# Patient Record
Sex: Male | Born: 1935
Health system: Southern US, Community
[De-identification: ages and names within clinical notes are randomized; demographics above are authoritative.]

## PROBLEM LIST (undated history)

## (undated) DIAGNOSIS — M109 Gout, unspecified: Secondary | ICD-10-CM

## (undated) DIAGNOSIS — C4499 Other specified malignant neoplasm of skin, unspecified: Secondary | ICD-10-CM

## (undated) DIAGNOSIS — G8929 Other chronic pain: Secondary | ICD-10-CM

## (undated) DIAGNOSIS — Z950 Presence of cardiac pacemaker: Secondary | ICD-10-CM

## (undated) DIAGNOSIS — I4821 Permanent atrial fibrillation: Secondary | ICD-10-CM

## (undated) DIAGNOSIS — M549 Dorsalgia, unspecified: Secondary | ICD-10-CM

## (undated) DIAGNOSIS — E785 Hyperlipidemia, unspecified: Secondary | ICD-10-CM

## (undated) DIAGNOSIS — I10 Essential (primary) hypertension: Secondary | ICD-10-CM

## (undated) DIAGNOSIS — G4733 Obstructive sleep apnea (adult) (pediatric): Secondary | ICD-10-CM

## (undated) DIAGNOSIS — M1990 Unspecified osteoarthritis, unspecified site: Secondary | ICD-10-CM

## (undated) DIAGNOSIS — Z9989 Dependence on other enabling machines and devices: Secondary | ICD-10-CM

## (undated) DIAGNOSIS — I4891 Unspecified atrial fibrillation: Principal | ICD-10-CM

## (undated) HISTORY — DX: Unspecified atrial fibrillation: I48.91

## (undated) HISTORY — PX: CHOLECYSTECTOMY: SHX55

## (undated) HISTORY — DX: Obstructive sleep apnea (adult) (pediatric): G47.33

## (undated) HISTORY — DX: Presence of cardiac pacemaker: Z95.0

## (undated) HISTORY — DX: Essential (primary) hypertension: I10

## (undated) HISTORY — DX: Permanent atrial fibrillation: I48.21

## (undated) HISTORY — PX: COLONOSCOPY: SHX174

## (undated) HISTORY — DX: Dependence on other enabling machines and devices: Z99.89

## (undated) HISTORY — PX: TONSILLECTOMY: SUR1361

## (undated) HISTORY — DX: Hyperlipidemia, unspecified: E78.5

---

## 2006-12-22 HISTORY — PX: GALLBLADDER SURGERY: SHX652

## 2007-05-22 HISTORY — PX: NM MYOCAR PERF WALL MOTION: HXRAD629

## 2007-11-24 HISTORY — PX: US ECHOCARDIOGRAPHY: HXRAD669

## 2009-01-09 HISTORY — PX: PERMANENT PACEMAKER INSERTION: SHX6023

## 2009-10-28 ENCOUNTER — Encounter: Admission: RE | Admit: 2009-10-28 | Discharge: 2009-10-28 | Payer: Self-pay | Admitting: Orthopedic Surgery

## 2010-07-23 HISTORY — PX: OTHER SURGICAL HISTORY: SHX169

## 2010-08-13 ENCOUNTER — Encounter: Payer: Self-pay | Admitting: Orthopedic Surgery

## 2011-08-14 DIAGNOSIS — R791 Abnormal coagulation profile: Secondary | ICD-10-CM | POA: Diagnosis not present

## 2011-08-14 DIAGNOSIS — I4891 Unspecified atrial fibrillation: Secondary | ICD-10-CM | POA: Diagnosis not present

## 2011-08-14 DIAGNOSIS — Z7901 Long term (current) use of anticoagulants: Secondary | ICD-10-CM | POA: Diagnosis not present

## 2011-08-27 DIAGNOSIS — I4891 Unspecified atrial fibrillation: Secondary | ICD-10-CM | POA: Diagnosis not present

## 2011-08-27 DIAGNOSIS — R791 Abnormal coagulation profile: Secondary | ICD-10-CM | POA: Diagnosis not present

## 2011-08-27 DIAGNOSIS — Z7901 Long term (current) use of anticoagulants: Secondary | ICD-10-CM | POA: Diagnosis not present

## 2011-09-06 DIAGNOSIS — Z7901 Long term (current) use of anticoagulants: Secondary | ICD-10-CM | POA: Diagnosis not present

## 2011-09-06 DIAGNOSIS — R791 Abnormal coagulation profile: Secondary | ICD-10-CM | POA: Diagnosis not present

## 2011-09-06 DIAGNOSIS — I4891 Unspecified atrial fibrillation: Secondary | ICD-10-CM | POA: Diagnosis not present

## 2011-09-28 DIAGNOSIS — R791 Abnormal coagulation profile: Secondary | ICD-10-CM | POA: Diagnosis not present

## 2011-09-28 DIAGNOSIS — I4891 Unspecified atrial fibrillation: Secondary | ICD-10-CM | POA: Diagnosis not present

## 2011-09-28 DIAGNOSIS — Z7901 Long term (current) use of anticoagulants: Secondary | ICD-10-CM | POA: Diagnosis not present

## 2011-10-12 DIAGNOSIS — R791 Abnormal coagulation profile: Secondary | ICD-10-CM | POA: Diagnosis not present

## 2011-10-12 DIAGNOSIS — Z7901 Long term (current) use of anticoagulants: Secondary | ICD-10-CM | POA: Diagnosis not present

## 2011-10-12 DIAGNOSIS — I4891 Unspecified atrial fibrillation: Secondary | ICD-10-CM | POA: Diagnosis not present

## 2011-10-22 DIAGNOSIS — I4891 Unspecified atrial fibrillation: Secondary | ICD-10-CM | POA: Diagnosis not present

## 2011-10-22 DIAGNOSIS — Z7901 Long term (current) use of anticoagulants: Secondary | ICD-10-CM | POA: Diagnosis not present

## 2011-10-22 DIAGNOSIS — R791 Abnormal coagulation profile: Secondary | ICD-10-CM | POA: Diagnosis not present

## 2011-11-01 DIAGNOSIS — Z45018 Encounter for adjustment and management of other part of cardiac pacemaker: Secondary | ICD-10-CM | POA: Diagnosis not present

## 2011-11-01 DIAGNOSIS — I119 Hypertensive heart disease without heart failure: Secondary | ICD-10-CM | POA: Diagnosis not present

## 2011-11-01 DIAGNOSIS — Z7901 Long term (current) use of anticoagulants: Secondary | ICD-10-CM | POA: Diagnosis not present

## 2011-11-01 DIAGNOSIS — I4891 Unspecified atrial fibrillation: Secondary | ICD-10-CM | POA: Diagnosis not present

## 2011-11-02 DIAGNOSIS — I119 Hypertensive heart disease without heart failure: Secondary | ICD-10-CM | POA: Diagnosis not present

## 2011-11-02 DIAGNOSIS — I4891 Unspecified atrial fibrillation: Secondary | ICD-10-CM | POA: Diagnosis not present

## 2011-11-02 DIAGNOSIS — E782 Mixed hyperlipidemia: Secondary | ICD-10-CM | POA: Diagnosis not present

## 2011-11-02 DIAGNOSIS — Z7901 Long term (current) use of anticoagulants: Secondary | ICD-10-CM | POA: Diagnosis not present

## 2011-11-08 DIAGNOSIS — L820 Inflamed seborrheic keratosis: Secondary | ICD-10-CM | POA: Diagnosis not present

## 2011-11-08 DIAGNOSIS — Z85828 Personal history of other malignant neoplasm of skin: Secondary | ICD-10-CM | POA: Diagnosis not present

## 2011-11-20 DIAGNOSIS — Z7901 Long term (current) use of anticoagulants: Secondary | ICD-10-CM | POA: Diagnosis not present

## 2011-11-20 DIAGNOSIS — I4891 Unspecified atrial fibrillation: Secondary | ICD-10-CM | POA: Diagnosis not present

## 2011-12-05 DIAGNOSIS — I4891 Unspecified atrial fibrillation: Secondary | ICD-10-CM | POA: Diagnosis not present

## 2011-12-05 DIAGNOSIS — Z7901 Long term (current) use of anticoagulants: Secondary | ICD-10-CM | POA: Diagnosis not present

## 2011-12-20 DIAGNOSIS — Z7901 Long term (current) use of anticoagulants: Secondary | ICD-10-CM | POA: Diagnosis not present

## 2011-12-20 DIAGNOSIS — I4891 Unspecified atrial fibrillation: Secondary | ICD-10-CM | POA: Diagnosis not present

## 2012-01-01 DIAGNOSIS — I4891 Unspecified atrial fibrillation: Secondary | ICD-10-CM | POA: Diagnosis not present

## 2012-01-01 DIAGNOSIS — Z7901 Long term (current) use of anticoagulants: Secondary | ICD-10-CM | POA: Diagnosis not present

## 2012-01-29 DIAGNOSIS — I4891 Unspecified atrial fibrillation: Secondary | ICD-10-CM | POA: Diagnosis not present

## 2012-01-29 DIAGNOSIS — Z7901 Long term (current) use of anticoagulants: Secondary | ICD-10-CM | POA: Diagnosis not present

## 2012-02-13 DIAGNOSIS — Z7901 Long term (current) use of anticoagulants: Secondary | ICD-10-CM | POA: Diagnosis not present

## 2012-02-13 DIAGNOSIS — I4891 Unspecified atrial fibrillation: Secondary | ICD-10-CM | POA: Diagnosis not present

## 2012-02-27 DIAGNOSIS — I4891 Unspecified atrial fibrillation: Secondary | ICD-10-CM | POA: Diagnosis not present

## 2012-02-27 DIAGNOSIS — Z7901 Long term (current) use of anticoagulants: Secondary | ICD-10-CM | POA: Diagnosis not present

## 2012-03-19 DIAGNOSIS — Z7901 Long term (current) use of anticoagulants: Secondary | ICD-10-CM | POA: Diagnosis not present

## 2012-03-19 DIAGNOSIS — I4891 Unspecified atrial fibrillation: Secondary | ICD-10-CM | POA: Diagnosis not present

## 2012-04-21 DIAGNOSIS — Z7901 Long term (current) use of anticoagulants: Secondary | ICD-10-CM | POA: Diagnosis not present

## 2012-04-21 DIAGNOSIS — I4891 Unspecified atrial fibrillation: Secondary | ICD-10-CM | POA: Diagnosis not present

## 2012-04-28 DIAGNOSIS — I83219 Varicose veins of right lower extremity with both ulcer of unspecified site and inflammation: Secondary | ICD-10-CM | POA: Diagnosis not present

## 2012-04-28 DIAGNOSIS — L97929 Non-pressure chronic ulcer of unspecified part of left lower leg with unspecified severity: Secondary | ICD-10-CM | POA: Diagnosis not present

## 2012-04-30 DIAGNOSIS — M5412 Radiculopathy, cervical region: Secondary | ICD-10-CM | POA: Diagnosis not present

## 2012-04-30 DIAGNOSIS — M4802 Spinal stenosis, cervical region: Secondary | ICD-10-CM | POA: Diagnosis not present

## 2012-04-30 DIAGNOSIS — M47812 Spondylosis without myelopathy or radiculopathy, cervical region: Secondary | ICD-10-CM | POA: Diagnosis not present

## 2012-04-30 DIAGNOSIS — G894 Chronic pain syndrome: Secondary | ICD-10-CM | POA: Diagnosis not present

## 2012-05-15 DIAGNOSIS — I4891 Unspecified atrial fibrillation: Secondary | ICD-10-CM | POA: Diagnosis not present

## 2012-05-15 DIAGNOSIS — Z7901 Long term (current) use of anticoagulants: Secondary | ICD-10-CM | POA: Diagnosis not present

## 2012-05-15 DIAGNOSIS — Z45018 Encounter for adjustment and management of other part of cardiac pacemaker: Secondary | ICD-10-CM | POA: Diagnosis not present

## 2012-05-20 DIAGNOSIS — I4891 Unspecified atrial fibrillation: Secondary | ICD-10-CM | POA: Diagnosis not present

## 2012-05-20 DIAGNOSIS — Z7901 Long term (current) use of anticoagulants: Secondary | ICD-10-CM | POA: Diagnosis not present

## 2012-06-03 DIAGNOSIS — H2510 Age-related nuclear cataract, unspecified eye: Secondary | ICD-10-CM | POA: Diagnosis not present

## 2012-06-03 DIAGNOSIS — D3130 Benign neoplasm of unspecified choroid: Secondary | ICD-10-CM | POA: Diagnosis not present

## 2012-06-03 DIAGNOSIS — H5200 Hypermetropia, unspecified eye: Secondary | ICD-10-CM | POA: Diagnosis not present

## 2012-06-03 DIAGNOSIS — H524 Presbyopia: Secondary | ICD-10-CM | POA: Diagnosis not present

## 2012-06-09 DIAGNOSIS — Z23 Encounter for immunization: Secondary | ICD-10-CM | POA: Diagnosis not present

## 2012-06-24 DIAGNOSIS — I4891 Unspecified atrial fibrillation: Secondary | ICD-10-CM | POA: Diagnosis not present

## 2012-06-24 DIAGNOSIS — Z7901 Long term (current) use of anticoagulants: Secondary | ICD-10-CM | POA: Diagnosis not present

## 2012-07-28 DIAGNOSIS — M19029 Primary osteoarthritis, unspecified elbow: Secondary | ICD-10-CM | POA: Diagnosis not present

## 2012-09-23 ENCOUNTER — Other Ambulatory Visit: Payer: Self-pay | Admitting: Physician Assistant

## 2012-09-23 ENCOUNTER — Ambulatory Visit
Admission: RE | Admit: 2012-09-23 | Discharge: 2012-09-23 | Disposition: A | Discharge status: Home / Self Care | Payer: BC Managed Care – PPO | Source: Ambulatory Visit | Attending: Physician Assistant | Admitting: Physician Assistant

## 2012-09-23 DIAGNOSIS — R52 Pain, unspecified: Secondary | ICD-10-CM

## 2012-09-23 DIAGNOSIS — G609 Hereditary and idiopathic neuropathy, unspecified: Secondary | ICD-10-CM | POA: Diagnosis not present

## 2012-09-23 DIAGNOSIS — G894 Chronic pain syndrome: Secondary | ICD-10-CM | POA: Diagnosis not present

## 2012-09-23 DIAGNOSIS — Z79899 Other long term (current) drug therapy: Secondary | ICD-10-CM | POA: Diagnosis not present

## 2012-09-23 DIAGNOSIS — M1990 Unspecified osteoarthritis, unspecified site: Secondary | ICD-10-CM | POA: Diagnosis not present

## 2012-09-23 DIAGNOSIS — M47817 Spondylosis without myelopathy or radiculopathy, lumbosacral region: Secondary | ICD-10-CM | POA: Diagnosis not present

## 2012-09-29 DIAGNOSIS — Z7901 Long term (current) use of anticoagulants: Secondary | ICD-10-CM | POA: Diagnosis not present

## 2012-09-29 DIAGNOSIS — I4891 Unspecified atrial fibrillation: Secondary | ICD-10-CM | POA: Diagnosis not present

## 2012-10-10 ENCOUNTER — Ambulatory Visit: Payer: Self-pay | Admitting: Cardiovascular Disease

## 2012-10-10 DIAGNOSIS — I4891 Unspecified atrial fibrillation: Secondary | ICD-10-CM

## 2012-10-10 DIAGNOSIS — Z7901 Long term (current) use of anticoagulants: Secondary | ICD-10-CM | POA: Insufficient documentation

## 2012-10-10 DIAGNOSIS — I4821 Permanent atrial fibrillation: Secondary | ICD-10-CM | POA: Insufficient documentation

## 2012-10-10 HISTORY — DX: Unspecified atrial fibrillation: I48.91

## 2012-10-13 DIAGNOSIS — M5137 Other intervertebral disc degeneration, lumbosacral region: Secondary | ICD-10-CM | POA: Diagnosis not present

## 2012-10-13 DIAGNOSIS — G894 Chronic pain syndrome: Secondary | ICD-10-CM | POA: Diagnosis not present

## 2012-10-13 DIAGNOSIS — IMO0002 Reserved for concepts with insufficient information to code with codable children: Secondary | ICD-10-CM | POA: Diagnosis not present

## 2012-10-13 DIAGNOSIS — M47817 Spondylosis without myelopathy or radiculopathy, lumbosacral region: Secondary | ICD-10-CM | POA: Diagnosis not present

## 2012-10-30 DIAGNOSIS — I4891 Unspecified atrial fibrillation: Secondary | ICD-10-CM | POA: Diagnosis not present

## 2012-10-30 DIAGNOSIS — G4733 Obstructive sleep apnea (adult) (pediatric): Secondary | ICD-10-CM | POA: Diagnosis not present

## 2012-11-04 ENCOUNTER — Ambulatory Visit
Admission: RE | Admit: 2012-11-04 | Discharge: 2012-11-04 | Disposition: A | Discharge status: Home / Self Care | Payer: Medicare Other | Source: Ambulatory Visit | Attending: Pain Medicine | Admitting: Pain Medicine

## 2012-11-04 ENCOUNTER — Other Ambulatory Visit: Payer: Self-pay | Admitting: Pain Medicine

## 2012-11-04 DIAGNOSIS — M169 Osteoarthritis of hip, unspecified: Secondary | ICD-10-CM | POA: Diagnosis not present

## 2012-11-04 DIAGNOSIS — M25551 Pain in right hip: Secondary | ICD-10-CM

## 2012-11-04 DIAGNOSIS — M25552 Pain in left hip: Secondary | ICD-10-CM

## 2012-11-04 DIAGNOSIS — M5137 Other intervertebral disc degeneration, lumbosacral region: Secondary | ICD-10-CM | POA: Diagnosis not present

## 2012-11-19 DIAGNOSIS — M5137 Other intervertebral disc degeneration, lumbosacral region: Secondary | ICD-10-CM | POA: Diagnosis not present

## 2012-11-27 DIAGNOSIS — M79609 Pain in unspecified limb: Secondary | ICD-10-CM | POA: Diagnosis not present

## 2012-11-27 DIAGNOSIS — M169 Osteoarthritis of hip, unspecified: Secondary | ICD-10-CM | POA: Diagnosis not present

## 2012-11-27 DIAGNOSIS — M47817 Spondylosis without myelopathy or radiculopathy, lumbosacral region: Secondary | ICD-10-CM | POA: Diagnosis not present

## 2012-11-27 DIAGNOSIS — G894 Chronic pain syndrome: Secondary | ICD-10-CM | POA: Diagnosis not present

## 2012-12-16 ENCOUNTER — Ambulatory Visit (INDEPENDENT_AMBULATORY_CARE_PROVIDER_SITE_OTHER): Payer: Medicare Other | Admitting: Pharmacist Clinician (PhC)/ Clinical Pharmacy Specialist

## 2012-12-16 DIAGNOSIS — Z7901 Long term (current) use of anticoagulants: Secondary | ICD-10-CM | POA: Diagnosis not present

## 2012-12-16 DIAGNOSIS — I4891 Unspecified atrial fibrillation: Secondary | ICD-10-CM | POA: Diagnosis not present

## 2012-12-16 LAB — POCT INR: INR: 2.6

## 2013-01-09 ENCOUNTER — Other Ambulatory Visit: Payer: Self-pay | Admitting: Cardiovascular Disease

## 2013-01-19 ENCOUNTER — Ambulatory Visit (INDEPENDENT_AMBULATORY_CARE_PROVIDER_SITE_OTHER): Payer: Self-pay | Admitting: Pharmacist Clinician (PhC)/ Clinical Pharmacy Specialist

## 2013-01-19 DIAGNOSIS — Z7901 Long term (current) use of anticoagulants: Secondary | ICD-10-CM | POA: Diagnosis not present

## 2013-01-19 DIAGNOSIS — I4891 Unspecified atrial fibrillation: Secondary | ICD-10-CM

## 2013-01-19 LAB — POCT INR: INR: 2.9

## 2013-02-10 ENCOUNTER — Ambulatory Visit (INDEPENDENT_AMBULATORY_CARE_PROVIDER_SITE_OTHER): Payer: Self-pay | Admitting: Pharmacist Clinician (PhC)/ Clinical Pharmacy Specialist

## 2013-02-10 DIAGNOSIS — I4891 Unspecified atrial fibrillation: Secondary | ICD-10-CM

## 2013-02-10 DIAGNOSIS — Z7901 Long term (current) use of anticoagulants: Secondary | ICD-10-CM

## 2013-02-10 LAB — POCT INR: INR: 2.6

## 2013-03-12 ENCOUNTER — Telehealth: Payer: Self-pay | Admitting: Pharmacist Clinician (PhC)/ Clinical Pharmacy Specialist

## 2013-03-12 ENCOUNTER — Ambulatory Visit (INDEPENDENT_AMBULATORY_CARE_PROVIDER_SITE_OTHER): Payer: Self-pay | Admitting: Pharmacist Clinician (PhC)/ Clinical Pharmacy Specialist

## 2013-03-12 DIAGNOSIS — Z7901 Long term (current) use of anticoagulants: Secondary | ICD-10-CM

## 2013-03-12 DIAGNOSIS — I4891 Unspecified atrial fibrillation: Secondary | ICD-10-CM

## 2013-03-12 NOTE — Telephone Encounter (Signed)
Patient's INR was 3.4 yesterday.  He did not take 1 pill last night.

## 2013-03-31 ENCOUNTER — Ambulatory Visit (INDEPENDENT_AMBULATORY_CARE_PROVIDER_SITE_OTHER): Payer: Medicare Other | Admitting: Pharmacist Clinician (PhC)/ Clinical Pharmacy Specialist

## 2013-03-31 DIAGNOSIS — I4891 Unspecified atrial fibrillation: Secondary | ICD-10-CM

## 2013-03-31 DIAGNOSIS — Z7901 Long term (current) use of anticoagulants: Secondary | ICD-10-CM

## 2013-03-31 LAB — POCT INR: INR: 3.1

## 2013-04-21 ENCOUNTER — Ambulatory Visit (INDEPENDENT_AMBULATORY_CARE_PROVIDER_SITE_OTHER): Payer: Medicare Other | Admitting: Pharmacist Clinician (PhC)/ Clinical Pharmacy Specialist

## 2013-04-21 ENCOUNTER — Telehealth: Payer: Self-pay | Admitting: Pharmacist Clinician (PhC)/ Clinical Pharmacy Specialist

## 2013-04-21 DIAGNOSIS — I4891 Unspecified atrial fibrillation: Secondary | ICD-10-CM | POA: Diagnosis not present

## 2013-04-21 DIAGNOSIS — Z7901 Long term (current) use of anticoagulants: Secondary | ICD-10-CM

## 2013-04-21 NOTE — Telephone Encounter (Signed)
His INR is 2.9

## 2013-04-25 ENCOUNTER — Encounter: Payer: Self-pay | Admitting: *Deleted

## 2013-04-28 ENCOUNTER — Ambulatory Visit (INDEPENDENT_AMBULATORY_CARE_PROVIDER_SITE_OTHER): Payer: Medicare Other | Admitting: Cardiovascular Disease

## 2013-04-28 ENCOUNTER — Encounter: Payer: Self-pay | Admitting: Cardiovascular Disease

## 2013-04-28 VITALS — BP 118/72 | HR 63 | Resp 16 | Ht 77.0 in | Wt 321.6 lb

## 2013-04-28 DIAGNOSIS — E669 Obesity, unspecified: Secondary | ICD-10-CM

## 2013-04-28 DIAGNOSIS — E782 Mixed hyperlipidemia: Secondary | ICD-10-CM

## 2013-04-28 DIAGNOSIS — Z79899 Other long term (current) drug therapy: Secondary | ICD-10-CM

## 2013-04-28 DIAGNOSIS — Z950 Presence of cardiac pacemaker: Secondary | ICD-10-CM | POA: Diagnosis not present

## 2013-04-28 DIAGNOSIS — G4733 Obstructive sleep apnea (adult) (pediatric): Secondary | ICD-10-CM

## 2013-04-28 DIAGNOSIS — R197 Diarrhea, unspecified: Secondary | ICD-10-CM

## 2013-04-28 DIAGNOSIS — I10 Essential (primary) hypertension: Secondary | ICD-10-CM

## 2013-04-28 DIAGNOSIS — I4891 Unspecified atrial fibrillation: Secondary | ICD-10-CM

## 2013-04-28 DIAGNOSIS — E785 Hyperlipidemia, unspecified: Secondary | ICD-10-CM

## 2013-04-28 LAB — PACEMAKER DEVICE OBSERVATION
BATTERY VOLTAGE: 2.79 V
BRDY-0002RV: 60 {beats}/min
DEVICE MODEL PM: 7043529
RV LEAD AMPLITUDE: 12 mv
RV LEAD THRESHOLD: 0.5 V

## 2013-04-28 MED ORDER — METOPROLOL TARTRATE 25 MG PO TABS: 12.5000 mg | ORAL_TABLET | Freq: Two times a day (BID) | ORAL | Status: DC

## 2013-04-28 MED ORDER — COUMADIN 5 MG PO TABS: ORAL_TABLET | ORAL | Status: DC

## 2013-04-28 MED ORDER — ATORVASTATIN CALCIUM 20 MG PO TABS: 20.0000 mg | ORAL_TABLET | Freq: Every day | ORAL | Status: DC

## 2013-04-28 MED ORDER — EZETIMIBE 10 MG PO TABS: 10.0000 mg | ORAL_TABLET | Freq: Every day | ORAL | Status: DC

## 2013-04-28 MED ORDER — VALSARTAN-HYDROCHLOROTHIAZIDE 160-12.5 MG PO TABS: 1.0000 | ORAL_TABLET | Freq: Every day | ORAL | Status: DC

## 2013-04-28 NOTE — Patient Instructions (Addendum)
Your physician has recommended you make the following change in your medication: Discontinue Zetia  Your physician recommends that you return for lab work in: 1 month (CMP/LIPID)  Your physician recommends that you schedule a follow-up appointment in: 6 MONTHS

## 2013-04-30 ENCOUNTER — Encounter: Payer: Self-pay | Admitting: Cardiovascular Disease

## 2013-05-01 ENCOUNTER — Encounter: Payer: Self-pay | Admitting: Cardiovascular Disease

## 2013-05-01 DIAGNOSIS — G4733 Obstructive sleep apnea (adult) (pediatric): Secondary | ICD-10-CM | POA: Insufficient documentation

## 2013-05-01 DIAGNOSIS — E785 Hyperlipidemia, unspecified: Secondary | ICD-10-CM | POA: Insufficient documentation

## 2013-05-01 DIAGNOSIS — I10 Essential (primary) hypertension: Secondary | ICD-10-CM

## 2013-05-01 DIAGNOSIS — Z950 Presence of cardiac pacemaker: Secondary | ICD-10-CM

## 2013-05-01 DIAGNOSIS — R197 Diarrhea, unspecified: Secondary | ICD-10-CM | POA: Insufficient documentation

## 2013-05-01 HISTORY — DX: Essential (primary) hypertension: I10

## 2013-05-01 HISTORY — DX: Presence of cardiac pacemaker: Z95.0

## 2013-05-01 NOTE — Assessment & Plan Note (Signed)
Weight loss recommended 

## 2013-05-01 NOTE — Assessment & Plan Note (Signed)
Pacemaker check in clinic. Threshold, sensing, and impedance consistent with previous measurements. Device programmed to maximize longevity. Permanent AF,  No high ventricular rates noted. Histogram distribution appropriate for patient activity level. Patient presented to the office with a polarity switch to unipolar sense/pulse. Testing was performed in both the unipolar and bipolar fashion and there were no problems with the bipolar function. Patient's device was reprogrammed back to its original bipolar configuration. V. auto cap was D/C'd and a permanent output of 2V at 0.13ms was programmed. Estimated longevity was increased to >10 years after changes. Patient will follow upin 6 months.

## 2013-05-01 NOTE — Assessment & Plan Note (Addendum)
On appropriate warfarin anti-coagulation. Anticoagulations levels have been quite steady. No bleeding complications. No history of stroke or TIA. Roughly 60% ventricular pacing due to slow ventricular response.

## 2013-05-01 NOTE — Assessment & Plan Note (Signed)
Theoretically could be related to treatment with aphakia. Will discontinue this medication and recheck a lipid profile. If diarrhea resolves I would not restart this medication. If diarrhea persists and lipid profile shows need for additional lipid lowering I would restart it. Labs ordered today to be performed in several weeks.

## 2013-05-01 NOTE — Assessment & Plan Note (Signed)
Repeat the lipid profile once Zetia effect dissipates.

## 2013-05-01 NOTE — Assessment & Plan Note (Signed)
Excellent control.   

## 2013-05-01 NOTE — Assessment & Plan Note (Signed)
Reports virtually 100% compliance with CPAP

## 2013-05-01 NOTE — Progress Notes (Signed)
Patient ID: Cameron Sharp, male   DOB: 1935/10/19, 77 y.o.   MRN: [id number]        Reason for office visit Atrial fibrillation, pacemaker, hyperlipidemia, hypertension  [name] returns for routine followup. He has permanent atrial fibrillation with slow ventricular response and a single chamber [employer] pacemaker. He has not had stroke/TIA or any bleeding complications while on chronic warfarin therapy. He has not had any major adverse health events, but has been plagued by persistent diarrhea for about a year. He wonders whether it could be medication related. He denies palpitations, syncope, dyspnea, dizziness, fatigue, chest pain, edema.  A full pacemaker check was performed in the office today. The device has switched to unipolar mode because of a faulty interpretation of pacing thresholds via the auto capture protocol. His ventricular pacing threshold is actually excellent. Also capture turned off. The device was reprogrammed to bipolar mode with fixed pacing output well above the threshold.      No Known Allergies  Current Outpatient Prescriptions  Medication Sig Dispense Refill  . atorvastatin (LIPITOR) 20 MG tablet Take 1 tablet (20 mg total) by mouth daily.  90 tablet  3  . COUMADIN 5 MG tablet Take one tablet daily or as directed.  90 tablet  2  . cyclobenzaprine (FLEXERIL) 5 MG tablet Take 5 mg by mouth at bedtime as needed for muscle spasms.      Marland Kitchen ezetimibe (ZETIA) 10 MG tablet Take 1 tablet (10 mg total) by mouth daily.  90 tablet  3  . gabapentin (NEURONTIN) 300 MG capsule Take 900 mg by mouth daily.       Marland Kitchen HYDROcodone-acetaminophen (NORCO) 10-325 MG per tablet Take 1 tablet by mouth every 6 (six) hours as needed for pain.      Marland Kitchen l-methylfolate-B6-B12 (METANX) 3-35-2 MG TABS Take 1 tablet by mouth daily.      . metoprolol tartrate (LOPRESSOR) 25 MG tablet Take 0.5 tablets (12.5 mg total) by mouth 2 (two) times daily.  90 tablet  3  . Thiamine HCl (VITAMIN B-1) 250  MG tablet Take 250 mg by mouth daily.      . valsartan-hydrochlorothiazide (DIOVAN-HCT) 160-12.5 MG per tablet Take 1 tablet by mouth daily.  90 tablet  3   No current facility-administered medications for this visit.    Past Medical History  Diagnosis Date  . Permanent atrial fibrillation   . Hypertension   . OSA on CPAP   . Hyperlipidemia   . Pacemaker -single-chamber [employer] Jude Zephyr 2010 05/01/2013  . Atrial fibrillation 10/10/2012  . HTN (hypertension) 05/01/2013    Past Surgical History  Procedure Laterality Date  . Gallbladder surgery  12/2006  . Permanent pacemaker insertion  01/09/2009    [employer].Jude  . US echocardiography  11/24/07    mild LVH,LA & RA mod to severely dilated,mild mitral annular ca+,mild TR,mild Pulmonary hypertensio,AOV mod. sclerotic,mild AI w/root dilatation and ca+.  . Nm myocar perf wall motion  05/22/07    no significant ischemia    Family History  Problem Relation Age of Onset  . Cancer Father     History   Social History  . Marital Status: Married    Spouse Name: N/A    Number of Children: N/A  . Years of Education: N/A   Occupational History  . Not on file.   Social History Main Topics  . Smoking status: Former Smoker    Quit date: 07/23/1971  . Smokeless tobacco: Not on file  .  Alcohol Use: Yes  . Drug Use: No  . Sexual Activity: Not on file   Other Topics Concern  . Not on file   Social History Narrative  . No narrative on file    Review of systems: The patient specifically denies any chest pain at rest or with exertion, dyspnea at rest or with exertion, orthopnea, paroxysmal nocturnal dyspnea, syncope, palpitations, focal neurological deficits, intermittent claudication, lower extremity edema, unexplained weight gain, cough, hemoptysis or wheezing.  The patient also denies abdominal pain, nausea, vomiting, dysphagia, constipation, polyuria, polydipsia, dysuria, hematuria, frequency, urgency, abnormal bleeding or bruising,  fever, chills, unexpected weight changes, mood swings, change in skin or hair texture, change in voice quality, auditory or visual problems, allergic reactions or rashes, new musculoskeletal complaints other than usual "aches and pains".   PHYSICAL EXAM BP 118/72  Pulse 63  Resp 16  Ht 6\' 5"  (1.956 m)  Wt 321 lb 9.6 oz (145.877 kg)  BMI 38.13 kg/m2  General: Alert, oriented x3, no distress, moderately obese Head: no evidence of trauma, PERRL, EOMI, no exophtalmos or lid lag, no myxedema, no xanthelasma; normal ears, nose and oropharynx Neck: normal jugular venous pulsations and no hepatojugular reflux; brisk carotid pulses without delay and no carotid bruits Chest: clear to auscultation, no signs of consolidation by percussion or palpation, normal fremitus, symmetrical and full respiratory excursions Cardiovascular: normal position and quality of the apical impulse, irregular rhythm, normal first and second heart sounds, no murmurs, rubs or gallops Abdomen: no tenderness or distention, no masses by palpation, no abnormal pulsatility or arterial bruits, normal bowel sounds, no hepatosplenomegaly Extremities: no clubbing, cyanosis or edema; 2+ radial, ulnar and brachial pulses bilaterally; 2+ right femoral, posterior tibial and dorsalis pedis pulses; 2+ left femoral, posterior tibial and dorsalis pedis pulses; no subclavian or femoral bruits Neurological: grossly nonfocal   EKG: Atrial fibrillation with intermittent ventricular pacing  Lipid Panel  Most recent triglycerides 105, cholesterol 161, HDL 49, LDL 91 but this is dated 2 2012  BMET No results found for this basename: na, k, cl, co2, glucose, bun, creatinine, calcium, gfrnonaa, gfraa     ASSESSMENT AND PLAN Atrial fibrillation On appropriate warfarin anti-coagulation. Anticoagulations levels have been quite steady. No bleeding complications. No history of stroke or TIA. Roughly 60% ventricular pacing due to slow ventricular  response.  Diarrhea Theoretically could be related to treatment with aphakia. Will discontinue this medication and recheck a lipid profile. If diarrhea resolves I would not restart this medication. If diarrhea persists and lipid profile shows need for additional lipid lowering I would restart it. Labs ordered today to be performed in several weeks.  Pacemaker -single-chamber St[employer] Jude Zephyr 2010 Pacemaker check in clinic. Threshold, sensing, and impedance consistent with previous measurements. Device programmed to maximize longevity. Permanent AF,  No high ventricular rates noted. Histogram distribution appropriate for patient activity level. Patient presented to the office with a polarity switch to unipolar sense/pulse. Testing was performed in both the unipolar and bipolar fashion and there were no problems with the bipolar function. Patient's device was reprogrammed back to its original bipolar configuration. V. auto cap was D/C'd and a permanent output of 2V at 0.5ms was programmed. Estimated longevity was increased to >10 years after changes. Patient will follow upin 6 months.   OSA on CPAP Reports virtually 100% compliance with CPAP  HTN (hypertension) Excellent control.  Obesity (BMI 30.0-34.9) Weight loss recommended.  Hyperlipidemia Repeat the lipid profile once Zetia effect dissipates.  Orders Placed This  Encounter  Procedures  . Comp Met (CMET)  . Lipid Profile  . Pacemaker Device Observation  . EKG 12-Lead   Meds ordered this encounter  Medications  . l-methylfolate-B6-B12 (METANX) 3-35-2 MG TABS    Sig: Take 1 tablet by mouth daily.  Marland Kitchen HYDROcodone-acetaminophen (NORCO) 10-325 MG per tablet    Sig: Take 1 tablet by mouth every 6 (six) hours as needed for pain.  . Thiamine HCl (VITAMIN B-1) 250 MG tablet    Sig: Take 250 mg by mouth daily.  Marland Kitchen atorvastatin (LIPITOR) 20 MG tablet    Sig: Take 1 tablet (20 mg total) by mouth daily.    Dispense:  90 tablet    Refill:  3    . ezetimibe (ZETIA) 10 MG tablet    Sig: Take 1 tablet (10 mg total) by mouth daily.    Dispense:  90 tablet    Refill:  3  . metoprolol tartrate (LOPRESSOR) 25 MG tablet    Sig: Take 0.5 tablets (12.5 mg total) by mouth 2 (two) times daily.    Dispense:  90 tablet    Refill:  3  . valsartan-hydrochlorothiazide (DIOVAN-HCT) 160-12.5 MG per tablet    Sig: Take 1 tablet by mouth daily.    Dispense:  90 tablet    Refill:  3  . COUMADIN 5 MG tablet    Sig: Take one tablet daily or as directed.    Dispense:  90 tablet    Refill:  2    Michell Kader  Thurmon Fair, MD, [facility] [phone] office [phone] pager

## 2013-05-04 ENCOUNTER — Encounter: Payer: Self-pay | Admitting: Cardiovascular Disease

## 2013-05-08 DIAGNOSIS — Z23 Encounter for immunization: Secondary | ICD-10-CM | POA: Diagnosis not present

## 2013-05-13 DIAGNOSIS — L723 Sebaceous cyst: Secondary | ICD-10-CM | POA: Diagnosis not present

## 2013-05-13 DIAGNOSIS — I8310 Varicose veins of unspecified lower extremity with inflammation: Secondary | ICD-10-CM | POA: Diagnosis not present

## 2013-05-13 DIAGNOSIS — D235 Other benign neoplasm of skin of trunk: Secondary | ICD-10-CM | POA: Diagnosis not present

## 2013-05-13 DIAGNOSIS — D239 Other benign neoplasm of skin, unspecified: Secondary | ICD-10-CM | POA: Diagnosis not present

## 2013-05-13 DIAGNOSIS — I8390 Asymptomatic varicose veins of unspecified lower extremity: Secondary | ICD-10-CM | POA: Diagnosis not present

## 2013-05-13 DIAGNOSIS — L909 Atrophic disorder of skin, unspecified: Secondary | ICD-10-CM | POA: Diagnosis not present

## 2013-05-13 DIAGNOSIS — L821 Other seborrheic keratosis: Secondary | ICD-10-CM | POA: Diagnosis not present

## 2013-05-19 ENCOUNTER — Ambulatory Visit (INDEPENDENT_AMBULATORY_CARE_PROVIDER_SITE_OTHER): Payer: Medicare Other | Admitting: Pharmacist Clinician (PhC)/ Clinical Pharmacy Specialist

## 2013-05-19 ENCOUNTER — Telehealth: Payer: Self-pay | Admitting: Pharmacist Clinician (PhC)/ Clinical Pharmacy Specialist

## 2013-05-19 ENCOUNTER — Telehealth: Payer: Self-pay | Admitting: Cardiovascular Disease

## 2013-05-19 DIAGNOSIS — I4891 Unspecified atrial fibrillation: Secondary | ICD-10-CM

## 2013-05-19 DIAGNOSIS — Z7901 Long term (current) use of anticoagulants: Secondary | ICD-10-CM

## 2013-05-19 LAB — POCT INR: INR: 1.4

## 2013-05-19 NOTE — Telephone Encounter (Signed)
Misty Stanley w/ [facility] reported critical INR 1.4 today.  Message forwarded to K. Alvstad, PharmD.

## 2013-05-19 NOTE — Telephone Encounter (Signed)
Cameron Sharp reported critical INR 1.4.  Informed result has been reported to our pharmacist.  Verbalized understanding.

## 2013-05-19 NOTE — Telephone Encounter (Signed)
INR was 1.4 this morning.

## 2013-05-20 DIAGNOSIS — M5137 Other intervertebral disc degeneration, lumbosacral region: Secondary | ICD-10-CM | POA: Diagnosis not present

## 2013-05-20 DIAGNOSIS — Z79899 Other long term (current) drug therapy: Secondary | ICD-10-CM | POA: Diagnosis not present

## 2013-05-20 DIAGNOSIS — M2550 Pain in unspecified joint: Secondary | ICD-10-CM | POA: Diagnosis not present

## 2013-05-20 DIAGNOSIS — G894 Chronic pain syndrome: Secondary | ICD-10-CM | POA: Diagnosis not present

## 2013-05-28 ENCOUNTER — Ambulatory Visit (INDEPENDENT_AMBULATORY_CARE_PROVIDER_SITE_OTHER): Payer: Medicare Other | Admitting: Pharmacist Clinician (PhC)/ Clinical Pharmacy Specialist

## 2013-05-28 ENCOUNTER — Other Ambulatory Visit: Payer: Self-pay

## 2013-05-28 DIAGNOSIS — I4891 Unspecified atrial fibrillation: Secondary | ICD-10-CM

## 2013-05-28 DIAGNOSIS — Z7901 Long term (current) use of anticoagulants: Secondary | ICD-10-CM

## 2013-05-28 NOTE — Telephone Encounter (Signed)
See anticoag note

## 2013-05-29 DIAGNOSIS — E782 Mixed hyperlipidemia: Secondary | ICD-10-CM | POA: Diagnosis not present

## 2013-05-29 DIAGNOSIS — Z79899 Other long term (current) drug therapy: Secondary | ICD-10-CM | POA: Diagnosis not present

## 2013-05-29 LAB — COMPREHENSIVE METABOLIC PANEL
AST: 25 U/L (ref 0–37)
Creat: 1.12 mg/dL (ref 0.50–1.35)
Potassium: 4.9 mEq/L (ref 3.5–5.3)
Sodium: 142 mEq/L (ref 135–145)
Total Bilirubin: 1 mg/dL (ref 0.3–1.2)

## 2013-05-29 LAB — LIPID PANEL
LDL Cholesterol: 76 mg/dL (ref 0–99)
Total CHOL/HDL Ratio: 3.1 Ratio
Triglycerides: 104 mg/dL (ref <150)

## 2013-06-01 DIAGNOSIS — R197 Diarrhea, unspecified: Secondary | ICD-10-CM | POA: Diagnosis not present

## 2013-06-02 ENCOUNTER — Telehealth: Payer: Self-pay | Admitting: Cardiology

## 2013-06-02 NOTE — Telephone Encounter (Signed)
Pt called upset about problems with his C-pap and with the fact that he was told the  C-pap company ([facility]  [phone] been calling our office and has not had any return calls. I told him I would forward this to Dr Landry Dyke nurse.   Corine Shelter PA-C 06/02/2013 7:28 PM

## 2013-06-03 ENCOUNTER — Telehealth: Payer: Self-pay | Admitting: *Deleted

## 2013-06-03 NOTE — Telephone Encounter (Signed)
Spoke with patient informing him the form for his CPAP supplies was faxed back to [employer]. Patient informs me that they also need copies of his sleep studies and last office note. I will copy these documents and send to them as well.

## 2013-06-09 ENCOUNTER — Telehealth: Payer: Self-pay | Admitting: Cardiovascular Disease

## 2013-06-09 NOTE — Telephone Encounter (Signed)
Message forwarded to W. Waddell, CMA.  

## 2013-06-09 NOTE — Telephone Encounter (Signed)
Pt called regarding his new CPAP machine. He stated that [facility] still has not received any of the following sleep study, order, and chart notes. The patient asked if that can be sent over to them and if there is an issue to please call him. He has been waiting for this machine for a while and he is trying to figure out what the hold up is.

## 2013-06-09 NOTE — Telephone Encounter (Signed)
Please have Burna Mortimer call him regarding his C Pac please.

## 2013-06-09 NOTE — Telephone Encounter (Signed)
Spoke with patient assuring him that I have already sent the requested information to [facility]. I will resend it again.

## 2013-06-19 LAB — POCT INR: INR: 1.7

## 2013-06-22 ENCOUNTER — Ambulatory Visit (INDEPENDENT_AMBULATORY_CARE_PROVIDER_SITE_OTHER): Payer: Medicare Other | Admitting: Pharmacist Clinician (PhC)/ Clinical Pharmacy Specialist

## 2013-06-22 DIAGNOSIS — I4891 Unspecified atrial fibrillation: Secondary | ICD-10-CM

## 2013-06-22 DIAGNOSIS — Z7901 Long term (current) use of anticoagulants: Secondary | ICD-10-CM

## 2013-07-20 ENCOUNTER — Telehealth: Payer: Self-pay | Admitting: Cardiovascular Disease

## 2013-07-20 ENCOUNTER — Ambulatory Visit (INDEPENDENT_AMBULATORY_CARE_PROVIDER_SITE_OTHER): Payer: Medicare Other | Admitting: Pharmacist Clinician (PhC)/ Clinical Pharmacy Specialist

## 2013-07-20 DIAGNOSIS — Z7901 Long term (current) use of anticoagulants: Secondary | ICD-10-CM

## 2013-07-20 DIAGNOSIS — I4891 Unspecified atrial fibrillation: Secondary | ICD-10-CM | POA: Diagnosis not present

## 2013-07-20 LAB — POCT INR: INR: 3.3

## 2013-07-20 NOTE — Telephone Encounter (Signed)
Message forwarded to K. Alvstad, PharmD.  

## 2013-07-20 NOTE — Telephone Encounter (Signed)
Calling to report his INR

## 2013-07-20 NOTE — Telephone Encounter (Signed)
See anticoag encounter

## 2013-07-24 ENCOUNTER — Encounter: Payer: Self-pay | Admitting: Cardiology

## 2013-07-24 ENCOUNTER — Ambulatory Visit (INDEPENDENT_AMBULATORY_CARE_PROVIDER_SITE_OTHER): Payer: Medicare Other | Admitting: Cardiology

## 2013-07-24 VITALS — BP 110/78 | HR 61 | Ht 76.0 in | Wt 310.2 lb

## 2013-07-24 DIAGNOSIS — I482 Chronic atrial fibrillation, unspecified: Secondary | ICD-10-CM

## 2013-07-24 DIAGNOSIS — I4891 Unspecified atrial fibrillation: Secondary | ICD-10-CM | POA: Diagnosis not present

## 2013-07-24 DIAGNOSIS — I10 Essential (primary) hypertension: Secondary | ICD-10-CM

## 2013-07-24 DIAGNOSIS — E669 Obesity, unspecified: Secondary | ICD-10-CM

## 2013-07-24 DIAGNOSIS — I4821 Permanent atrial fibrillation: Secondary | ICD-10-CM

## 2013-07-24 DIAGNOSIS — Z950 Presence of cardiac pacemaker: Secondary | ICD-10-CM | POA: Diagnosis not present

## 2013-07-24 DIAGNOSIS — L02419 Cutaneous abscess of limb, unspecified: Secondary | ICD-10-CM | POA: Diagnosis not present

## 2013-07-24 DIAGNOSIS — G4733 Obstructive sleep apnea (adult) (pediatric): Secondary | ICD-10-CM

## 2013-07-24 DIAGNOSIS — Z9989 Dependence on other enabling machines and devices: Secondary | ICD-10-CM

## 2013-07-24 DIAGNOSIS — L03119 Cellulitis of unspecified part of limb: Secondary | ICD-10-CM

## 2013-07-24 DIAGNOSIS — E785 Hyperlipidemia, unspecified: Secondary | ICD-10-CM

## 2013-07-24 DIAGNOSIS — L03116 Cellulitis of left lower limb: Secondary | ICD-10-CM | POA: Insufficient documentation

## 2013-07-24 MED ORDER — CEPHALEXIN 500 MG PO CAPS: 500.0000 mg | ORAL_CAPSULE | Freq: Four times a day (QID) | ORAL | Status: AC

## 2013-07-24 MED ORDER — CEPHALEXIN 500 MG PO CAPS: 500.0000 mg | ORAL_CAPSULE | Freq: Four times a day (QID) | ORAL | Status: DC

## 2013-07-24 NOTE — Assessment & Plan Note (Addendum)
Controlled.  

## 2013-07-24 NOTE — Patient Instructions (Signed)
Keflex 500 mg 4 times a day X 7 days. Keep appointment with Dr Claiborne Billings next week.

## 2013-07-24 NOTE — Progress Notes (Signed)
07/24/2013 Cameron Sharp   1936/03/15  [id number]  Primary Physicia [name], MD Primary Cardiologist: Dr Kelly/Dr Sallyanne Kuster  HPI:  The patient is a 78 year old gentleman with a longstanding history of permanent atrial fibrillation and slow ventricular response who has a single-chamber permanent pacemaker ([place]) that was implanted in 2010. He is on chronic anticoagulation. He has no history of CAD. He had a low risk Myoview in 2008 and an echo in 2009 revealed an EF of 45-50%. Additional problems include obesity, systemic hypertension, obstructive sleep apnea (compliant with CPAP) and hyperlipidemia. He has had problems with lower extremity edema that has been controlled with compression stockings. He presented to the office today for evaluation of Lt LE redness, pain, and erythema after a minor injury last week. The pt says he bumped his shin while he was at the [place] in [place] with his 68 y/o father. The site became red, warm, and tender. He denies any fever of chills.    Current Outpatient Prescriptions  Medication Sig Dispense Refill  . atorvastatin (LIPITOR) 20 MG tablet Take 1 tablet (20 mg total) by mouth daily.  90 tablet  3  . COUMADIN 5 MG tablet Take one tablet daily or as directed.  90 tablet  2  . gabapentin (NEURONTIN) 300 MG capsule Take 900 mg by mouth daily.       Marland Kitchen HYDROcodone-acetaminophen (NORCO) 10-325 MG per tablet Take 1 tablet by mouth every 6 (six) hours as needed for pain.      Marland Kitchen l-methylfolate-B6-B12 (METANX) 3-35-2 MG TABS Take 1 tablet by mouth daily.      . metoprolol tartrate (LOPRESSOR) 25 MG tablet Take 0.5 tablets (12.5 mg total) by mouth 2 (two) times daily.  90 tablet  3  . Thiamine HCl (VITAMIN B-1) 250 MG tablet Take 250 mg by mouth daily.      . valsartan-hydrochlorothiazide (DIOVAN-HCT) 160-12.5 MG per tablet Take 1 tablet by mouth daily.  90 tablet  3  . cephALEXin (KEFLEX) 500 MG capsule Take 1 capsule (500 mg total) by mouth 4 (four) times  daily.  28 capsule  0   No current facility-administered medications for this visit.    No Known Allergies  History   Social History  . Marital Status: Married    Spouse Name: N/A    Number of Children: N/A  . Years of Education: N/A   Occupational History  . Not on file.   Social History Main Topics  . Smoking status: Former Smoker    Quit date: 07/23/1971  . Smokeless tobacco: Not on file  . Alcohol Use: Yes  . Drug Use: No  . Sexual Activity: Not on file   Other Topics Concern  . Not on file   Social History Narrative  . No narrative on file     Review of Systems: General: negative for chills, fever, night sweats or weight changes.  Cardiovascular: negative for chest pain, dyspnea on exertion, edema, orthopnea, palpitations, paroxysmal nocturnal dyspnea or shortness of breath Dermatological: negative for rash Respiratory: negative for cough or wheezing Urologic: negative for hematuria Abdominal: negative for nausea, vomiting, diarrhea, bright red blood per rectum, melena, or hematemesis Neurologic: negative for visual changes, syncope, or dizziness All other systems reviewed and are otherwise negative except as noted above.    Blood pressure 110/78, pulse 61, height 6\' 4"  (1.93 m), weight 310 lb 3.2 oz (140.706 kg).  General appearance: alert, cooperative, no distress and moderately obese Lungs: clear to auscultation bilaterally  Heart: irregularly irregular rhythm Extremities: His Rt LE has chronic venous changes and skin discoloration. His Lt LE has errythema, warmth and is tender and slightly swollen.  EKG AF, POD  ASSESSMENT AND PLAN:   Cellulitis of left leg After minor injury last week  Long term (current) use of anticoagulants .  Permanent atrial fibrillation Rate controlled  Pacemaker -single-chamber [employer] Jude Zephyr 2010 .  HTN (hypertension) Controlled  Obesity, Class II, BMI 35-39.9 .  OSA on CPAP .  Hyperlipidemia LDL 76 Nov  2014   PLAN  Keflex 500 mg QID X 7 days. He has an appointment with Dr Claiborne Billings next week and his leg can be checked then.  [facility] KPA-C 07/24/2013 12:51 PM

## 2013-07-24 NOTE — Assessment & Plan Note (Signed)
Rate controlled 

## 2013-07-24 NOTE — Assessment & Plan Note (Signed)
After minor injury last week

## 2013-07-24 NOTE — Assessment & Plan Note (Signed)
LDL 76 Nov 2014

## 2013-07-27 ENCOUNTER — Ambulatory Visit (INDEPENDENT_AMBULATORY_CARE_PROVIDER_SITE_OTHER): Payer: Medicare Other | Admitting: Pharmacist Clinician (PhC)/ Clinical Pharmacy Specialist

## 2013-07-27 DIAGNOSIS — I4821 Permanent atrial fibrillation: Secondary | ICD-10-CM

## 2013-07-27 DIAGNOSIS — I4891 Unspecified atrial fibrillation: Secondary | ICD-10-CM

## 2013-07-27 DIAGNOSIS — Z7901 Long term (current) use of anticoagulants: Secondary | ICD-10-CM

## 2013-07-27 LAB — POCT INR: INR: 1.6

## 2013-07-29 ENCOUNTER — Encounter: Payer: Self-pay | Admitting: Cardiovascular Disease

## 2013-07-29 ENCOUNTER — Ambulatory Visit (INDEPENDENT_AMBULATORY_CARE_PROVIDER_SITE_OTHER): Payer: Medicare Other | Admitting: Cardiovascular Disease

## 2013-07-29 VITALS — BP 131/62 | HR 76 | Ht 75.0 in | Wt 310.0 lb

## 2013-07-29 DIAGNOSIS — I4891 Unspecified atrial fibrillation: Secondary | ICD-10-CM

## 2013-07-29 DIAGNOSIS — G4733 Obstructive sleep apnea (adult) (pediatric): Secondary | ICD-10-CM | POA: Diagnosis not present

## 2013-07-29 DIAGNOSIS — Z9989 Dependence on other enabling machines and devices: Secondary | ICD-10-CM

## 2013-07-29 DIAGNOSIS — I10 Essential (primary) hypertension: Secondary | ICD-10-CM

## 2013-07-29 DIAGNOSIS — E669 Obesity, unspecified: Secondary | ICD-10-CM

## 2013-07-29 DIAGNOSIS — Z950 Presence of cardiac pacemaker: Secondary | ICD-10-CM

## 2013-07-29 DIAGNOSIS — I4821 Permanent atrial fibrillation: Secondary | ICD-10-CM

## 2013-07-29 DIAGNOSIS — Z7901 Long term (current) use of anticoagulants: Secondary | ICD-10-CM

## 2013-07-29 DIAGNOSIS — E785 Hyperlipidemia, unspecified: Secondary | ICD-10-CM

## 2013-07-29 NOTE — Patient Instructions (Signed)
Your physician recommends that you schedule a follow-up appointment in: April or May. No changes were made today in your therapy.

## 2013-08-11 ENCOUNTER — Ambulatory Visit (INDEPENDENT_AMBULATORY_CARE_PROVIDER_SITE_OTHER): Payer: Medicare Other | Admitting: Pharmacist Clinician (PhC)/ Clinical Pharmacy Specialist

## 2013-08-11 DIAGNOSIS — I4891 Unspecified atrial fibrillation: Secondary | ICD-10-CM

## 2013-08-11 DIAGNOSIS — I4821 Permanent atrial fibrillation: Secondary | ICD-10-CM

## 2013-08-11 DIAGNOSIS — Z7901 Long term (current) use of anticoagulants: Secondary | ICD-10-CM

## 2013-08-11 LAB — POCT INR: INR: 2.8

## 2013-08-12 ENCOUNTER — Telehealth: Payer: Self-pay | Admitting: *Deleted

## 2013-08-12 DIAGNOSIS — Z79899 Other long term (current) drug therapy: Secondary | ICD-10-CM | POA: Diagnosis not present

## 2013-08-12 DIAGNOSIS — M5030 Other cervical disc degeneration, unspecified cervical region: Secondary | ICD-10-CM | POA: Diagnosis not present

## 2013-08-12 DIAGNOSIS — M5137 Other intervertebral disc degeneration, lumbosacral region: Secondary | ICD-10-CM | POA: Diagnosis not present

## 2013-08-12 DIAGNOSIS — G894 Chronic pain syndrome: Secondary | ICD-10-CM | POA: Diagnosis not present

## 2013-08-12 NOTE — Telephone Encounter (Signed)
Pt called stating that he needs a refill on Lipitor to be called into [facility] since there is no more refills allowed. TK

## 2013-08-13 MED ORDER — ATORVASTATIN CALCIUM 20 MG PO TABS: 20.0000 mg | ORAL_TABLET | Freq: Every day | ORAL | Status: DC

## 2013-08-13 NOTE — Telephone Encounter (Signed)
Done.  Rx was sent to pharmacy electronically.

## 2013-08-18 ENCOUNTER — Encounter: Payer: Self-pay | Admitting: Cardiovascular Disease

## 2013-08-18 NOTE — Progress Notes (Signed)
Patient ID: Cameron Sharp, male   DOB: 21-Mar-1936, 78 y.o.   MRN: [id number]     HPI: Cameron Sharp, is a 78 y.o. male who presents to sleep clinic for evaluation of his obstructive sleep apnea and followup of his new CPAP machine.  Cameron Sharp has a history of permanent atrial fibrillation, on home Coumadin therapy, hypertension, hyperlipidemia, and obesity. In July 2010 he underwent insertion of a [employer]. Jude permanent pacemaker. He also has a history of obstructive sleep apnea and has been on CPAP therapy for over 10 years. He has had weight fluctuations in the past had lost weight from a peak of 344 down to 271 pounds. Recently, he has gained some of this weight back and is now in the 310 weight range.  He denies recent episodes of chest pain. He denies PND orthopnea.  He was able to get a new machine for his CPAP therapy one month ago. This was done through [facility]. CPAP is set at 14 cm water pressure. Review of a download has demonstrated 100% compliance and he is using it greater than 8 hours per night. At 14 cm water pressure, AHI was 1.5/hr.  He denies hypersomnolence. He denies breakthrough snoring. There are no hypnagogic hallucinations.  He denies bruxism. He denies restless legs.  Epworth Sleepiness Scale: Situation   Chance of Dozing/Sleeping (0 = never , 1 = slight chance , 2 = moderate chance , 3 = high chance )   sitting and reading 0   watching TV 2   sitting inactive in a public place 0   being a passenger in a motor vehicle for an hour or more 0   lying down in the afternoon 1   sitting and talking to someone 0   sitting quietly after lunch (no alcohol) 0   while stopped for a few minutes in traffic as the driver 0   Total Score  3    Past Medical History  Diagnosis Date  . Permanent atrial fibrillation   . Hypertension   . OSA on CPAP   . Hyperlipidemia   . Pacemaker -[place] 2010 05/01/2013  . Atrial fibrillation 10/10/2012    . HTN (hypertension) 05/01/2013    Past Surgical History  Procedure Laterality Date  . Gallbladder surgery  12/2006  . Permanent pacemaker insertion  01/09/2009    [employer].Jude  . US echocardiography  11/24/07    mild LVH,LA & RA mod to severely dilated,mild mitral annular ca+,mild TR,mild Pulmonary hypertensio,AOV mod. sclerotic,mild AI w/root dilatation and ca+.  . Nm myocar perf wall motion  05/22/07    no significant ischemia    No Known Allergies  Current Outpatient Prescriptions  Medication Sig Dispense Refill  . COUMADIN 5 MG tablet Take one tablet daily or as directed.  90 tablet  2  . gabapentin (NEURONTIN) 300 MG capsule Take 900 mg by mouth daily.       Marland Kitchen HYDROcodone-acetaminophen (NORCO) 10-325 MG per tablet Take 1 tablet by mouth every 6 (six) hours as needed for pain.      Marland Kitchen L-Methylfolate-Algae-B12-B6 (METANX) 3-90.314-2-35 MG CAPS Take 1 capsule by mouth daily.      . metoprolol tartrate (LOPRESSOR) 25 MG tablet Take 0.5 tablets (12.5 mg total) by mouth 2 (two) times daily.  90 tablet  3  . Thiamine HCl (VITAMIN B-1) 250 MG tablet Take 250 mg by mouth daily.      . valsartan-hydrochlorothiazide (DIOVAN-HCT) 160-12.5 MG per tablet Take  1 tablet by mouth daily.  90 tablet  3  . ZETIA 10 MG tablet Take 1 tablet by mouth daily.      Marland Kitchen atorvastatin (LIPITOR) 20 MG tablet Take 1 tablet (20 mg total) by mouth daily.  90 tablet  1   No current facility-administered medications for this visit.   Socially he is married has 3 children and 7 grandchildren. In the past he used to go to [place] to span the entire summer. More recently he has been going to [place]. He does exercise 4-5 days per week.  ROS negative for fever, chills or night sweats. He denies change in vision or hearing. He is unaware of lymphadenopathy. He denies wheezing. There is no PND or orthopnea. He is unaware of palpitations. He denies exertional chest pressure. He denies abdominal pain, nausea vomiting or diarrhea. He  denies blood in stool or urine. There is no claudication. He denies bleeding on Coumadin therapy. He denies rash. He does have some neuropathy for which he takes Neurontin. Sleep system review is as above. Other comprehensive 14 point system review is negative.  PE BP 131/62  Pulse 76  Ht 6\' 3"  (1.905 m)  Wt 310 lb (140.615 kg)  BMI 38.75 kg/m2  General: Alert, oriented, no distress.  Skin: normal turgor, no rashes HEENT: Normocephalic, atraumatic. Pupils round and reactive; sclera anicteric; extraocular muscles intact; Fundi no hemorrhages or exudates. No xanthelasmas. Nose without nasal septal hypertrophy Mouth/Parynx benign; Mallinpatti scale 3 Neck: No JVD, no carotid briuts Lungs: clear to ausculatation and percussion; no wheezing or rales  Chest wall: No tenderness to palpation Heart: Irregularly irregular with a controlled rate in the 70s, s1 s2 normal; 1/6 systolic murmur Abdomen: Mild diastases recti soft, nontender; no hepatosplenomehaly, BS+; abdominal aorta nontender and not dilated by palpation. Back: No CVA tenderness Pulses 2+ Extremities: Mild venous stasis changes to his ankles; no clubbinbg cyanosis, Homan's sign negative  Neurologic: grossly nonfocal; cranial nerves intact. Psychological: Normal affect and mood.   LABS:  BMET    Component Value Date/Time   NA 142 05/29/2013 0921   K 4.9 05/29/2013 0921   CL 105 05/29/2013 0921   CO2 31 05/29/2013 0921   GLUCOSE 109* 05/29/2013 0921   BUN 20 05/29/2013 0921   CREATININE 1.12 05/29/2013 0921   CALCIUM 9.9 05/29/2013 0921     Hepatic Function Panel     Component Value Date/Time   PROT 7.0 05/29/2013 0921   ALBUMIN 4.3 05/29/2013 0921   AST 25 05/29/2013 0921   ALT 14 05/29/2013 0921   ALKPHOS 47 05/29/2013 0921   BILITOT 1.0 05/29/2013 0921     CBC No results found for this basename: wbc, rbc, hgb, hct, plt, mcv, mch, mchc, rdw, neutrabs, lymphsabs, monoabs, eosabs, basosabs     BNP No results found for  this basename: probnp    Lipid Panel     Component Value Date/Time   CHOL 144 05/29/2013 0921   TRIG 104 05/29/2013 0921   HDL 47 05/29/2013 0921   CHOLHDL 3.1 05/29/2013 0921   VLDL 21 05/29/2013 0921   LDLCALC 76 05/29/2013 0921     RADIOLOGY: No results found.    ASSESSMENT AND PLAN: Cameron Sharp is a 78 year old gentleman with a history of permanent atrial fibrillation on chronic Coumadin therapy. He does have significant obstructive sleep apnea and has been utilizing CPAP in excess of 10 years. He has sick sinus syndrome and is status post permanent pacemaker implantation with excellent  function. His blood pressure has remained stable. His weight has been somewhat labile but he is still approximately 30 pounds less than his peak weight but approximately 30-40 pounds more than his optimal weight loss. He now has a new CPAP unit machine [facility]. We did obtain data today which demonstrates excellent compliance and use her sleep duration of 8 hours and 33 minutes. His AHI is excellent 1.5 per hour at 14 cm water pressure. His atrial fibrillation rate is well-controlled. He's not having significant edema. He is on atorvastatin and Zetia for his hyperlipidemia. He will be going to [place] for the entire summer. Prior to his departure I will see him in the office for followup evaluation.     Troy Sine, MD, [facility]  08/18/2013 4:52 PM

## 2013-09-16 ENCOUNTER — Ambulatory Visit (INDEPENDENT_AMBULATORY_CARE_PROVIDER_SITE_OTHER): Payer: Medicare Other | Admitting: Pharmacist Clinician (PhC)/ Clinical Pharmacy Specialist

## 2013-09-16 DIAGNOSIS — I4821 Permanent atrial fibrillation: Secondary | ICD-10-CM

## 2013-09-16 DIAGNOSIS — Z7901 Long term (current) use of anticoagulants: Secondary | ICD-10-CM

## 2013-09-16 DIAGNOSIS — I4891 Unspecified atrial fibrillation: Secondary | ICD-10-CM

## 2013-09-16 LAB — POCT INR: INR: 2

## 2013-09-22 ENCOUNTER — Telehealth: Payer: Self-pay | Admitting: *Deleted

## 2013-09-22 NOTE — Telephone Encounter (Signed)
Returned signed sleep compliance letter for visit dated 07/29/13.

## 2013-09-24 DIAGNOSIS — I4891 Unspecified atrial fibrillation: Secondary | ICD-10-CM | POA: Diagnosis not present

## 2013-09-24 DIAGNOSIS — Z7901 Long term (current) use of anticoagulants: Secondary | ICD-10-CM | POA: Diagnosis not present

## 2013-09-30 ENCOUNTER — Ambulatory Visit (INDEPENDENT_AMBULATORY_CARE_PROVIDER_SITE_OTHER): Payer: Medicare Other | Admitting: Pharmacist Clinician (PhC)/ Clinical Pharmacy Specialist

## 2013-09-30 DIAGNOSIS — I4891 Unspecified atrial fibrillation: Secondary | ICD-10-CM

## 2013-09-30 DIAGNOSIS — I4821 Permanent atrial fibrillation: Secondary | ICD-10-CM

## 2013-09-30 DIAGNOSIS — Z7901 Long term (current) use of anticoagulants: Secondary | ICD-10-CM

## 2013-09-30 LAB — POCT INR: INR: 2.2

## 2013-10-09 ENCOUNTER — Telehealth: Payer: Self-pay | Admitting: *Deleted

## 2013-10-09 NOTE — Telephone Encounter (Signed)
Faxed supply order back.

## 2013-10-20 ENCOUNTER — Ambulatory Visit (INDEPENDENT_AMBULATORY_CARE_PROVIDER_SITE_OTHER): Payer: Medicare Other | Admitting: Pharmacist Clinician (PhC)/ Clinical Pharmacy Specialist

## 2013-10-20 DIAGNOSIS — I4821 Permanent atrial fibrillation: Secondary | ICD-10-CM

## 2013-10-20 DIAGNOSIS — Z7901 Long term (current) use of anticoagulants: Secondary | ICD-10-CM

## 2013-10-20 DIAGNOSIS — I4891 Unspecified atrial fibrillation: Secondary | ICD-10-CM

## 2013-10-20 LAB — POCT INR: INR: 2.4

## 2013-11-10 ENCOUNTER — Other Ambulatory Visit: Payer: Self-pay | Admitting: Physical Medicine and Rehabilitation

## 2013-11-10 ENCOUNTER — Ambulatory Visit
Admission: RE | Admit: 2013-11-10 | Discharge: 2013-11-10 | Disposition: A | Discharge status: Home / Self Care | Payer: Medicare Other | Source: Ambulatory Visit | Attending: Physical Medicine and Rehabilitation | Admitting: Physical Medicine and Rehabilitation

## 2013-11-10 DIAGNOSIS — M47817 Spondylosis without myelopathy or radiculopathy, lumbosacral region: Secondary | ICD-10-CM | POA: Diagnosis not present

## 2013-11-10 DIAGNOSIS — IMO0002 Reserved for concepts with insufficient information to code with codable children: Secondary | ICD-10-CM | POA: Diagnosis not present

## 2013-11-10 DIAGNOSIS — R52 Pain, unspecified: Secondary | ICD-10-CM

## 2013-11-10 DIAGNOSIS — G894 Chronic pain syndrome: Secondary | ICD-10-CM | POA: Diagnosis not present

## 2013-11-11 DIAGNOSIS — G894 Chronic pain syndrome: Secondary | ICD-10-CM | POA: Diagnosis not present

## 2013-11-11 DIAGNOSIS — M5137 Other intervertebral disc degeneration, lumbosacral region: Secondary | ICD-10-CM | POA: Diagnosis not present

## 2013-11-11 DIAGNOSIS — IMO0001 Reserved for inherently not codable concepts without codable children: Secondary | ICD-10-CM | POA: Diagnosis not present

## 2013-11-11 DIAGNOSIS — Z79899 Other long term (current) drug therapy: Secondary | ICD-10-CM | POA: Diagnosis not present

## 2013-11-13 ENCOUNTER — Ambulatory Visit (INDEPENDENT_AMBULATORY_CARE_PROVIDER_SITE_OTHER): Payer: Medicare Other | Admitting: Cardiovascular Disease

## 2013-11-13 ENCOUNTER — Encounter: Payer: Self-pay | Admitting: Cardiovascular Disease

## 2013-11-13 VITALS — BP 136/70 | HR 66 | Resp 16 | Ht 76.0 in | Wt 319.0 lb

## 2013-11-13 DIAGNOSIS — E782 Mixed hyperlipidemia: Secondary | ICD-10-CM

## 2013-11-13 DIAGNOSIS — R5383 Other fatigue: Secondary | ICD-10-CM

## 2013-11-13 DIAGNOSIS — I10 Essential (primary) hypertension: Secondary | ICD-10-CM

## 2013-11-13 DIAGNOSIS — B351 Tinea unguium: Secondary | ICD-10-CM | POA: Diagnosis not present

## 2013-11-13 DIAGNOSIS — Z9989 Dependence on other enabling machines and devices: Secondary | ICD-10-CM

## 2013-11-13 DIAGNOSIS — Z79899 Other long term (current) drug therapy: Secondary | ICD-10-CM | POA: Diagnosis not present

## 2013-11-13 DIAGNOSIS — L600 Ingrowing nail: Secondary | ICD-10-CM | POA: Diagnosis not present

## 2013-11-13 DIAGNOSIS — I4821 Permanent atrial fibrillation: Secondary | ICD-10-CM

## 2013-11-13 DIAGNOSIS — Z950 Presence of cardiac pacemaker: Secondary | ICD-10-CM

## 2013-11-13 DIAGNOSIS — E669 Obesity, unspecified: Secondary | ICD-10-CM

## 2013-11-13 DIAGNOSIS — I4891 Unspecified atrial fibrillation: Secondary | ICD-10-CM | POA: Diagnosis not present

## 2013-11-13 DIAGNOSIS — M25579 Pain in unspecified ankle and joints of unspecified foot: Secondary | ICD-10-CM | POA: Diagnosis not present

## 2013-11-13 DIAGNOSIS — R5381 Other malaise: Secondary | ICD-10-CM | POA: Diagnosis not present

## 2013-11-13 DIAGNOSIS — E785 Hyperlipidemia, unspecified: Secondary | ICD-10-CM

## 2013-11-13 DIAGNOSIS — G4733 Obstructive sleep apnea (adult) (pediatric): Secondary | ICD-10-CM

## 2013-11-13 LAB — PACEMAKER DEVICE OBSERVATION

## 2013-11-13 LAB — MDC_IDC_ENUM_SESS_TYPE_INCLINIC
Brady Statistic RV Percent Paced: 62 %
Implantable Pulse Generator Model: 5626
Lead Channel Impedance Value: 782 Ohm
Lead Channel Pacing Threshold Amplitude: 0.5 V
Lead Channel Pacing Threshold Pulse Width: 0.4 ms
Lead Channel Sensing Intrinsic Amplitude: 12 mV
Lead Channel Setting Pacing Amplitude: 2 V
Lead Channel Setting Pacing Pulse Width: 0.4 ms
MDC IDC MSMT BATTERY IMPEDANCE: 1000 Ohm — AB
MDC IDC MSMT BATTERY VOLTAGE: 2.79 V
MDC IDC PG SERIAL: 7043529
MDC IDC SESS DTM: 20150424155816
MDC IDC SET LEADCHNL RV SENSING SENSITIVITY: 2 mV

## 2013-11-13 NOTE — Patient Instructions (Signed)
Your physician recommends that you return for lab work in: [place] at your convenience at [facility].  Dr. Sallyanne Kuster recommends that you schedule a follow-up appointment in: 6 months for an office visit and pacemaker interrogation.

## 2013-11-14 NOTE — Progress Notes (Signed)
Patient ID: Cameron Sharp, male   DOB: 1936-07-08, 78 y.o.   MRN: [id number]      Reason for office visit Pacemaker check, permanent atrial fibrillation  Cameron Sharp is here for pacemaker followup and follow for atrial fibrillation, hypertension and hyperlipidemia. He has obstructive sleep apnea on CPAP therapy for which he has seen Dr. Ellouise Newer. He has permanent atrial fibrillation with slow ventricular response and a single chamber St. Jude pacemaker, implanted in 2010. He has not had stroke/TIA or any bleeding complications while on chronic warfarin therapy. Most echocardiography in 2009 showed an ejection fraction of 45-50%. He has never had clinical congestive heart failure. And normal nuclear perfusion study in the past. He does not have known coronary disease.   He has no complaints today.  Pacemaker function is normal. Or capture has led to excessively high pacing outputs in the past so he is on a fixed pacing output. Manual testing was threshold is 0.5 V 0.4 ms pulse width. There is excellent sensing and lead impedance. Battery voltage is 2.79 V with an estimated longevity of 8-10 years. Has 62% ventricular pacing, similar to previous trends   No Known Allergies  Current Outpatient Prescriptions  Medication Sig Dispense Refill  . atorvastatin (LIPITOR) 20 MG tablet Take 1 tablet (20 mg total) by mouth daily.  90 tablet  1  . COUMADIN 5 MG tablet Take one tablet daily or as directed.  90 tablet  2  . gabapentin (NEURONTIN) 300 MG capsule Take 900 mg by mouth daily.       Marland Kitchen HYDROcodone-acetaminophen (NORCO) 10-325 MG per tablet Take 1 tablet by mouth every 6 (six) hours as needed for pain.      Marland Kitchen L-Methylfolate-Algae-B12-B6 (METANX) 3-90.314-2-35 MG CAPS Take 1 capsule by mouth daily.      . metoprolol tartrate (LOPRESSOR) 25 MG tablet Take 0.5 tablets (12.5 mg total) by mouth 2 (two) times daily.  90 tablet  3  . Thiamine HCl (VITAMIN B-1) 250 MG tablet Take 250 mg by mouth daily.       . valsartan-hydrochlorothiazide (DIOVAN-HCT) 160-12.5 MG per tablet Take 1 tablet by mouth daily.  90 tablet  3   No current facility-administered medications for this visit.    Past Medical History  Diagnosis Date  . Permanent atrial fibrillation   . Hypertension   . OSA on CPAP   . Hyperlipidemia   . Pacemaker -[place] 2010 05/01/2013  . Atrial fibrillation 10/10/2012  . HTN (hypertension) 05/01/2013    Past Surgical History  Procedure Laterality Date  . Gallbladder surgery  12/2006  . Permanent pacemaker insertion  01/09/2009    St.Jude  . US echocardiography  11/24/07    mild LVH,LA & RA mod to severely dilated,mild mitral annular ca+,mild TR,mild Pulmonary hypertensio,AOV mod. sclerotic,mild AI w/root dilatation and ca+.  . Nm myocar perf wall motion  05/22/07    no significant ischemia    Family History  Problem Relation Age of Onset  . Cancer Father     History   Social History  . Marital Status: Married    Spouse Name: N/A    Number of Children: N/A  . Years of Education: N/A   Occupational History  . Not on file.   Social History Main Topics  . Smoking status: Former Smoker    Quit date: 07/23/1971  . Smokeless tobacco: Not on file  . Alcohol Use: Yes  . Drug Use: No  . Sexual Activity: Not  on file   Other Topics Concern  . Not on file   Social History Narrative  . No narrative on file    Review of systems: The patient specifically denies any chest pain at rest or with exertion, dyspnea at rest or with exertion, orthopnea, paroxysmal nocturnal dyspnea, syncope, palpitations, focal neurological deficits, intermittent claudication, lower extremity edema, unexplained weight gain, cough, hemoptysis or wheezing.  The patient also denies abdominal pain, nausea, vomiting, dysphagia, diarrhea, constipation, polyuria, polydipsia, dysuria, hematuria, frequency, urgency, abnormal bleeding or bruising, fever, chills, unexpected weight  changes, mood swings, change in skin or hair texture, change in voice quality, auditory or visual problems, allergic reactions or rashes, new musculoskeletal complaints other than usual "aches and pains".   PHYSICAL EXAM BP 136/70  Pulse 66  Resp 16  Ht 6\' 4"  (1.93 m)  Wt 319 lb (144.697 kg)  BMI 38.85 kg/m2  General: Alert, oriented x3, no distress, severe obesity limits evaluation Head: no evidence of trauma, PERRL, EOMI, no exophtalmos or lid lag, no myxedema, no xanthelasma; normal ears, nose and oropharynx Neck: normal jugular venous pulsations and no hepatojugular reflux; brisk carotid pulses without delay and no carotid bruits Chest: clear to auscultation, no signs of consolidation by percussion or palpation, normal fremitus, symmetrical and full respiratory excursions Cardiovascular: normal position and quality of the apical impulse, regular rhythm, normal first and paradoxically second heart sounds, no murmurs, rubs or gallops Abdomen: no tenderness or distention, no masses by palpation, no abnormal pulsatility or arterial bruits, normal bowel sounds, no hepatosplenomegaly Extremities: no clubbing, cyanosis or edema; 2+ radial, ulnar and brachial pulses bilaterally; 2+ right femoral, posterior tibial and dorsalis pedis pulses; 2+ left femoral, posterior tibial and dorsalis pedis pulses; no subclavian or femoral bruits Neurological: grossly nonfocal   EKG: Atrial Fibrillation, ventricular pacing  Lipid Panel     Component Value Date/Time   CHOL 144 05/29/2013 0921   TRIG 104 05/29/2013 0921   HDL 47 05/29/2013 0921   CHOLHDL 3.1 05/29/2013 0921   VLDL 21 05/29/2013 0921   LDLCALC 76 05/29/2013 0921    BMET    Component Value Date/Time   NA 142 05/29/2013 0921   K 4.9 05/29/2013 0921   CL 105 05/29/2013 0921   CO2 31 05/29/2013 0921   GLUCOSE 109* 05/29/2013 0921   BUN 20 05/29/2013 0921   CREATININE 1.12 05/29/2013 0921   CALCIUM 9.9 05/29/2013 0921     ASSESSMENT AND  PLAN No problem-specific assessment & plan notes found for this encounter. Atrial fibrillation  On appropriate warfarin anti-coagulation. Anticoagulations levels have been quite steady. No bleeding complications. No history of stroke or TIA. Roughly 60% ventricular pacing due to slow ventricular response.    Pacemaker -[place] 2010  Pacemaker check in clinic. Threshold, sensing, and impedance consistent with previous measurements. Device programmed to maximize longevity. Permanent AF, No high ventricular rates noted. Histogram distribution appropriate for patient activity level. Patient presented to the office with a polarity switch to unipolar sense/pulse. Testing was performed in both the unipolar and bipolar fashion and there were no problems with the bipolar function. Patient's device was reprogrammed back to its original bipolar configuration. V. auto cap was D/C'd and a permanent output of 2V at 0.79ms was programmed. Estimated longevity was increased to >10 years after changes. Patient will follow upin 6 months.  OSA on CPAP  Reports virtually 100% compliance with CPAP  HTN (hypertension)  Excellent control.  Obesity (BMI 30.0-34.9)  Weight loss  recommended.  Hyperlipidemia  Next number lipid profile even without Zetia   Orders Placed This Encounter  Procedures  . CBC  . Comprehensive metabolic panel  . Lipid panel  . Implantable device check   No orders of the defined types were placed in this encounter.    Domnic Vantol  Sanda Klein, MD, [facility] [phone] office [phone] pager

## 2013-11-18 LAB — POCT INR: INR: 2.5

## 2013-11-19 ENCOUNTER — Ambulatory Visit (INDEPENDENT_AMBULATORY_CARE_PROVIDER_SITE_OTHER): Payer: Medicare Other | Admitting: Pharmacist Clinician (PhC)/ Clinical Pharmacy Specialist

## 2013-11-19 DIAGNOSIS — M47817 Spondylosis without myelopathy or radiculopathy, lumbosacral region: Secondary | ICD-10-CM | POA: Diagnosis not present

## 2013-11-19 DIAGNOSIS — Z79899 Other long term (current) drug therapy: Secondary | ICD-10-CM | POA: Diagnosis not present

## 2013-11-19 DIAGNOSIS — I4891 Unspecified atrial fibrillation: Secondary | ICD-10-CM

## 2013-11-19 DIAGNOSIS — R5381 Other malaise: Secondary | ICD-10-CM | POA: Diagnosis not present

## 2013-11-19 DIAGNOSIS — Z7901 Long term (current) use of anticoagulants: Secondary | ICD-10-CM

## 2013-11-19 DIAGNOSIS — E782 Mixed hyperlipidemia: Secondary | ICD-10-CM | POA: Diagnosis not present

## 2013-11-19 DIAGNOSIS — I4821 Permanent atrial fibrillation: Secondary | ICD-10-CM

## 2013-11-19 LAB — COMPREHENSIVE METABOLIC PANEL
ALT: 13 U/L (ref 0–53)
AST: 23 U/L (ref 0–37)
Albumin: 4.2 g/dL (ref 3.5–5.2)
Alkaline Phosphatase: 39 U/L (ref 39–117)
BUN: 18 mg/dL (ref 6–23)
CALCIUM: 8.9 mg/dL (ref 8.4–10.5)
CHLORIDE: 103 meq/L (ref 96–112)
CO2: 28 meq/L (ref 19–32)
Creat: 0.96 mg/dL (ref 0.50–1.35)
Glucose, Bld: 95 mg/dL (ref 70–99)
POTASSIUM: 4.1 meq/L (ref 3.5–5.3)
Sodium: 139 mEq/L (ref 135–145)
Total Bilirubin: 0.8 mg/dL (ref 0.2–1.2)
Total Protein: 6.6 g/dL (ref 6.0–8.3)

## 2013-11-19 LAB — CBC
HCT: 44.2 % (ref 39.0–52.0)
Hemoglobin: 15.2 g/dL (ref 13.0–17.0)
MCH: 32.8 pg (ref 26.0–34.0)
MCHC: 34.4 g/dL (ref 30.0–36.0)
MCV: 95.3 fL (ref 78.0–100.0)
Platelets: 191 10*3/uL (ref 150–400)
RBC: 4.64 MIL/uL (ref 4.22–5.81)
RDW: 14.7 % (ref 11.5–15.5)
WBC: 5.9 10*3/uL (ref 4.0–10.5)

## 2013-11-19 LAB — LIPID PANEL
CHOLESTEROL: 155 mg/dL (ref 0–200)
HDL: 53 mg/dL (ref >39)
LDL Cholesterol: 81 mg/dL (ref 0–99)
Total CHOL/HDL Ratio: 2.9 Ratio
Triglycerides: 104 mg/dL (ref <150)
VLDL: 21 mg/dL (ref 0–40)

## 2013-11-20 ENCOUNTER — Encounter: Payer: Self-pay | Admitting: Cardiovascular Disease

## 2013-11-20 ENCOUNTER — Ambulatory Visit (INDEPENDENT_AMBULATORY_CARE_PROVIDER_SITE_OTHER): Payer: Medicare Other | Admitting: Cardiovascular Disease

## 2013-11-20 VITALS — BP 138/68 | HR 65 | Ht 76.0 in | Wt 323.7 lb

## 2013-11-20 DIAGNOSIS — G4733 Obstructive sleep apnea (adult) (pediatric): Secondary | ICD-10-CM

## 2013-11-20 DIAGNOSIS — E785 Hyperlipidemia, unspecified: Secondary | ICD-10-CM

## 2013-11-20 DIAGNOSIS — I4821 Permanent atrial fibrillation: Secondary | ICD-10-CM

## 2013-11-20 DIAGNOSIS — I10 Essential (primary) hypertension: Secondary | ICD-10-CM

## 2013-11-20 DIAGNOSIS — IMO0002 Reserved for concepts with insufficient information to code with codable children: Secondary | ICD-10-CM

## 2013-11-20 DIAGNOSIS — I4891 Unspecified atrial fibrillation: Secondary | ICD-10-CM | POA: Diagnosis not present

## 2013-11-20 DIAGNOSIS — E669 Obesity, unspecified: Secondary | ICD-10-CM

## 2013-11-20 DIAGNOSIS — Z9989 Dependence on other enabling machines and devices: Secondary | ICD-10-CM

## 2013-11-20 DIAGNOSIS — Z950 Presence of cardiac pacemaker: Secondary | ICD-10-CM

## 2013-11-20 NOTE — Patient Instructions (Signed)
Your physician recommends that you schedule a follow-up appointment in: 9 months. No changes were made today in your therapy. 

## 2013-11-29 ENCOUNTER — Encounter: Payer: Self-pay | Admitting: Cardiovascular Disease

## 2013-11-29 DIAGNOSIS — IMO0002 Reserved for concepts with insufficient information to code with codable children: Secondary | ICD-10-CM | POA: Insufficient documentation

## 2013-11-29 NOTE — Progress Notes (Signed)
Patient ID: Cameron Sharp, male   DOB: [birth year], 78 y.o.   MRN: [id number]     HPI: Cameron Sharp is a 78 y.o. male who presents to the office today for a  follow up cardiology evaluation.  I had last seen him one year ago for cardiology evaluation, but had seen him in January 2015 and a sleep clinic in followup of his obstructive sleep apnea.  Cameron Sharp has a history of permanent atrial fibrillation, for which he's been on chronic Coumadin therapy.  He also has a history of hypertension, hyperlipidemia, obstructive sleep apnea on CPAP therapy, and obesity.  In July 2010 he underwent insertion of a St. Jude Zephyr single-chamber pacemaker.  He had recently seen Dr. rote for her for pacemaker followup evaluation.  Cameron Sharp has had difficulty with weight fluctuation.  Rate ranging from 344 pounds to 271 pounds.  When I saw him in April 2013.  His weight had reduced to 271 pounds.  Since that time, his weight has gradually increased and he now weighs approximately 323 pounds.  He has had difficulty with lower back degenerative disc disease, which has limited some of his previous ambulation.  In a week, he will be leaving for [place] for the next 5 months.  He denies any chest pain.  He has been taking his metoprolol tartrate for weight control and denies any palpitations.  He also has been taking the valsartan HCT 160/12.5 for  Hypertension. he has tolerated atorvastatin 20 mg for hyperlipidemia.  He is on Coumadin and checks his protimes at home.  He presents for evaluation prior to his summer in [place].  Past Medical History  Diagnosis Date  . Permanent atrial fibrillation   . Hypertension   . OSA on CPAP   . Hyperlipidemia   . Pacemaker -[place] 2010 05/01/2013  . Atrial fibrillation 10/10/2012  . HTN (hypertension) 05/01/2013    Past Surgical History  Procedure Laterality Date  . Gallbladder surgery  12/2006  . Permanent pacemaker insertion  01/09/2009    [employer].Jude   . US echocardiography  11/24/07    mild LVH,LA & RA mod to severely dilated,mild mitral annular ca+,mild TR,mild Pulmonary hypertensio,AOV mod. sclerotic,mild AI w/root dilatation and ca+.  . Nm myocar perf wall motion  05/22/07    no significant ischemia    No Known Allergies  Current Outpatient Prescriptions  Medication Sig Dispense Refill  . atorvastatin (LIPITOR) 20 MG tablet Take 1 tablet (20 mg total) by mouth daily.  90 tablet  1  . COUMADIN 5 MG tablet Take one tablet daily or as directed.  90 tablet  2  . gabapentin (NEURONTIN) 300 MG capsule Take 900 mg by mouth daily.       Marland Kitchen HYDROcodone-acetaminophen (NORCO) 10-325 MG per tablet Take 1 tablet by mouth every 6 (six) hours as needed for pain.      Marland Kitchen L-Methylfolate-Algae-B12-B6 (METANX) 3-90.314-2-35 MG CAPS Take 1 capsule by mouth daily.      . metoprolol tartrate (LOPRESSOR) 25 MG tablet Take 0.5 tablets (12.5 mg total) by mouth 2 (two) times daily.  90 tablet  3  . Probiotic Product (PROBIOTIC DAILY) CAPS Take 1 capsule by mouth daily.      . Thiamine HCl (VITAMIN B-1) 250 MG tablet Take 250 mg by mouth daily.      . valsartan-hydrochlorothiazide (DIOVAN-HCT) 160-12.5 MG per tablet Take 1 tablet by mouth daily.  90 tablet  3   No current facility-administered medications for  this visit.    History   Social History  . Marital Status: Married    Spouse Name: N/A    Number of Children: N/A  . Years of Education: N/A   Occupational History  . Not on file.   Social History Main Topics  . Smoking status: Former Smoker    Quit date: 07/23/1971  . Smokeless tobacco: Not on file  . Alcohol Use: Yes  . Drug Use: No  . Sexual Activity: Not on file   Other Topics Concern  . Not on file   Social History Narrative  . No narrative on file    Family History  Problem Relation Age of Onset  . Cancer Father     ROS General: Positive for weight gain No fevers, chills, or night sweats HEENT: Negative; No changes in  vision or hearing, sinus congestion, difficulty swallowing Pulmonary: Negative; No cough, wheezing, shortness of breath, hemoptysis Cardiovascular: Negative; No chest pain, presyncope, syncope, palpatations Positive for mild edema GI: Negative; No nausea, vomiting, diarrhea, or abdominal pain GU: Negative; No dysuria, hematuria, or difficulty voiding Musculoskeletal: Oz of her low back discomfort; no myalgias, joint pain, or weakness Hematologic: Negative; no easy bruising, bleeding Endocrine: Negative; no heat/cold intolerance; no diabetes, Neuro: Oz of her degenerative disc disease; no changes in balance, headaches Skin: Negative; No rashes or skin lesions Psychiatric: Negative; No behavioral problems, depression Sleep: Negative; No snoring,  daytime sleepiness, hypersomnolence, bruxism, restless legs, hypnogognic hallucinations. Other comprehensive 14 point system review is negative     Physical Exam BP 138/68  Pulse 65  Ht 6\' 4"  (1.93 m)  Wt 323 lb 11.2 oz (146.829 kg)  BMI 39.42 kg/m2 General: Alert, oriented, no distress.  Skin: normal turgor, no rashes, warm and dry HEENT: Normocephalic, atraumatic. Pupils equal round and reactive to light; sclera anicteric; extraocular muscles intact, No lid lag; Nose without nasal septal hypertrophy; Mouth/Parynx benign; Mallinpatti scale 3 Neck: No JVD, no carotid bruits; normal carotid upstroke Lungs: clear to ausculatation and percussion bilaterally; no wheezing or rales, normal inspiratory and expiratory effort Chest wall: without tenderness to palpitation Heart: PMI not displaced, RRR, s1 s2 normal, 1/6 systolic murmur, No diastolic murmur, no rubs, gallops, thrills, or heaves Abdomen: Mild diastases recti soft, nontender; no hepatosplenomehaly, BS+; abdominal aorta nontender and not dilated by palpation. Back: no CVA tenderness Pulses: 2+  Musculoskeletal: full range of motion, normal strength, no joint deformities Extremities:  Positive for trace ankle edema with stasis changes to his lower extremities, no clubbing cyanosis, Homan's sign negative  Neurologic: grossly nonfocal; Cranial nerves grossly wnl Psychologic: Normal mood and affect    ECG (independently read by me): Underlying atrial fibrillation with appropriate V. pacing and V. sensing.  Ventricular rate 65  LABS:  BMET    Component Value Date/Time   NA 139 11/19/2013 0843   K 4.1 11/19/2013 0843   CL 103 11/19/2013 0843   CO2 28 11/19/2013 0843   GLUCOSE 95 11/19/2013 0843   BUN 18 11/19/2013 0843   CREATININE 0.96 11/19/2013 0843   CALCIUM 8.9 11/19/2013 0843     Hepatic Function Panel     Component Value Date/Time   PROT 6.6 11/19/2013 0843   ALBUMIN 4.2 11/19/2013 0843   AST 23 11/19/2013 0843   ALT 13 11/19/2013 0843   ALKPHOS 39 11/19/2013 0843   BILITOT 0.8 11/19/2013 0843     CBC    Component Value Date/Time   WBC 5.9 11/19/2013 0843   RBC 4.64  11/19/2013 0843   HGB 15.2 11/19/2013 0843   HCT 44.2 11/19/2013 0843   PLT 191 11/19/2013 0843   MCV 95.3 11/19/2013 0843   MCH 32.8 11/19/2013 0843   MCHC 34.4 11/19/2013 0843   RDW 14.7 11/19/2013 0843     BNP No results found for this basename: probnp    Lipid Panel     Component Value Date/Time   CHOL 155 11/19/2013 0843   TRIG 104 11/19/2013 0843   HDL 53 11/19/2013 0843   CHOLHDL 2.9 11/19/2013 0843   VLDL 21 11/19/2013 0843   LDLCALC 81 11/19/2013 0843     RADIOLOGY: Ct Lumbar Spine Wo Contrast  11/10/2013   CLINICAL DATA:  Low back pain.  Left leg weakness.  EXAM: CT LUMBAR SPINE WITHOUT CONTRAST  TECHNIQUE: Multidetector CT imaging of the lumbar spine was performed without intravenous contrast administration. Multiplanar CT image reconstructions were also generated.  COMPARISON:  Lumbar spine radiographs 09/23/2012.  FINDINGS: Mild degenerative scoliosis convex left mid lumbar region. Anatomic alignment except for 2 mm facet mediated slip L5-S1. Advanced disc space narrowing L3-4 and  L4-5. No worrisome osseous lesions. Atherosclerosis of the aorta, non aneurysmal. Unremarkable SI joints.  At L1-L2, there is a large extraforaminal spur to the left. There is some left-sided foraminal narrowing which could affect the L1 nerve root. Slight left lateral recess stenosis without clear-cut L2 nerve root impingement.  At L2-L3, there is mild disc space narrowing. There is moderate facet ligamentum flavum hypertrophy. Mild left and moderate right neural foraminal narrowing could affect the L2 nerve roots. Mild central canal stenosis without definite L3 nerve root impingement.  At L3-L4, there is multifactorial spinal stenosis related to right greater than left disc space narrowing, central protrusion which is partially calcified, osteophytic spurring, as well as posterior element hypertrophy. Bilateral L3 and L4 nerve root impingement are likely, right worse than left.  At L4-5, there is a large calcified right paracentral central disc extrusion associated with moderate to severe disc space narrowing. Bilateral foraminal narrowing is evident. Advanced posterior element hypertrophy. Right greater than left L5 nerve root impingement in the canal with bilateral L4 nerve root impingement in the foramina.  At L5-S1 there is 2 mm of facet mediated anterolisthesis. There is mild annular bulging. Advanced facet overgrowth. Bilateral L5 nerve root impingement is likely due to foraminal narrowing. No definite S1 nerve root compression in the canal.  IMPRESSION: Multilevel spondylosis as described. Potentially symptomatic left-sided neural compression is observed at L1-2, L3-4, L4-5, and L5-S1. See discussion above.  If further investigation is desired, consider CT myelography with flexion extension views.   Electronically Signed   By: Rolla Flatten M.D.   On: 11/10/2013 14:32      ASSESSMENT AND PLAN: Cameron. Jabreel Sharp is a 78 year old gentleman who has a long-standing history of permanent atrial  fibrillation, for which he is on Coumadin anticoagulation.  His pacemaker is a Nurse, children's pacemaker, and this is functioning appropriately.  His blood pressure today is well controlled on combination low-dose metoprolol and valsartan HCT.  He has obstructive sleep apnea and admits to 100% compliance with CPAP use.  He denies residual daytime sleepiness or breakthrough snoring.  He does have multilevel spondylosis as in well described in the above CT of his lumbar spine.  We discussed his significant weight gain from his last office visit and he has gained 52 pounds and the importance of weight loss  I also reviewed her recent blood  work that he had yesterday.  He is not anemic.  Renal and liver function are stable.  Potassium is 4.1.  Total cholesterol was excellent at 155 triglycerides 104, HDL 53, LDL 81.  He continue his current therapy.  He is tolerating atorvastatin, without myalgias.  I will see him in 6-9 months for followup evaluation.     Cameron Sine, MD, [facility]  11/29/2013 4:33 PM

## 2013-12-16 ENCOUNTER — Ambulatory Visit (INDEPENDENT_AMBULATORY_CARE_PROVIDER_SITE_OTHER): Payer: Medicare Other | Admitting: Pharmacist Clinician (PhC)/ Clinical Pharmacy Specialist

## 2013-12-16 DIAGNOSIS — Z7901 Long term (current) use of anticoagulants: Secondary | ICD-10-CM

## 2013-12-16 DIAGNOSIS — I4821 Permanent atrial fibrillation: Secondary | ICD-10-CM

## 2013-12-16 DIAGNOSIS — I4891 Unspecified atrial fibrillation: Secondary | ICD-10-CM

## 2013-12-16 LAB — POCT INR: INR: 2.5

## 2013-12-23 DIAGNOSIS — R52 Pain, unspecified: Secondary | ICD-10-CM | POA: Diagnosis not present

## 2013-12-23 DIAGNOSIS — M79609 Pain in unspecified limb: Secondary | ICD-10-CM | POA: Diagnosis not present

## 2014-01-13 ENCOUNTER — Ambulatory Visit (INDEPENDENT_AMBULATORY_CARE_PROVIDER_SITE_OTHER): Payer: Medicare Other | Admitting: Pharmacist Clinician (PhC)/ Clinical Pharmacy Specialist

## 2014-01-13 DIAGNOSIS — Z7901 Long term (current) use of anticoagulants: Secondary | ICD-10-CM

## 2014-01-13 DIAGNOSIS — I4821 Permanent atrial fibrillation: Secondary | ICD-10-CM

## 2014-01-13 DIAGNOSIS — I4891 Unspecified atrial fibrillation: Secondary | ICD-10-CM | POA: Diagnosis not present

## 2014-01-13 LAB — POCT INR: INR: 1.8

## 2014-02-08 ENCOUNTER — Ambulatory Visit (INDEPENDENT_AMBULATORY_CARE_PROVIDER_SITE_OTHER): Payer: Medicare Other | Admitting: Pharmacist Clinician (PhC)/ Clinical Pharmacy Specialist

## 2014-02-08 DIAGNOSIS — I4891 Unspecified atrial fibrillation: Secondary | ICD-10-CM

## 2014-02-08 DIAGNOSIS — Z7901 Long term (current) use of anticoagulants: Secondary | ICD-10-CM

## 2014-02-08 DIAGNOSIS — I4821 Permanent atrial fibrillation: Secondary | ICD-10-CM

## 2014-02-08 LAB — POCT INR: INR: 3.8

## 2014-03-03 ENCOUNTER — Ambulatory Visit (INDEPENDENT_AMBULATORY_CARE_PROVIDER_SITE_OTHER): Payer: Medicare Other | Admitting: Pharmacist Clinician (PhC)/ Clinical Pharmacy Specialist

## 2014-03-03 DIAGNOSIS — I4891 Unspecified atrial fibrillation: Secondary | ICD-10-CM

## 2014-03-03 DIAGNOSIS — I4821 Permanent atrial fibrillation: Secondary | ICD-10-CM

## 2014-03-03 DIAGNOSIS — Z7901 Long term (current) use of anticoagulants: Secondary | ICD-10-CM

## 2014-03-03 LAB — POCT INR: INR: 4.2

## 2014-03-11 LAB — POCT INR: INR: 2

## 2014-03-12 ENCOUNTER — Ambulatory Visit (INDEPENDENT_AMBULATORY_CARE_PROVIDER_SITE_OTHER): Payer: Medicare Other | Admitting: Pharmacist Clinician (PhC)/ Clinical Pharmacy Specialist

## 2014-03-12 DIAGNOSIS — Z7901 Long term (current) use of anticoagulants: Secondary | ICD-10-CM

## 2014-03-12 DIAGNOSIS — I4821 Permanent atrial fibrillation: Secondary | ICD-10-CM

## 2014-03-12 DIAGNOSIS — I4891 Unspecified atrial fibrillation: Secondary | ICD-10-CM

## 2014-03-17 ENCOUNTER — Telehealth: Payer: Self-pay | Admitting: *Deleted

## 2014-03-17 NOTE — Telephone Encounter (Signed)
Faxed last lab info to [facility].

## 2014-03-23 ENCOUNTER — Other Ambulatory Visit: Payer: Self-pay | Admitting: Cardiovascular Disease

## 2014-04-05 ENCOUNTER — Ambulatory Visit (INDEPENDENT_AMBULATORY_CARE_PROVIDER_SITE_OTHER): Payer: Medicare Other | Admitting: Pharmacist Clinician (PhC)/ Clinical Pharmacy Specialist

## 2014-04-05 DIAGNOSIS — Z7901 Long term (current) use of anticoagulants: Secondary | ICD-10-CM | POA: Diagnosis not present

## 2014-04-05 DIAGNOSIS — I4891 Unspecified atrial fibrillation: Secondary | ICD-10-CM

## 2014-04-05 DIAGNOSIS — I4821 Permanent atrial fibrillation: Secondary | ICD-10-CM

## 2014-04-05 LAB — POCT INR: INR: 3.1

## 2014-04-20 ENCOUNTER — Ambulatory Visit (INDEPENDENT_AMBULATORY_CARE_PROVIDER_SITE_OTHER): Payer: Medicare Other | Admitting: Pharmacist Clinician (PhC)/ Clinical Pharmacy Specialist

## 2014-04-20 DIAGNOSIS — Z7901 Long term (current) use of anticoagulants: Secondary | ICD-10-CM

## 2014-04-20 DIAGNOSIS — I4891 Unspecified atrial fibrillation: Secondary | ICD-10-CM

## 2014-04-20 DIAGNOSIS — I4821 Permanent atrial fibrillation: Secondary | ICD-10-CM

## 2014-04-20 LAB — POCT INR: INR: 2.4

## 2014-04-27 DIAGNOSIS — D3131 Benign neoplasm of right choroid: Secondary | ICD-10-CM | POA: Diagnosis not present

## 2014-04-27 DIAGNOSIS — H524 Presbyopia: Secondary | ICD-10-CM | POA: Diagnosis not present

## 2014-04-27 DIAGNOSIS — H5203 Hypermetropia, bilateral: Secondary | ICD-10-CM | POA: Diagnosis not present

## 2014-04-27 DIAGNOSIS — H2513 Age-related nuclear cataract, bilateral: Secondary | ICD-10-CM | POA: Diagnosis not present

## 2014-04-27 DIAGNOSIS — H52223 Regular astigmatism, bilateral: Secondary | ICD-10-CM | POA: Diagnosis not present

## 2014-04-28 DIAGNOSIS — Z79899 Other long term (current) drug therapy: Secondary | ICD-10-CM | POA: Diagnosis not present

## 2014-04-28 DIAGNOSIS — M5408 Panniculitis affecting regions of neck and back, sacral and sacrococcygeal region: Secondary | ICD-10-CM | POA: Diagnosis not present

## 2014-04-28 DIAGNOSIS — G894 Chronic pain syndrome: Secondary | ICD-10-CM | POA: Diagnosis not present

## 2014-04-28 DIAGNOSIS — Z79891 Long term (current) use of opiate analgesic: Secondary | ICD-10-CM | POA: Diagnosis not present

## 2014-04-28 DIAGNOSIS — M5137 Other intervertebral disc degeneration, lumbosacral region: Secondary | ICD-10-CM | POA: Diagnosis not present

## 2014-05-04 ENCOUNTER — Telehealth: Payer: Self-pay | Admitting: Cardiovascular Disease

## 2014-05-04 NOTE — Telephone Encounter (Signed)
Pt need clarence to stop his Coumadin for his Colonoscopy.Please fax [phone] Att: Amy

## 2014-05-04 NOTE — Telephone Encounter (Signed)
Will forward for dr Claiborne Billings review

## 2014-05-05 ENCOUNTER — Encounter: Payer: Self-pay | Admitting: *Deleted

## 2014-05-05 NOTE — Telephone Encounter (Signed)
Ok to hold for 3 days prior

## 2014-05-05 NOTE — Telephone Encounter (Signed)
Clearance letter sent Dr. Earlie Raveling.

## 2014-05-18 ENCOUNTER — Ambulatory Visit (INDEPENDENT_AMBULATORY_CARE_PROVIDER_SITE_OTHER): Payer: Medicare Other | Admitting: Cardiovascular Disease

## 2014-05-18 ENCOUNTER — Encounter: Payer: Self-pay | Admitting: Cardiovascular Disease

## 2014-05-18 ENCOUNTER — Ambulatory Visit (INDEPENDENT_AMBULATORY_CARE_PROVIDER_SITE_OTHER): Payer: Medicare Other | Admitting: Pharmacist Clinician (PhC)/ Clinical Pharmacy Specialist

## 2014-05-18 VITALS — BP 114/72 | HR 65 | Resp 16 | Ht 76.0 in | Wt 324.6 lb

## 2014-05-18 DIAGNOSIS — G4733 Obstructive sleep apnea (adult) (pediatric): Secondary | ICD-10-CM

## 2014-05-18 DIAGNOSIS — I10 Essential (primary) hypertension: Secondary | ICD-10-CM

## 2014-05-18 DIAGNOSIS — I482 Chronic atrial fibrillation: Secondary | ICD-10-CM

## 2014-05-18 DIAGNOSIS — I4821 Permanent atrial fibrillation: Secondary | ICD-10-CM

## 2014-05-18 DIAGNOSIS — E785 Hyperlipidemia, unspecified: Secondary | ICD-10-CM | POA: Diagnosis not present

## 2014-05-18 DIAGNOSIS — Z950 Presence of cardiac pacemaker: Secondary | ICD-10-CM | POA: Diagnosis not present

## 2014-05-18 DIAGNOSIS — Z7901 Long term (current) use of anticoagulants: Secondary | ICD-10-CM | POA: Diagnosis not present

## 2014-05-18 DIAGNOSIS — Z9989 Dependence on other enabling machines and devices: Secondary | ICD-10-CM

## 2014-05-18 DIAGNOSIS — E669 Obesity, unspecified: Secondary | ICD-10-CM | POA: Diagnosis not present

## 2014-05-18 DIAGNOSIS — E66812 Obesity, class 2: Secondary | ICD-10-CM

## 2014-05-18 LAB — MDC_IDC_ENUM_SESS_TYPE_INCLINIC
Battery Impedance: 1000 Ohm — CL
Battery Voltage: 2.78 V
Implantable Pulse Generator Model: 5626
Lead Channel Pacing Threshold Amplitude: 0.5 V
Lead Channel Setting Pacing Amplitude: 2.5 V
Lead Channel Setting Pacing Pulse Width: 0.4 ms
Lead Channel Setting Sensing Sensitivity: 2 mV
MDC IDC MSMT LEADCHNL RV IMPEDANCE VALUE: 794 Ohm
MDC IDC MSMT LEADCHNL RV PACING THRESHOLD PULSEWIDTH: 0.4 ms
MDC IDC MSMT LEADCHNL RV SENSING INTR AMPL: 12 mV
MDC IDC PG SERIAL: 7043529
MDC IDC SESS DTM: 20151027144251

## 2014-05-18 LAB — POCT INR: INR: 1.9

## 2014-05-18 NOTE — Patient Instructions (Signed)
Dr. Sallyanne Kuster recommends that you schedule a follow-up appointment in: 6 Months with device check.

## 2014-05-20 ENCOUNTER — Encounter: Payer: Self-pay | Admitting: Cardiovascular Disease

## 2014-05-20 NOTE — Progress Notes (Signed)
Patient ID: Cameron Sharp, male   DOB: [birth year], 78 y.o.   MRN: [id number]     Reason for office visit Pacemaker check, permanent atrial fibrillation   Cameron Sharp is here for pacemaker followup and follow for atrial fibrillation, hypertension and hyperlipidemia. He has obstructive sleep apnea on CPAP therapy for which he has seen Dr. Ellouise Newer.   He has permanent atrial fibrillation with slow ventricular response and a single chamber St. Jude non-RF pacemaker, implanted in 2010. He has not had stroke/TIA or any bleeding complications while on chronic warfarin therapy. Most recent echocardiography in 2009 showed an ejection fraction of 45-50%. He has never had clinical congestive heart failure. Normal nuclear perfusion study in 2008. He does not have known coronary disease.   He has no complaints today.   Pacemaker check shows an episode of unexplained unipolar switch that occurred in July, when he was (as usually in the summer) in [place].Marland Kitchen He was unaware of any problem and does not recall any unusual electromagnetic field exposure around that time. The device is reprogrammed to bipolar mode and the lead parameters are all excellent and similar to previous records. He is not device dependent.  V paced 60%. Good rate control. No high ventricular rates.    No Known Allergies  Current Outpatient Prescriptions  Medication Sig Dispense Refill  . atorvastatin (LIPITOR) 20 MG tablet Take 1 tablet (20 mg total) by mouth daily.  90 tablet  1  . COUMADIN 5 MG tablet TAKE 1 TABLET DAILY OR AS DIRECTED  90 tablet  1  . gabapentin (NEURONTIN) 300 MG capsule Take 900 mg by mouth daily.       Marland Kitchen HYDROcodone-acetaminophen (NORCO) 10-325 MG per tablet Take 1 tablet by mouth every 6 (six) hours as needed for pain.      Marland Kitchen L-Methylfolate-Algae-B12-B6 (METANX) 3-90.314-2-35 MG CAPS Take 1 capsule by mouth daily.      . metoprolol tartrate (LOPRESSOR) 25 MG tablet Take 0.5 tablets (12.5 mg total) by mouth 2  (two) times daily.  90 tablet  3  . Probiotic Product (PROBIOTIC DAILY) CAPS Take 1 capsule by mouth daily.      . Thiamine HCl (VITAMIN B-1) 250 MG tablet Take 250 mg by mouth daily.      . valsartan-hydrochlorothiazide (DIOVAN-HCT) 160-12.5 MG per tablet Take 1 tablet by mouth daily.  90 tablet  3   No current facility-administered medications for this visit.    Past Medical History  Diagnosis Date  . Permanent atrial fibrillation   . Hypertension   . OSA on CPAP   . Hyperlipidemia   . Pacemaker -[place] 2010 05/01/2013  . Atrial fibrillation 10/10/2012  . HTN (hypertension) 05/01/2013    Past Surgical History  Procedure Laterality Date  . Gallbladder surgery  12/2006  . Permanent pacemaker insertion  01/09/2009    St.Jude  . US echocardiography  11/24/07    mild LVH,LA & RA mod to severely dilated,mild mitral annular ca+,mild TR,mild Pulmonary hypertensio,AOV mod. sclerotic,mild AI w/root dilatation and ca+.  . Nm myocar perf wall motion  05/22/07    no significant ischemia    Family History  Problem Relation Age of Onset  . Cancer Father     History   Social History  . Marital Status: Married    Spouse Name: N/A    Number of Children: N/A  . Years of Education: N/A   Occupational History  . Not on file.   Social History Main  Topics  . Smoking status: Former Smoker    Quit date: 07/23/1971  . Smokeless tobacco: Not on file  . Alcohol Use: Yes  . Drug Use: No  . Sexual Activity: Not on file   Other Topics Concern  . Not on file   Social History Narrative  . No narrative on file    Review of systems: The patient specifically denies any chest pain at rest or with exertion, dyspnea at rest or with exertion, orthopnea, paroxysmal nocturnal dyspnea, syncope, palpitations, focal neurological deficits, intermittent claudication, lower extremity edema, unexplained weight gain, cough, hemoptysis or wheezing.  The patient also denies abdominal  pain, nausea, vomiting, dysphagia, diarrhea, constipation, polyuria, polydipsia, dysuria, hematuria, frequency, urgency, abnormal bleeding or bruising, fever, chills, unexpected weight changes, mood swings, change in skin or hair texture, change in voice quality, auditory or visual problems, allergic reactions or rashes, new musculoskeletal complaints other than usual "aches and pains".      PHYSICAL EXAM BP 114/72  Pulse 65  Resp 16  Ht 6\' 4"  (1.93 m)  Wt 324 lb 9.6 oz (147.238 kg)  BMI 39.53 kg/m2 General: Alert, oriented x3, no distress, severe obesity limits evaluation  Head: no evidence of trauma, PERRL, EOMI, no exophtalmos or lid lag, no myxedema, no xanthelasma; normal ears, nose and oropharynx  Neck: normal jugular venous pulsations and no hepatojugular reflux; brisk carotid pulses without delay and no carotid bruits  Chest: clear to auscultation, no signs of consolidation by percussion or palpation, normal fremitus, symmetrical and full respiratory excursions  Cardiovascular: normal position and quality of the apical impulse, regular rhythm, normal first and paradoxically second heart sounds, no murmurs, rubs or gallops  Abdomen: no tenderness or distention, no masses by palpation, no abnormal pulsatility or arterial bruits, normal bowel sounds, no hepatosplenomegaly  Extremities: no clubbing, cyanosis or edema; 2+ radial, ulnar and brachial pulses bilaterally; 2+ right femoral, posterior tibial and dorsalis pedis pulses; 2+ left femoral, posterior tibial and dorsalis pedis pulses; no subclavian or femoral bruits  Neurological: grossly nonfocal   EKG: Atrial Fibrillation, ventricular pacing   Lipid Panel     Component Value Date/Time   CHOL 155 11/19/2013 0843   TRIG 104 11/19/2013 0843   HDL 53 11/19/2013 0843   CHOLHDL 2.9 11/19/2013 0843   VLDL 21 11/19/2013 0843   LDLCALC 81 11/19/2013 0843    BMET    Component Value Date/Time   NA 139 11/19/2013 0843   K 4.1 11/19/2013  0843   CL 103 11/19/2013 0843   CO2 28 11/19/2013 0843   GLUCOSE 95 11/19/2013 0843   BUN 18 11/19/2013 0843   CREATININE 0.96 11/19/2013 0843   CALCIUM 8.9 11/19/2013 0843     ASSESSMENT AND PLAN  Atrial fibrillation  On appropriate warfarin anti-coagulation. Anticoagulation levels have been therapeutic. No bleeding complications. No history of stroke or TIA. Roughly 60% ventricular pacing due to slow ventricular response.   Pacemaker -[place] 2010  Patient presented again to the office with a polarity switch to unipolar sense/pulse. A similar phenomenon happened in May 2014, and this event always seems to happen when he is in [place]. I suspect there is some external magnetic field that causes the unipolar switch, although polarity switch has been described as an early sign of lead connector corrosion. Testing was performed in both the unipolar and bipolar fashion and there were no problems with the bipolar function. Patient's device was reprogrammed back to its original bipolar configuration. Patient will  follow up in 6 months.   OSA on CPAP  Reports virtually 100% compliance with CPAP   HTN (hypertension)  Excellent control.   Obesity (BMI 30.0-34.9)  Weight loss recommended.   Hyperlipidemia  Good lipid profile  Patient Instructions  Dr. Sallyanne Kuster recommends that you schedule a follow-up appointment in: 6 Months with device check.      Orders Placed This Encounter  Procedures  . Implantable device check  . EKG 12-Lead   No orders of the defined types were placed in this encounter.    Holli Humbles, MD, [place] [phone] office [phone] pager

## 2014-05-24 DIAGNOSIS — Z7901 Long term (current) use of anticoagulants: Secondary | ICD-10-CM | POA: Diagnosis not present

## 2014-05-24 DIAGNOSIS — Z5181 Encounter for therapeutic drug level monitoring: Secondary | ICD-10-CM | POA: Diagnosis not present

## 2014-05-25 DIAGNOSIS — D126 Benign neoplasm of colon, unspecified: Secondary | ICD-10-CM | POA: Diagnosis not present

## 2014-05-25 DIAGNOSIS — Z86010 Personal history of colonic polyps: Secondary | ICD-10-CM | POA: Diagnosis not present

## 2014-05-25 DIAGNOSIS — D120 Benign neoplasm of cecum: Secondary | ICD-10-CM | POA: Diagnosis not present

## 2014-05-25 DIAGNOSIS — D124 Benign neoplasm of descending colon: Secondary | ICD-10-CM | POA: Diagnosis not present

## 2014-05-25 DIAGNOSIS — D123 Benign neoplasm of transverse colon: Secondary | ICD-10-CM | POA: Diagnosis not present

## 2014-05-25 DIAGNOSIS — D122 Benign neoplasm of ascending colon: Secondary | ICD-10-CM | POA: Diagnosis not present

## 2014-05-25 DIAGNOSIS — R197 Diarrhea, unspecified: Secondary | ICD-10-CM | POA: Diagnosis not present

## 2014-05-25 DIAGNOSIS — K648 Other hemorrhoids: Secondary | ICD-10-CM | POA: Diagnosis not present

## 2014-05-25 DIAGNOSIS — K5730 Diverticulosis of large intestine without perforation or abscess without bleeding: Secondary | ICD-10-CM | POA: Diagnosis not present

## 2014-05-25 DIAGNOSIS — K641 Second degree hemorrhoids: Secondary | ICD-10-CM | POA: Diagnosis not present

## 2014-05-28 ENCOUNTER — Encounter: Payer: Self-pay | Admitting: Cardiovascular Disease

## 2014-06-03 DIAGNOSIS — R109 Unspecified abdominal pain: Secondary | ICD-10-CM | POA: Diagnosis not present

## 2014-06-03 DIAGNOSIS — R10819 Abdominal tenderness, unspecified site: Secondary | ICD-10-CM | POA: Diagnosis not present

## 2014-06-03 DIAGNOSIS — R509 Fever, unspecified: Secondary | ICD-10-CM | POA: Diagnosis not present

## 2014-06-03 DIAGNOSIS — Z8719 Personal history of other diseases of the digestive system: Secondary | ICD-10-CM | POA: Diagnosis not present

## 2014-06-03 DIAGNOSIS — R5383 Other fatigue: Secondary | ICD-10-CM | POA: Diagnosis not present

## 2014-06-04 ENCOUNTER — Telehealth: Payer: Self-pay | Admitting: Cardiovascular Disease

## 2014-06-04 ENCOUNTER — Ambulatory Visit (INDEPENDENT_AMBULATORY_CARE_PROVIDER_SITE_OTHER): Payer: Medicare Other | Admitting: Pharmacist Clinician (PhC)/ Clinical Pharmacy Specialist

## 2014-06-04 DIAGNOSIS — I4821 Permanent atrial fibrillation: Secondary | ICD-10-CM

## 2014-06-04 DIAGNOSIS — Z7901 Long term (current) use of anticoagulants: Secondary | ICD-10-CM

## 2014-06-04 DIAGNOSIS — I482 Chronic atrial fibrillation: Secondary | ICD-10-CM

## 2014-06-04 LAB — POCT INR: INR: 1

## 2014-06-04 NOTE — Telephone Encounter (Signed)
See anticoag note

## 2014-06-04 NOTE — Telephone Encounter (Signed)
Received call from [facility] to report INR today 1.1.Message sent to our pharmacist [place].

## 2014-06-07 ENCOUNTER — Ambulatory Visit (INDEPENDENT_AMBULATORY_CARE_PROVIDER_SITE_OTHER): Payer: Medicare Other | Admitting: Pharmacist Clinician (PhC)/ Clinical Pharmacy Specialist

## 2014-06-07 DIAGNOSIS — I4891 Unspecified atrial fibrillation: Secondary | ICD-10-CM | POA: Diagnosis not present

## 2014-06-07 DIAGNOSIS — Z7901 Long term (current) use of anticoagulants: Secondary | ICD-10-CM

## 2014-06-07 DIAGNOSIS — I4821 Permanent atrial fibrillation: Secondary | ICD-10-CM

## 2014-06-07 DIAGNOSIS — I482 Chronic atrial fibrillation: Secondary | ICD-10-CM

## 2014-06-07 DIAGNOSIS — R509 Fever, unspecified: Secondary | ICD-10-CM | POA: Diagnosis not present

## 2014-06-07 LAB — POCT INR: INR: 2.3

## 2014-06-11 ENCOUNTER — Ambulatory Visit (INDEPENDENT_AMBULATORY_CARE_PROVIDER_SITE_OTHER): Payer: Medicare Other | Admitting: Pharmacist Clinician (PhC)/ Clinical Pharmacy Specialist

## 2014-06-11 DIAGNOSIS — I482 Chronic atrial fibrillation: Secondary | ICD-10-CM

## 2014-06-11 DIAGNOSIS — I4821 Permanent atrial fibrillation: Secondary | ICD-10-CM

## 2014-06-11 DIAGNOSIS — Z7901 Long term (current) use of anticoagulants: Secondary | ICD-10-CM

## 2014-06-11 LAB — POCT INR: INR: 2.9

## 2014-06-21 ENCOUNTER — Ambulatory Visit (INDEPENDENT_AMBULATORY_CARE_PROVIDER_SITE_OTHER): Payer: Medicare Other | Admitting: Pharmacist Clinician (PhC)/ Clinical Pharmacy Specialist

## 2014-06-21 DIAGNOSIS — Z7901 Long term (current) use of anticoagulants: Secondary | ICD-10-CM

## 2014-06-21 DIAGNOSIS — I482 Chronic atrial fibrillation: Secondary | ICD-10-CM

## 2014-06-21 DIAGNOSIS — I4821 Permanent atrial fibrillation: Secondary | ICD-10-CM

## 2014-06-21 LAB — POCT INR: INR: 2.5

## 2014-06-22 DIAGNOSIS — Z79891 Long term (current) use of opiate analgesic: Secondary | ICD-10-CM | POA: Diagnosis not present

## 2014-06-22 DIAGNOSIS — M5386 Other specified dorsopathies, lumbar region: Secondary | ICD-10-CM | POA: Diagnosis not present

## 2014-06-22 DIAGNOSIS — G894 Chronic pain syndrome: Secondary | ICD-10-CM | POA: Diagnosis not present

## 2014-06-22 DIAGNOSIS — M5408 Panniculitis affecting regions of neck and back, sacral and sacrococcygeal region: Secondary | ICD-10-CM | POA: Diagnosis not present

## 2014-07-07 DIAGNOSIS — L918 Other hypertrophic disorders of the skin: Secondary | ICD-10-CM | POA: Diagnosis not present

## 2014-07-07 DIAGNOSIS — L720 Epidermal cyst: Secondary | ICD-10-CM | POA: Diagnosis not present

## 2014-07-07 DIAGNOSIS — L821 Other seborrheic keratosis: Secondary | ICD-10-CM | POA: Diagnosis not present

## 2014-07-07 DIAGNOSIS — D2262 Melanocytic nevi of left upper limb, including shoulder: Secondary | ICD-10-CM | POA: Diagnosis not present

## 2014-07-07 DIAGNOSIS — L570 Actinic keratosis: Secondary | ICD-10-CM | POA: Diagnosis not present

## 2014-07-07 DIAGNOSIS — D225 Melanocytic nevi of trunk: Secondary | ICD-10-CM | POA: Diagnosis not present

## 2014-07-08 DIAGNOSIS — I4891 Unspecified atrial fibrillation: Secondary | ICD-10-CM | POA: Diagnosis not present

## 2014-07-08 DIAGNOSIS — Z0000 Encounter for general adult medical examination without abnormal findings: Secondary | ICD-10-CM | POA: Diagnosis not present

## 2014-07-08 DIAGNOSIS — Z125 Encounter for screening for malignant neoplasm of prostate: Secondary | ICD-10-CM | POA: Diagnosis not present

## 2014-07-13 DIAGNOSIS — Z8719 Personal history of other diseases of the digestive system: Secondary | ICD-10-CM | POA: Diagnosis not present

## 2014-07-13 DIAGNOSIS — M549 Dorsalgia, unspecified: Secondary | ICD-10-CM | POA: Diagnosis not present

## 2014-07-13 DIAGNOSIS — I4891 Unspecified atrial fibrillation: Secondary | ICD-10-CM | POA: Diagnosis not present

## 2014-07-13 DIAGNOSIS — Z23 Encounter for immunization: Secondary | ICD-10-CM | POA: Diagnosis not present

## 2014-07-13 DIAGNOSIS — Z0000 Encounter for general adult medical examination without abnormal findings: Secondary | ICD-10-CM | POA: Diagnosis not present

## 2014-07-20 DIAGNOSIS — G894 Chronic pain syndrome: Secondary | ICD-10-CM | POA: Diagnosis not present

## 2014-07-20 DIAGNOSIS — I4891 Unspecified atrial fibrillation: Secondary | ICD-10-CM | POA: Diagnosis not present

## 2014-07-20 DIAGNOSIS — M5137 Other intervertebral disc degeneration, lumbosacral region: Secondary | ICD-10-CM | POA: Diagnosis not present

## 2014-07-20 DIAGNOSIS — Z7901 Long term (current) use of anticoagulants: Secondary | ICD-10-CM | POA: Diagnosis not present

## 2014-07-20 DIAGNOSIS — Z79891 Long term (current) use of opiate analgesic: Secondary | ICD-10-CM | POA: Diagnosis not present

## 2014-07-20 DIAGNOSIS — Z79899 Other long term (current) drug therapy: Secondary | ICD-10-CM | POA: Diagnosis not present

## 2014-07-20 LAB — POCT INR: INR: 2.8

## 2014-07-21 ENCOUNTER — Other Ambulatory Visit: Payer: Self-pay | Admitting: Cardiovascular Disease

## 2014-07-21 ENCOUNTER — Ambulatory Visit (INDEPENDENT_AMBULATORY_CARE_PROVIDER_SITE_OTHER): Payer: Medicare Other | Admitting: Pharmacist Clinician (PhC)/ Clinical Pharmacy Specialist

## 2014-07-21 DIAGNOSIS — I4821 Permanent atrial fibrillation: Secondary | ICD-10-CM

## 2014-07-21 DIAGNOSIS — Z7901 Long term (current) use of anticoagulants: Secondary | ICD-10-CM

## 2014-07-21 DIAGNOSIS — I482 Chronic atrial fibrillation: Secondary | ICD-10-CM

## 2014-07-21 NOTE — Telephone Encounter (Signed)
Rx(s) sent to pharmacy electronically.  

## 2014-07-27 ENCOUNTER — Telehealth: Payer: Self-pay | Admitting: *Deleted

## 2014-07-27 DIAGNOSIS — E7800 Pure hypercholesterolemia, unspecified: Secondary | ICD-10-CM

## 2014-07-27 DIAGNOSIS — Z79899 Other long term (current) drug therapy: Secondary | ICD-10-CM

## 2014-07-27 NOTE — Telephone Encounter (Signed)
Lab order mailed to patient with note to have done FASTING in April 2016.

## 2014-07-28 ENCOUNTER — Telehealth: Payer: Self-pay | Admitting: Cardiovascular Disease

## 2014-07-29 NOTE — Telephone Encounter (Signed)
Closed encounter °

## 2014-08-10 DIAGNOSIS — M549 Dorsalgia, unspecified: Secondary | ICD-10-CM | POA: Diagnosis not present

## 2014-08-10 DIAGNOSIS — G894 Chronic pain syndrome: Secondary | ICD-10-CM | POA: Diagnosis not present

## 2014-08-10 DIAGNOSIS — M488X6 Other specified spondylopathies, lumbar region: Secondary | ICD-10-CM | POA: Diagnosis not present

## 2014-08-10 DIAGNOSIS — M47817 Spondylosis without myelopathy or radiculopathy, lumbosacral region: Secondary | ICD-10-CM | POA: Diagnosis not present

## 2014-08-18 ENCOUNTER — Ambulatory Visit (INDEPENDENT_AMBULATORY_CARE_PROVIDER_SITE_OTHER): Payer: Medicare Other | Admitting: Cardiovascular Disease

## 2014-08-18 ENCOUNTER — Ambulatory Visit (INDEPENDENT_AMBULATORY_CARE_PROVIDER_SITE_OTHER): Payer: Medicare Other | Admitting: Pharmacist Clinician (PhC)/ Clinical Pharmacy Specialist

## 2014-08-18 VITALS — BP 124/82 | HR 67 | Ht 76.0 in | Wt 335.3 lb

## 2014-08-18 DIAGNOSIS — E669 Obesity, unspecified: Secondary | ICD-10-CM | POA: Diagnosis not present

## 2014-08-18 DIAGNOSIS — I4891 Unspecified atrial fibrillation: Secondary | ICD-10-CM | POA: Diagnosis not present

## 2014-08-18 DIAGNOSIS — G4733 Obstructive sleep apnea (adult) (pediatric): Secondary | ICD-10-CM | POA: Diagnosis not present

## 2014-08-18 DIAGNOSIS — I10 Essential (primary) hypertension: Secondary | ICD-10-CM

## 2014-08-18 DIAGNOSIS — I482 Chronic atrial fibrillation: Secondary | ICD-10-CM | POA: Diagnosis not present

## 2014-08-18 DIAGNOSIS — Z950 Presence of cardiac pacemaker: Secondary | ICD-10-CM

## 2014-08-18 DIAGNOSIS — E785 Hyperlipidemia, unspecified: Secondary | ICD-10-CM

## 2014-08-18 DIAGNOSIS — I4821 Permanent atrial fibrillation: Secondary | ICD-10-CM

## 2014-08-18 DIAGNOSIS — Z7901 Long term (current) use of anticoagulants: Secondary | ICD-10-CM | POA: Diagnosis not present

## 2014-08-18 DIAGNOSIS — Z9989 Dependence on other enabling machines and devices: Secondary | ICD-10-CM

## 2014-08-18 LAB — POCT INR: INR: 2.8

## 2014-08-18 NOTE — Patient Instructions (Signed)
Your physician has requested that you have an echocardiogram. Echocardiography is a painless test that uses sound waves to create images of your heart. It provides your doctor with information about the size and shape of your heart and how well your heart's chambers and valves are working. This procedure takes approximately one hour. There are no restrictions for this procedure. >> please schedule this for March or April  Your physician recommends that you schedule a follow-up appointment in April.

## 2014-08-19 ENCOUNTER — Encounter: Payer: Self-pay | Admitting: Cardiovascular Disease

## 2014-08-19 NOTE — Progress Notes (Signed)
Patient ID: Cameron Sharp, male   DOB: 03-27-1936, 79 y.o.   MRN: [id number]    HPI: Cameron Sharp is a 79 y.o. male who presents to the office today for an 8 momth  follow up cardiology evaluation.  Cameron Sharp has a history of permanent atrial fibrillation, for which he's been on chronic Coumadin therapy.  He does home testing of his Coumadin.  He also has a history of hypertension, hyperlipidemia, obstructive sleep apnea on CPAP therapy, and obesity.  In July 2010 he underwent insertion of a [employer] Zephyr single-chamber pacemaker.  He sees Dr. Sallyanne Kuster for pacemaker followup evaluation.  Cameron Sharp has had difficulty with weight fluctuation with weights ranging from 344 pounds to 271 pounds.  When I saw him in April 2013 his weight had reduced to 271 pounds.  Since that time, his weight has gradually increased and he now weighs approximately 335 pounds.  He has had difficulty with lower back degenerative disc disease, which has limited some of his previous ambulation.  He spent the summer in [place].  He has difficulty with degenerative disc disease of his lumbar spine which has prevented his ambulation.  He also may have some celiac disease and previously had developed diarrhea, but this had improved with a gluten-free diet.  He has obstructive sleep apnea.  He admits to 100% compliance with CPAP therapy.  His last echo Doppler study was in 2009.  He tells me he will be going back to [place] May 1 and will be there through October.  Resents for evaluation.   Past Medical History  Diagnosis Date  . Permanent atrial fibrillation   . Hypertension   . OSA on CPAP   . Hyperlipidemia   . Pacemaker -Emery 2010 05/01/2013  . Atrial fibrillation 10/10/2012  . HTN (hypertension) 05/01/2013    Past Surgical History  Procedure Laterality Date  . Gallbladder surgery  12/2006  . Permanent pacemaker insertion  01/09/2009    [employer]ude  . US echocardiography  11/24/07    mild  LVH,LA & RA mod to severely dilated,mild mitral annular ca+,mild TR,mild Pulmonary hypertensio,AOV mod. sclerotic,mild AI w/root dilatation and ca+.  . Nm myocar perf wall motion  05/22/07    no significant ischemia    No Known Allergies  Current Outpatient Prescriptions  Medication Sig Dispense Refill  . atorvastatin (LIPITOR) 20 MG tablet Take 1 tablet (20 mg total) by mouth daily. 90 tablet 1  . COUMADIN 5 MG tablet TAKE 1 TABLET DAILY OR AS DIRECTED 90 tablet 1  . gabapentin (NEURONTIN) 300 MG capsule Take 900 mg by mouth daily.     Marland Kitchen HYDROcodone-acetaminophen (NORCO) 10-325 MG per tablet Take 1 tablet by mouth every 6 (six) hours as needed for pain.    Marland Kitchen L-Methylfolate-Algae-B12-B6 (METANX) 3-90.314-2-35 MG CAPS Take 1 capsule by mouth daily.    . metoprolol tartrate (LOPRESSOR) 25 MG tablet TAKE ONE-HALF TABLET (12.5 MG) TWICE A DAY 90 tablet 2  . Thiamine HCl (VITAMIN B-1) 250 MG tablet Take 250 mg by mouth daily.    . valsartan-hydrochlorothiazide (DIOVAN-HCT) 160-12.5 MG per tablet TAKE 1 TABLET DAILY 90 tablet 2   No current facility-administered medications for this visit.    History   Social History  . Marital Status: Married    Spouse Name: N/A    Number of Children: N/A  . Years of Education: N/A   Occupational History  . Not on file.   Social History Main Topics  .  Smoking status: Former Smoker    Quit date: 07/23/1971  . Smokeless tobacco: Not on file  . Alcohol Use: Yes  . Drug Use: No  . Sexual Activity: Not on file   Other Topics Concern  . Not on file   Social History Narrative    Family History  Problem Relation Age of Onset  . Cancer Father     ROS General: Positive for weight gain No fevers, chills, or night sweats;  HEENT: Negative; No changes in vision or hearing, sinus congestion, difficulty swallowing Pulmonary: Negative; No cough, wheezing, shortness of breath, hemoptysis Cardiovascular: Negative; No chest pain, presyncope, syncope,  palpatations Positive for mild edema GI: Negative; No nausea, vomiting, diarrhea, or abdominal pain GU: Negative; No dysuria, hematuria, or difficulty voiding Musculoskeletal: Positive for low back discomfort due to degenerative disc disease;  no myalgias, joint pain, or weakness Hematologic: Negative; no easy bruising, bleeding Endocrine: Negative; no heat/cold intolerance; no diabetes, Neuro: Positive for degenerative disc disease; no changes in balance, headaches Skin: Negative; No rashes or skin lesions Psychiatric: Negative; No behavioral problems, depression Sleep: Positive for obstructive sleep apnea with CPAP therapy and 100% compliance.  No snoring,  daytime sleepiness, hypersomnolence, bruxism, restless legs, hypnogognic hallucinations. Other comprehensive 14 point system review is negative     Physical Exam BP 124/82 mmHg  Pulse 67  Ht 6\' 4"  (1.93 m)  Wt 335 lb 4.8 oz (152.091 kg)  BMI 40.83 kg/m2   Wt Readings from Last 3 Encounters:  08/18/14 335 lb 4.8 oz (152.091 kg)  05/18/14 324 lb 9.6 oz (147.238 kg)  11/20/13 323 lb 11.2 oz (146.829 kg)   General: Alert, oriented, no distress.  Skin: normal turgor, no rashes, warm and dry HEENT: Normocephalic, atraumatic. Pupils equal round and reactive to light; sclera anicteric; extraocular muscles intact, No lid lag; Nose without nasal septal hypertrophy; Mouth/Parynx benign; Mallinpatti scale 3 Neck: No JVD, no carotid bruits; normal carotid upstroke Lungs: clear to ausculatation and percussion bilaterally; no wheezing or rales, normal inspiratory and expiratory effort Chest wall: without tenderness to palpitation Heart: PMI not displaced, RRR, s1 s2 normal, 1/6 systolic murmur, No diastolic murmur, no rubs, gallops, thrills, or heaves Abdomen: Mild diastases recti soft, nontender; no hepatosplenomehaly, BS+; abdominal aorta nontender and not dilated by palpation. Back: no CVA tenderness Pulses: 2+  Musculoskeletal: full  range of motion, normal strength, no joint deformities Extremities: Positive for trace ankle edema with stasis changes to his lower extremities, no clubbing cyanosis, Homan's sign negative  Neurologic: grossly nonfocal; Cranial nerves grossly wnl Psychologic: Normal mood and affect  ECG (independently read by me): Atrial fibrillation at 67 bpm with nonspecific T changes.  Appropriate V pacing and sensing.  ECG (independently read by me): Underlying atrial fibrillation with appropriate V. pacing and V. sensing.  Ventricular rate 65  LABS:  BMET    Component Value Date/Time   NA 139 11/19/2013 0843   K 4.1 11/19/2013 0843   CL 103 11/19/2013 0843   CO2 28 11/19/2013 0843   GLUCOSE 95 11/19/2013 0843   BUN 18 11/19/2013 0843   CREATININE 0.96 11/19/2013 0843   CALCIUM 8.9 11/19/2013 0843     Hepatic Function Panel     Component Value Date/Time   PROT 6.6 11/19/2013 0843   ALBUMIN 4.2 11/19/2013 0843   AST 23 11/19/2013 0843   ALT 13 11/19/2013 0843   ALKPHOS 39 11/19/2013 0843   BILITOT 0.8 11/19/2013 0843     CBC  Component Value Date/Time   WBC 5.9 11/19/2013 0843   RBC 4.64 11/19/2013 0843   HGB 15.2 11/19/2013 0843   HCT 44.2 11/19/2013 0843   PLT 191 11/19/2013 0843   MCV 95.3 11/19/2013 0843   MCH 32.8 11/19/2013 0843   MCHC 34.4 11/19/2013 0843   RDW 14.7 11/19/2013 0843     BNP No results found for: PROBNP  Lipid Panel     Component Value Date/Time   CHOL 155 11/19/2013 0843   TRIG 104 11/19/2013 0843   HDL 53 11/19/2013 0843   CHOLHDL 2.9 11/19/2013 0843   VLDL 21 11/19/2013 0843   LDLCALC 81 11/19/2013 0843     RADIOLOGY: Ct Lumbar Spine Wo Contrast  11/10/2013   CLINICAL DATA:  Low back pain.  Left leg weakness.  EXAM: CT LUMBAR SPINE WITHOUT CONTRAST  TECHNIQUE: Multidetector CT imaging of the lumbar spine was performed without intravenous contrast administration. Multiplanar CT image reconstructions were also generated.  COMPARISON:   Lumbar spine radiographs 09/23/2012.  FINDINGS: Mild degenerative scoliosis convex left mid lumbar region. Anatomic alignment except for 2 mm facet mediated slip L5-S1. Advanced disc space narrowing L3-4 and L4-5. No worrisome osseous lesions. Atherosclerosis of the aorta, non aneurysmal. Unremarkable SI joints.  At L1-L2, there is a large extraforaminal spur to the left. There is some left-sided foraminal narrowing which could affect the L1 nerve root. Slight left lateral recess stenosis without clear-cut L2 nerve root impingement.  At L2-L3, there is mild disc space narrowing. There is moderate facet ligamentum flavum hypertrophy. Mild left and moderate right neural foraminal narrowing could affect the L2 nerve roots. Mild central canal stenosis without definite L3 nerve root impingement.  At L3-L4, there is multifactorial spinal stenosis related to right greater than left disc space narrowing, central protrusion which is partially calcified, osteophytic spurring, as well as posterior element hypertrophy. Bilateral L3 and L4 nerve root impingement are likely, right worse than left.  At L4-5, there is a large calcified right paracentral central disc extrusion associated with moderate to severe disc space narrowing. Bilateral foraminal narrowing is evident. Advanced posterior element hypertrophy. Right greater than left L5 nerve root impingement in the canal with bilateral L4 nerve root impingement in the foramina.  At L5-S1 there is 2 mm of facet mediated anterolisthesis. There is mild annular bulging. Advanced facet overgrowth. Bilateral L5 nerve root impingement is likely due to foraminal narrowing. No definite S1 nerve root compression in the canal.  IMPRESSION: Multilevel spondylosis as described. Potentially symptomatic left-sided neural compression is observed at L1-2, L3-4, L4-5, and L5-S1. See discussion above.  If further investigation is desired, consider CT myelography with flexion extension views.    Electronically Signed   By: Rolla Flatten M.D.   On: 11/10/2013 14:32      ASSESSMENT AND PLAN: Cameron Sharp is a 79 year old gentleman who has a long-standing history of permanent atrial fibrillation, for which he is on Coumadin anticoagulation.  His most recent INR is 2.8 on his current therapy of 5 mg with the exception of 2.5 mg on Monday, Wednesday and Friday.  He is therapeutic.  He denies bleeding.  His pacemaker is a Nurse, children's pacemaker, and this is functioning appropriately.  He was last evaluated by Dr. Sallyanne Kuster and when he was last interrogated.  He had an episode of a polarity switched to unipolar sense/pulse.  The patient's device was reprogrammed back to a bipolar configuration in October 2015.  His blood pressure today  is well controlled on combination low-dose metoprolol and valsartan HCT.  He has obstructive sleep apnea and admits to 100% compliance with CPAP use.  He denies residual daytime sleepiness or breakthrough snoring.  He does have multilevel spondylosis as noted on CT of his lumbar spine which has limited his exercise capacity and contributed to his progressive weight gain.  However, he admits that he does eat too much and has large portions..  I reviewed recent blood work which was done by Dr. Maudie Mercury in December 2015.  His hemoglobin and hematocrit were stable.  Renal function revealed a BUN of 22, creatinine 1.4.  Normal liver function studies.  Since his last echo Doppler study has been in 2009 and showed an ejection fraction of 45-50% at that time with his permanent atrial fibrillation, I have recommended a follow-up echo Doppler study to reassess his systolic and diastolic function.  This will be done in early April.  I will see him in follow-up prior to his return to [place] in early May.  Time spent: 25 minutes   Troy Sine, MD, [facility]  08/19/2014 3:27 PM

## 2014-08-26 ENCOUNTER — Encounter: Payer: Self-pay | Admitting: Cardiovascular Disease

## 2014-09-16 ENCOUNTER — Ambulatory Visit (INDEPENDENT_AMBULATORY_CARE_PROVIDER_SITE_OTHER): Payer: Medicare Other | Admitting: Pharmacist Clinician (PhC)/ Clinical Pharmacy Specialist

## 2014-09-16 DIAGNOSIS — I482 Chronic atrial fibrillation: Secondary | ICD-10-CM

## 2014-09-16 DIAGNOSIS — I4821 Permanent atrial fibrillation: Secondary | ICD-10-CM

## 2014-09-16 DIAGNOSIS — Z7901 Long term (current) use of anticoagulants: Secondary | ICD-10-CM

## 2014-09-16 DIAGNOSIS — I4891 Unspecified atrial fibrillation: Secondary | ICD-10-CM | POA: Diagnosis not present

## 2014-09-16 LAB — PROTIME-INR: INR: 3.5 — AB (ref 0.9–1.1)

## 2014-09-16 LAB — POCT INR: INR: 3.5

## 2014-10-05 ENCOUNTER — Other Ambulatory Visit: Payer: Self-pay | Admitting: Cardiovascular Disease

## 2014-10-05 ENCOUNTER — Ambulatory Visit (INDEPENDENT_AMBULATORY_CARE_PROVIDER_SITE_OTHER): Payer: Medicare Other | Admitting: Pharmacist Clinician (PhC)/ Clinical Pharmacy Specialist

## 2014-10-05 DIAGNOSIS — M549 Dorsalgia, unspecified: Secondary | ICD-10-CM | POA: Diagnosis not present

## 2014-10-05 DIAGNOSIS — Z79899 Other long term (current) drug therapy: Secondary | ICD-10-CM | POA: Diagnosis not present

## 2014-10-05 DIAGNOSIS — M47817 Spondylosis without myelopathy or radiculopathy, lumbosacral region: Secondary | ICD-10-CM | POA: Diagnosis not present

## 2014-10-05 DIAGNOSIS — M5137 Other intervertebral disc degeneration, lumbosacral region: Secondary | ICD-10-CM | POA: Diagnosis not present

## 2014-10-05 DIAGNOSIS — I482 Chronic atrial fibrillation: Secondary | ICD-10-CM

## 2014-10-05 DIAGNOSIS — Z7901 Long term (current) use of anticoagulants: Secondary | ICD-10-CM

## 2014-10-05 DIAGNOSIS — I4821 Permanent atrial fibrillation: Secondary | ICD-10-CM

## 2014-10-05 DIAGNOSIS — G894 Chronic pain syndrome: Secondary | ICD-10-CM | POA: Diagnosis not present

## 2014-10-05 LAB — PROTIME-INR: INR: 2.9 — AB (ref 0.9–1.1)

## 2014-10-05 LAB — POCT INR: INR: 2.9

## 2014-10-05 NOTE — Telephone Encounter (Signed)
Rx(s) sent to pharmacy electronically.  

## 2014-10-08 ENCOUNTER — Telehealth: Payer: Self-pay | Admitting: Cardiovascular Disease

## 2014-10-11 ENCOUNTER — Ambulatory Visit (HOSPITAL_COMMUNITY)
Admission: RE | Admit: 2014-10-11 | Discharge: 2014-10-11 | Disposition: A | Discharge status: Home / Self Care | Payer: Medicare Other | Source: Ambulatory Visit | Attending: Cardiovascular Disease | Admitting: Cardiovascular Disease

## 2014-10-11 DIAGNOSIS — I10 Essential (primary) hypertension: Secondary | ICD-10-CM | POA: Insufficient documentation

## 2014-10-11 DIAGNOSIS — I4891 Unspecified atrial fibrillation: Secondary | ICD-10-CM | POA: Diagnosis not present

## 2014-10-11 NOTE — Telephone Encounter (Signed)
Closed encounter °

## 2014-10-11 NOTE — Progress Notes (Signed)
2D Echocardiogram Complete.  10/11/2014   Tilia Faso [place], RDCS

## 2014-10-18 DIAGNOSIS — M488X6 Other specified spondylopathies, lumbar region: Secondary | ICD-10-CM | POA: Diagnosis not present

## 2014-10-18 DIAGNOSIS — M549 Dorsalgia, unspecified: Secondary | ICD-10-CM | POA: Diagnosis not present

## 2014-10-18 DIAGNOSIS — M47817 Spondylosis without myelopathy or radiculopathy, lumbosacral region: Secondary | ICD-10-CM | POA: Diagnosis not present

## 2014-10-18 DIAGNOSIS — M5137 Other intervertebral disc degeneration, lumbosacral region: Secondary | ICD-10-CM | POA: Diagnosis not present

## 2014-10-25 DIAGNOSIS — E780 Pure hypercholesterolemia: Secondary | ICD-10-CM | POA: Diagnosis not present

## 2014-10-25 DIAGNOSIS — Z79899 Other long term (current) drug therapy: Secondary | ICD-10-CM | POA: Diagnosis not present

## 2014-10-25 LAB — COMPREHENSIVE METABOLIC PANEL
ALK PHOS: 41 U/L (ref 39–117)
ALT: 14 U/L (ref 0–53)
AST: 20 U/L (ref 0–37)
Albumin: 4.7 g/dL (ref 3.5–5.2)
BILIRUBIN TOTAL: 1.4 mg/dL — AB (ref 0.2–1.2)
BUN: 26 mg/dL — ABNORMAL HIGH (ref 6–23)
CHLORIDE: 104 meq/L (ref 96–112)
CO2: 26 mEq/L (ref 19–32)
CREATININE: 1.12 mg/dL (ref 0.50–1.35)
Calcium: 9.5 mg/dL (ref 8.4–10.5)
Glucose, Bld: 93 mg/dL (ref 70–99)
Potassium: 4.2 mEq/L (ref 3.5–5.3)
Sodium: 141 mEq/L (ref 135–145)
Total Protein: 7.2 g/dL (ref 6.0–8.3)

## 2014-10-25 LAB — LIPID PANEL
CHOLESTEROL: 173 mg/dL (ref 0–200)
HDL: 65 mg/dL (ref >=40)
LDL Cholesterol: 82 mg/dL (ref 0–99)
Total CHOL/HDL Ratio: 2.7 Ratio
Triglycerides: 129 mg/dL (ref <150)
VLDL: 26 mg/dL (ref 0–40)

## 2014-10-26 ENCOUNTER — Telehealth: Payer: Self-pay | Admitting: *Deleted

## 2014-10-26 ENCOUNTER — Other Ambulatory Visit: Payer: Self-pay | Admitting: *Deleted

## 2014-10-26 DIAGNOSIS — R899 Unspecified abnormal finding in specimens from other organs, systems and tissues: Secondary | ICD-10-CM

## 2014-10-26 NOTE — Telephone Encounter (Signed)
-----   Message from Sanda Klein, MD sent at 10/25/2014  6:58 PM EDT ----- Labs look good except very mild increase in kidney tests. May have been a little dehydrated when they were drawn. If he doesn't mind I like to repeat them in about 6 weeks' time to make sure that was the case. Glucose and lipids look great

## 2014-10-26 NOTE — Telephone Encounter (Signed)
Lab results called to patient. Voiced understanding. Will get repeat bmp prior to going on vacation (he will be gone until sometime in October).

## 2014-11-04 ENCOUNTER — Ambulatory Visit (INDEPENDENT_AMBULATORY_CARE_PROVIDER_SITE_OTHER): Payer: Medicare Other | Admitting: Pharmacist Clinician (PhC)/ Clinical Pharmacy Specialist

## 2014-11-04 DIAGNOSIS — I4821 Permanent atrial fibrillation: Secondary | ICD-10-CM

## 2014-11-04 DIAGNOSIS — I482 Chronic atrial fibrillation: Secondary | ICD-10-CM

## 2014-11-04 DIAGNOSIS — I4891 Unspecified atrial fibrillation: Secondary | ICD-10-CM | POA: Diagnosis not present

## 2014-11-04 DIAGNOSIS — Z7901 Long term (current) use of anticoagulants: Secondary | ICD-10-CM

## 2014-11-04 LAB — POCT INR: INR: 1.8

## 2014-11-09 DIAGNOSIS — M549 Dorsalgia, unspecified: Secondary | ICD-10-CM | POA: Diagnosis not present

## 2014-11-09 DIAGNOSIS — Z79899 Other long term (current) drug therapy: Secondary | ICD-10-CM | POA: Diagnosis not present

## 2014-11-09 DIAGNOSIS — M47817 Spondylosis without myelopathy or radiculopathy, lumbosacral region: Secondary | ICD-10-CM | POA: Diagnosis not present

## 2014-11-09 DIAGNOSIS — G894 Chronic pain syndrome: Secondary | ICD-10-CM | POA: Diagnosis not present

## 2014-11-09 DIAGNOSIS — M5137 Other intervertebral disc degeneration, lumbosacral region: Secondary | ICD-10-CM | POA: Diagnosis not present

## 2014-11-09 DIAGNOSIS — M79606 Pain in leg, unspecified: Secondary | ICD-10-CM | POA: Diagnosis not present

## 2014-11-11 ENCOUNTER — Ambulatory Visit (INDEPENDENT_AMBULATORY_CARE_PROVIDER_SITE_OTHER): Payer: Medicare Other | Admitting: Cardiovascular Disease

## 2014-11-11 ENCOUNTER — Encounter: Payer: Self-pay | Admitting: Cardiovascular Disease

## 2014-11-11 VITALS — BP 124/64 | HR 61 | Ht 76.0 in | Wt 334.0 lb

## 2014-11-11 DIAGNOSIS — Z950 Presence of cardiac pacemaker: Secondary | ICD-10-CM

## 2014-11-11 DIAGNOSIS — G4733 Obstructive sleep apnea (adult) (pediatric): Secondary | ICD-10-CM

## 2014-11-11 DIAGNOSIS — I482 Chronic atrial fibrillation: Secondary | ICD-10-CM | POA: Diagnosis not present

## 2014-11-11 DIAGNOSIS — I10 Essential (primary) hypertension: Secondary | ICD-10-CM

## 2014-11-11 DIAGNOSIS — E785 Hyperlipidemia, unspecified: Secondary | ICD-10-CM

## 2014-11-11 DIAGNOSIS — Z9989 Dependence on other enabling machines and devices: Secondary | ICD-10-CM

## 2014-11-11 DIAGNOSIS — I4821 Permanent atrial fibrillation: Secondary | ICD-10-CM

## 2014-11-11 DIAGNOSIS — Z7901 Long term (current) use of anticoagulants: Secondary | ICD-10-CM

## 2014-11-11 NOTE — Patient Instructions (Signed)
Your physician wants you to follow-up in: 6 months or sooner if needed. You will receive a reminder letter in the mail two months in advance. If you don't receive a letter, please call our office to schedule the follow-up appointment. 

## 2014-11-13 ENCOUNTER — Encounter: Payer: Self-pay | Admitting: Cardiovascular Disease

## 2014-11-13 NOTE — Progress Notes (Signed)
Patient ID: Cameron Sharp, male   DOB: [birth year], 79 y.o.   MRN: [id number]    HPI: Cameron Sharp is a 79 y.o. male who presents to the office today for an 8 momth  follow up cardiology evaluation.  Cameron Sharp has a history of permanent atrial fibrillation and is on chronic Coumadin therapy.  He does home testing of his Coumadin.  He also has a history of hypertension, hyperlipidemia, obstructive sleep apnea on CPAP therapy, and obesity.  In July 2010 he underwent insertion of a St. Jude Zephyr single-chamber pacemaker.  He sees Dr. Sallyanne Kuster for pacemaker followup evaluation.  Cameron Sharp has had difficulty with weight fluctuation with weights ranging from 344 pounds to 271 pounds.  When I saw him in April 2013 his weight had reduced to 271 pounds.  Since that time, his weight has gradually increased and he now weighs approximately 335 pounds.  He has had difficulty with lower back degenerative disc disease, which has limited some of his previous ambulation.  He spent the summer in [place].  He has difficulty with degenerative disc disease of his lumbar spine which has prevented his ambulation.  He also may have some celiac disease and previously had developed diarrhea, but this had improved with a gluten-free diet.  He has obstructive sleep apnea.  His CPAP unit is set at 14 cmwater pressure.  He admits to 100% compliance with CPAP therapy.  His sleep duration is typically greater than 8 hours per night.  He never sleeps without it.  He denies breakthrough snoring.  His sleep is restorative.  He denies excessive daytime sleepiness.  There is no bruxism.  There is no restless legs.    Since I last saw him, he underwent a seven-year follow-up echo Doppler study which was done on 10/11/2014.  This showed an ejection fraction at 55-60%.  There was moderate aortic sclerosis without stenosis with mild aortic insufficiency.  There was mild mitral regurgitation.  There was biatrial enlargement, left greater  than right.  There was trivial pulmonic insufficiency.  Pulmonary pressures were normal.  He tells me he will be going back to [place] May 1 and will be there through October.  He presents for evaluation.   Past Medical History  Diagnosis Date  . Permanent atrial fibrillation   . Hypertension   . OSA on CPAP   . Hyperlipidemia   . Pacemaker -[place] 2010 05/01/2013  . Atrial fibrillation 10/10/2012  . HTN (hypertension) 05/01/2013    Past Surgical History  Procedure Laterality Date  . Gallbladder surgery  12/2006  . Permanent pacemaker insertion  01/09/2009    St.Jude  . US echocardiography  11/24/07    mild LVH,LA & RA mod to severely dilated,mild mitral annular ca+,mild TR,mild Pulmonary hypertensio,AOV mod. sclerotic,mild AI w/root dilatation and ca+.  . Nm myocar perf wall motion  05/22/07    no significant ischemia    No Known Allergies  Current Outpatient Prescriptions  Medication Sig Dispense Refill  . atorvastatin (LIPITOR) 20 MG tablet Take 1 tablet (20 mg total) by mouth daily. 90 tablet 3  . baclofen (LIORESAL) 10 MG tablet Take 1 tablet by mouth 2 (two) times daily.  0  . COUMADIN 5 MG tablet TAKE 1 TABLET DAILY OR AS DIRECTED 90 tablet 1  . gabapentin (NEURONTIN) 300 MG capsule Take 900 mg by mouth daily.     Marland Kitchen HYDROcodone-acetaminophen (NORCO) 10-325 MG per tablet Take 1 tablet by mouth every 6 (six) hours  as needed for pain.    Marland Kitchen L-Methylfolate-Algae-B12-B6 (METANX) 3-90.314-2-35 MG CAPS Take 1 capsule by mouth daily.    . metoprolol tartrate (LOPRESSOR) 25 MG tablet TAKE ONE-HALF TABLET (12.5 MG) TWICE A DAY 90 tablet 2  . Thiamine HCl (VITAMIN B-1) 250 MG tablet Take 250 mg by mouth daily.    . valsartan-hydrochlorothiazide (DIOVAN-HCT) 160-12.5 MG per tablet TAKE 1 TABLET DAILY 90 tablet 2   No current facility-administered medications for this visit.    History   Social History  . Marital Status: Married    Spouse Name: N/A  . Number  of Children: N/A  . Years of Education: N/A   Occupational History  . Not on file.   Social History Main Topics  . Smoking status: Former Smoker    Quit date: 07/23/1971  . Smokeless tobacco: Not on file  . Alcohol Use: 0.0 oz/week    0 Standard drinks or equivalent per week  . Drug Use: No  . Sexual Activity: Not on file   Other Topics Concern  . Not on file   Social History Narrative    Family History  Problem Relation Age of Onset  . Cancer Father     ROS General: Positive for weight gain No fevers, chills, or night sweats;  HEENT: Negative; No changes in vision or hearing, sinus congestion, difficulty swallowing Pulmonary: Negative; No cough, wheezing, shortness of breath, hemoptysis Cardiovascular: Negative; No chest pain, presyncope, syncope, palpatations Positive for mild edema GI: Negative; No nausea, vomiting, diarrhea, or abdominal pain GU: Negative; No dysuria, hematuria, or difficulty voiding Musculoskeletal: Positive for low back discomfort due to degenerative disc disease;  no myalgias, joint pain, or weakness Hematologic: Negative; no easy bruising, bleeding Endocrine: Negative; no heat/cold intolerance; no diabetes, Neuro: Positive for degenerative disc disease; no changes in balance, headaches Skin: Negative; No rashes or skin lesions Psychiatric: Negative; No behavioral problems, depression Sleep: Positive for obstructive sleep apnea with CPAP therapy and 100% compliance.  No snoring,  daytime sleepiness, hypersomnolence, bruxism, restless legs, hypnogognic hallucinations. Other comprehensive 14 point system review is negative     Physical Exam BP 124/64 mmHg  Pulse 61  Ht '6\' 4"'  (1.93 m)  Wt 334 lb (151.501 kg)  BMI 40.67 kg/m2   Wt Readings from Last 3 Encounters:  11/11/14 334 lb (151.501 kg)  08/18/14 335 lb 4.8 oz (152.091 kg)  05/18/14 324 lb 9.6 oz (147.238 kg)   General: Alert, oriented, no distress.  Skin: normal turgor, no rashes,  warm and dry HEENT: Normocephalic, atraumatic. Pupils equal round and reactive to light; sclera anicteric; extraocular muscles intact, No lid lag; Nose without nasal septal hypertrophy; Mouth/Parynx benign; Mallinpatti scale 3 Neck: No JVD, no carotid bruits; normal carotid upstroke Lungs: clear to ausculatation and percussion bilaterally; no wheezing or rales, normal inspiratory and expiratory effort Chest wall: without tenderness to palpitation Heart: PMI not displaced, RRR, s1 s2 normal, 1/6 systolic murmur, No diastolic murmur, no rubs, gallops, thrills, or heaves Abdomen: Mild diastases recti soft, nontender; no hepatosplenomehaly, BS+; abdominal aorta nontender and not dilated by palpation. Back: no CVA tenderness Pulses: 2+  Musculoskeletal: full range of motion, normal strength, no joint deformities Extremities: Positive for trace ankle edema with stasis changes to his lower extremities, no clubbing cyanosis, Homan's sign negative  Neurologic: grossly nonfocal; Cranial nerves grossly wnl Psychologic: Normal mood and affect  ECG (independently read by me): Atrial fibrillation with appropriate V pacing and sensing.  January 2016 ECG (independently read by  me): Atrial fibrillation at 67 bpm with nonspecific T changes.  Appropriate V pacing and sensing.  Prior ECG (independently read by me): Underlying atrial fibrillation with appropriate V. pacing and V. sensing.  Ventricular rate 65  LABS:  BMP Latest Ref Rng 10/25/2014 11/19/2013 05/29/2013  Glucose 70 - 99 mg/dL 93 95 109(H)  BUN 6 - 23 mg/dL 26(H) 18 20  Creatinine 0.50 - 1.35 mg/dL 1.12 0.96 1.12  Sodium 135 - 145 mEq/L 141 139 142  Potassium 3.5 - 5.3 mEq/L 4.2 4.1 4.9  Chloride 96 - 112 mEq/L 104 103 105  CO2 19 - 32 mEq/L '26 28 31  ' Calcium 8.4 - 10.5 mg/dL 9.5 8.9 9.9     Hepatic Function Latest Ref Rng 10/25/2014 11/19/2013 05/29/2013  Total Protein 6.0 - 8.3 g/dL 7.2 6.6 7.0  Albumin 3.5 - 5.2 g/dL 4.7 4.2 4.3  AST 0 - 37  U/L '20 23 25  ' ALT 0 - 53 U/L '14 13 14  ' Alk Phosphatase 39 - 117 U/L 41 39 47  Total Bilirubin 0.2 - 1.2 mg/dL 1.4(H) 0.8 1.0    CBC Latest Ref Rng 11/19/2013  WBC 4.0 - 10.5 K/uL 5.9  Hemoglobin 13.0 - 17.0 g/dL 15.2  Hematocrit 39.0 - 52.0 % 44.2  Platelets 150 - 400 K/uL 191   No results found for: TSH  BNP No results found for: PROBNP  Lipid Panel     Component Value Date/Time   CHOL 173 10/25/2014 0838   TRIG 129 10/25/2014 0838   HDL 65 10/25/2014 0838   CHOLHDL 2.7 10/25/2014 0838   VLDL 26 10/25/2014 0838   LDLCALC 82 10/25/2014 0838     RADIOLOGY: Ct Lumbar Spine Wo Contrast  11/10/2013   CLINICAL DATA:  Low back pain.  Left leg weakness.  EXAM: CT LUMBAR SPINE WITHOUT CONTRAST  TECHNIQUE: Multidetector CT imaging of the lumbar spine was performed without intravenous contrast administration. Multiplanar CT image reconstructions were also generated.  COMPARISON:  Lumbar spine radiographs 09/23/2012.  FINDINGS: Mild degenerative scoliosis convex left mid lumbar region. Anatomic alignment except for 2 mm facet mediated slip L5-S1. Advanced disc space narrowing L3-4 and L4-5. No worrisome osseous lesions. Atherosclerosis of the aorta, non aneurysmal. Unremarkable SI joints.  At L1-L2, there is a large extraforaminal spur to the left. There is some left-sided foraminal narrowing which could affect the L1 nerve root. Slight left lateral recess stenosis without clear-cut L2 nerve root impingement.  At L2-L3, there is mild disc space narrowing. There is moderate facet ligamentum flavum hypertrophy. Mild left and moderate right neural foraminal narrowing could affect the L2 nerve roots. Mild central canal stenosis without definite L3 nerve root impingement.  At L3-L4, there is multifactorial spinal stenosis related to right greater than left disc space narrowing, central protrusion which is partially calcified, osteophytic spurring, as well as posterior element hypertrophy. Bilateral L3  and L4 nerve root impingement are likely, right worse than left.  At L4-5, there is a large calcified right paracentral central disc extrusion associated with moderate to severe disc space narrowing. Bilateral foraminal narrowing is evident. Advanced posterior element hypertrophy. Right greater than left L5 nerve root impingement in the canal with bilateral L4 nerve root impingement in the foramina.  At L5-S1 there is 2 mm of facet mediated anterolisthesis. There is mild annular bulging. Advanced facet overgrowth. Bilateral L5 nerve root impingement is likely due to foraminal narrowing. No definite S1 nerve root compression in the canal.  IMPRESSION: Multilevel spondylosis as  described. Potentially symptomatic left-sided neural compression is observed at L1-2, L3-4, L4-5, and L5-S1. See discussion above.  If further investigation is desired, consider CT myelography with flexion extension views.   Electronically Signed   By: Rolla Flatten M.D.   On: 11/10/2013 14:32      ASSESSMENT AND PLAN: Cameron Sharp is a 79 year old gentleman who has a long-standing history of permanent atrial fibrillation, for which he is on Coumadin anticoagulation.  His most recent INR is 1.8 and his Coumadin dose was readjusted.  He denies bleeding.  His pacemaker is a Nurse, children's pacemaker, and this is functioning appropriately.  His blood pressure today is well controlled on combination low-dose metoprolol and valsartan HCT.  I reviewed his laboratory.  He continues to tolerate atorvastatin 20 mg.  There are no myalgias.  He has obstructive sleep apnea and he continues to have 100% compliance with CPAP use with average sleep time greater than 8 hours per night.  He denies residual daytime sleepiness or breakthrough snoring.  He does have multilevel spondylosis as noted on CT of his lumbar spine which has limited his exercise capacity and contributed to his progressive weight gain.  However, he admits that he  does eat too much and has large portions.  We discussed the importance of weight loss.  He now is meeting criteria for morbid obesity.  I reviewed his echo Doppler data with him in detail.  Ejection fraction remains normal.  He does have aortic sclerosis with mild AR, mild Cameron, and biatrial enlargement.  He hopefully will be exercising more this summer up in [place].  I will see him in October when he comes back for follow-up evaluation. Time spent: 25 minutes   Troy Sine, MD, [facility]  11/13/2014 12:48 PM

## 2014-11-15 ENCOUNTER — Encounter: Payer: Self-pay | Admitting: Cardiovascular Disease

## 2014-11-15 ENCOUNTER — Ambulatory Visit (INDEPENDENT_AMBULATORY_CARE_PROVIDER_SITE_OTHER): Payer: Medicare Other | Admitting: Cardiovascular Disease

## 2014-11-15 ENCOUNTER — Ambulatory Visit: Payer: Medicare Other | Admitting: Cardiovascular Disease

## 2014-11-15 VITALS — BP 120/70 | HR 59 | Ht 76.0 in | Wt 334.0 lb

## 2014-11-15 DIAGNOSIS — I482 Chronic atrial fibrillation: Secondary | ICD-10-CM | POA: Diagnosis not present

## 2014-11-15 DIAGNOSIS — Z950 Presence of cardiac pacemaker: Secondary | ICD-10-CM

## 2014-11-15 DIAGNOSIS — I10 Essential (primary) hypertension: Secondary | ICD-10-CM

## 2014-11-15 DIAGNOSIS — I4821 Permanent atrial fibrillation: Secondary | ICD-10-CM

## 2014-11-15 DIAGNOSIS — E785 Hyperlipidemia, unspecified: Secondary | ICD-10-CM

## 2014-11-15 DIAGNOSIS — G4733 Obstructive sleep apnea (adult) (pediatric): Secondary | ICD-10-CM | POA: Diagnosis not present

## 2014-11-15 DIAGNOSIS — Z9989 Dependence on other enabling machines and devices: Secondary | ICD-10-CM

## 2014-11-15 NOTE — Progress Notes (Signed)
Patient ID: Cameron Sharp, male   DOB: June 13, 1936, 79 y.o.   MRN: [id number]     Cardiology Office Note   Date:  11/15/2014   ID:  Cameron Sharp, DOB 25-Mar-1936, MRN [id number]  PCP:  [name], MD  Cardiologist:   Sanda Klein, MD   Chief Complaint  Patient presents with  . Follow-up    6 MONTH in office pacemaker check.  Recent visit with Dr. Claiborne Billings.      History of Present Illness: Cameron Sharp is a 79 y.o. male who presents for permanent atrial fibrillation, systemic hypertension, hyperlipidemia and a pacemaker check.  He has no cardiovascular complaints. He is planning to leave for [place] in less than a month, where he will spend roughly 6 months. He just had an office visit with Dr. Claiborne Billings last week.  He has permanent atrial fibrillation with slow ventricular response and a single chamber St. Jude non-RF pacemaker, implanted in 2010. He has not had stroke/TIA or any bleeding complications while on chronic warfarin therapy. Previous echocardiography in 2009 showed an ejection fraction of 45-50%, with a follow-up study performed a month ago showed normal left ventricular ejection fraction and moderate left atrial enlargement. Right ventricle was mildly dilated there were no serious valvular abnormalities. He has never had clinical congestive heart failure. Normal nuclear perfusion study in 2008. He does not have known coronary disease.   Pacemaker interrogation shows normal device function. He has a Sport and exercise psychologist. Jude Zephrex LA SR model [id number]. There is 71% ventricular pacing. No episodes of high ventricular rate have been detected. He has an expected generator longevity of 8-10 years.  In both 2014 and 2015 he had episodes of polarity switch of uncertain cause leading to unipolar pacing with high voltage. Both occasions this happened shortly after he moved to [place].   Past Medical History  Diagnosis Date  . Permanent atrial fibrillation   . Hypertension   . OSA on CPAP   .  Hyperlipidemia   . Pacemaker -[place] 2010 05/01/2013  . Atrial fibrillation 10/10/2012  . HTN (hypertension) 05/01/2013    Past Surgical History  Procedure Laterality Date  . Gallbladder surgery  12/2006  . Permanent pacemaker insertion  01/09/2009    St.Jude  . US echocardiography  11/24/07    mild LVH,LA & RA mod to severely dilated,mild mitral annular ca+,mild TR,mild Pulmonary hypertensio,AOV mod. sclerotic,mild AI w/root dilatation and ca+.  . Nm myocar perf wall motion  05/22/07    no significant ischemia     Current Outpatient Prescriptions  Medication Sig Dispense Refill  . atorvastatin (LIPITOR) 20 MG tablet Take 1 tablet (20 mg total) by mouth daily. 90 tablet 3  . baclofen (LIORESAL) 10 MG tablet Take 1 tablet by mouth 2 (two) times daily.  0  . COUMADIN 5 MG tablet TAKE 1 TABLET DAILY OR AS DIRECTED 90 tablet 1  . gabapentin (NEURONTIN) 300 MG capsule Take 900 mg by mouth daily.     Marland Kitchen HYDROcodone-acetaminophen (NORCO) 10-325 MG per tablet Take 1 tablet by mouth every 6 (six) hours as needed for pain.    Marland Kitchen L-Methylfolate-Algae-B12-B6 (METANX) 3-90.314-2-35 MG CAPS Take 1 capsule by mouth daily.    . metoprolol tartrate (LOPRESSOR) 25 MG tablet TAKE ONE-HALF TABLET (12.5 MG) TWICE A DAY 90 tablet 2  . Thiamine HCl (VITAMIN B-1) 250 MG tablet Take 250 mg by mouth daily.    . valsartan-hydrochlorothiazide (DIOVAN-HCT) 160-12.5 MG per tablet TAKE 1 TABLET DAILY 90 tablet 2  No current facility-administered medications for this visit.    Allergies:   Review of patient's allergies indicates no known allergies.    Social History:  The patient  reports that he quit smoking about 43 years ago. He does not have any smokeless tobacco history on file. He reports that he drinks alcohol. He reports that he does not use illicit drugs.   Family History:  The patient's family history includes Cancer in his father.    ROS:  Please see the history of present  illness.    Otherwise, review of systems positive for none.   All other systems are reviewed and negative.    PHYSICAL EXAM: VS:  BP 120/70 mmHg  Pulse 59  Ht 6\' 4"  (1.93 m)  Wt 334 lb (151.501 kg)  BMI 40.67 kg/m2 , BMI Body mass index is 40.67 kg/(m^2).  General: Alert, oriented x3, no distress Head: no evidence of trauma, PERRL, EOMI, no exophtalmos or lid lag, no myxedema, no xanthelasma; normal ears, nose and oropharynx Neck: normal jugular venous pulsations and no hepatojugular reflux; brisk carotid pulses without delay and no carotid bruits Chest: clear to auscultation, no signs of consolidation by percussion or palpation, normal fremitus, symmetrical and full respiratory excursions Cardiovascular: normal position and quality of the apical impulse, regular rhythm, normal first and second heart sounds, no murmurs, rubs or gallops Abdomen: no tenderness or distention, no masses by palpation, no abnormal pulsatility or arterial bruits, normal bowel sounds, no hepatosplenomegaly Extremities: no clubbing, cyanosis or edema; 2+ radial, ulnar and brachial pulses bilaterally; 2+ right femoral, posterior tibial and dorsalis pedis pulses; 2+ left femoral, posterior tibial and dorsalis pedis pulses; no subclavian or femoral bruits Neurological: grossly nonfocal Psych: euthymic mood, full affect   EKG:  EKG is not ordered today. Recent Labs: 11/19/2013: Hemoglobin 15.2; Platelets 191 10/25/2014: ALT 14; BUN 26*; Creatinine 1.12; Potassium 4.2; Sodium 141    Lipid Panel    Component Value Date/Time   CHOL 173 10/25/2014 0838   TRIG 129 10/25/2014 0838   HDL 65 10/25/2014 0838   CHOLHDL 2.7 10/25/2014 0838   VLDL 26 10/25/2014 0838   LDLCALC 82 10/25/2014 0838      Wt Readings from Last 3 Encounters:  11/15/14 334 lb (151.501 kg)  11/11/14 334 lb (151.501 kg)  08/18/14 335 lb 4.8 oz (152.091 kg)      Other studies Reviewed: Additional studies/ records that were reviewed today  include: Dr. Evette Georges recent note.    ASSESSMENT AND PLAN:  1.  Permanent atrial fibrillation with slow ventricular response on appropriate warfarin anticoagulation  2. Single-chamber permanent pacemaker currently with normal function but with 2 previous episodes of unexplained polarity switch. To hopefully avoid repeat episodes of pacing with high voltage and unnecessary battery drain, switched programming to bipolar sensing with unipolar pacing.  If the phenomenon repeats itself regardless of this change, we'll turn the polarity switch off. He is not pacemaker dependent.  3. OSA on CPAP  Reports virtually 100% compliance with CPAP   4. HTN (hypertension)  Excellent control.   5. Morbid obesity (BMI >40)  Weight loss recommended.   6. Hyperlipidemia  Good lipid profile   Current medicines are reviewed at length with the patient today.  The patient does not have concerns regarding medicines.  The following changes have been made:  no change  Labs/ tests ordered today include:  No orders of the defined types were placed in this encounter.    Patient Instructions  Dr.  Emilliano Dilworth recommends that you schedule a follow-up appointment in: 6 months with pacemaker check.      Mikael Spray, MD  11/15/2014 2:10 PM    Sanda Klein, MD, [facility] [phone] office [phone] pager

## 2014-11-15 NOTE — Patient Instructions (Signed)
Dr. Sallyanne Kuster recommends that you schedule a follow-up appointment in: 6 months with pacemaker check.

## 2014-11-16 DIAGNOSIS — M549 Dorsalgia, unspecified: Secondary | ICD-10-CM | POA: Diagnosis not present

## 2014-11-16 DIAGNOSIS — M488X6 Other specified spondylopathies, lumbar region: Secondary | ICD-10-CM | POA: Diagnosis not present

## 2014-11-16 DIAGNOSIS — M5137 Other intervertebral disc degeneration, lumbosacral region: Secondary | ICD-10-CM | POA: Diagnosis not present

## 2014-11-16 DIAGNOSIS — M47817 Spondylosis without myelopathy or radiculopathy, lumbosacral region: Secondary | ICD-10-CM | POA: Diagnosis not present

## 2014-11-18 LAB — MDC_IDC_ENUM_SESS_TYPE_INCLINIC
Battery Voltage: 2.78 V
Brady Statistic RV Percent Paced: 71 %
Implantable Pulse Generator Model: 5626
Implantable Pulse Generator Serial Number: 7043529
Lead Channel Setting Pacing Amplitude: 2.5 V
Lead Channel Setting Pacing Pulse Width: 0.4 ms
Lead Channel Setting Sensing Sensitivity: 2 mV
MDC IDC MSMT LEADCHNL RV IMPEDANCE VALUE: 563 Ohm
MDC IDC MSMT LEADCHNL RV PACING THRESHOLD AMPLITUDE: 0.5 V
MDC IDC MSMT LEADCHNL RV PACING THRESHOLD PULSEWIDTH: 0.4 ms
MDC IDC MSMT LEADCHNL RV SENSING INTR AMPL: 12 mV

## 2014-11-29 DIAGNOSIS — R899 Unspecified abnormal finding in specimens from other organs, systems and tissues: Secondary | ICD-10-CM | POA: Diagnosis not present

## 2014-11-29 LAB — POCT INR: INR: 2.7

## 2014-11-30 ENCOUNTER — Ambulatory Visit (INDEPENDENT_AMBULATORY_CARE_PROVIDER_SITE_OTHER): Payer: Medicare Other | Admitting: Pharmacist Clinician (PhC)/ Clinical Pharmacy Specialist

## 2014-11-30 DIAGNOSIS — I482 Chronic atrial fibrillation: Secondary | ICD-10-CM

## 2014-11-30 DIAGNOSIS — Z7901 Long term (current) use of anticoagulants: Secondary | ICD-10-CM

## 2014-11-30 DIAGNOSIS — I4821 Permanent atrial fibrillation: Secondary | ICD-10-CM

## 2014-11-30 LAB — BASIC METABOLIC PANEL
BUN: 20 mg/dL (ref 6–23)
CHLORIDE: 102 meq/L (ref 96–112)
CO2: 31 mEq/L (ref 19–32)
Calcium: 9.1 mg/dL (ref 8.4–10.5)
Creat: 1.15 mg/dL (ref 0.50–1.35)
Glucose, Bld: 94 mg/dL (ref 70–99)
POTASSIUM: 4.8 meq/L (ref 3.5–5.3)
SODIUM: 140 meq/L (ref 135–145)

## 2014-12-06 ENCOUNTER — Telehealth: Payer: Self-pay | Admitting: *Deleted

## 2014-12-06 NOTE — Telephone Encounter (Signed)
Returned CPAP supply orders to [employer] via fax to [phone].

## 2014-12-23 ENCOUNTER — Encounter: Payer: Self-pay | Admitting: Cardiovascular Disease

## 2015-01-03 ENCOUNTER — Ambulatory Visit (INDEPENDENT_AMBULATORY_CARE_PROVIDER_SITE_OTHER): Payer: Medicare Other | Admitting: Pharmacist Clinician (PhC)/ Clinical Pharmacy Specialist

## 2015-01-03 DIAGNOSIS — Z7901 Long term (current) use of anticoagulants: Secondary | ICD-10-CM

## 2015-01-03 DIAGNOSIS — I4821 Permanent atrial fibrillation: Secondary | ICD-10-CM

## 2015-01-03 DIAGNOSIS — I482 Chronic atrial fibrillation: Secondary | ICD-10-CM

## 2015-01-03 LAB — PROTIME-INR: INR: 3.5 — AB (ref 0.9–1.1)

## 2015-01-03 LAB — POCT INR: INR: 3.5

## 2015-01-12 ENCOUNTER — Telehealth: Payer: Self-pay | Admitting: *Deleted

## 2015-01-12 NOTE — Telephone Encounter (Signed)
Faxed CPAP supply order to [facility] on 12/08/14.

## 2015-01-25 ENCOUNTER — Ambulatory Visit (INDEPENDENT_AMBULATORY_CARE_PROVIDER_SITE_OTHER): Payer: Medicare Other | Admitting: Pharmacist Clinician (PhC)/ Clinical Pharmacy Specialist

## 2015-01-25 DIAGNOSIS — I482 Chronic atrial fibrillation: Secondary | ICD-10-CM

## 2015-01-25 DIAGNOSIS — Z7901 Long term (current) use of anticoagulants: Secondary | ICD-10-CM

## 2015-01-25 DIAGNOSIS — I4821 Permanent atrial fibrillation: Secondary | ICD-10-CM

## 2015-01-25 LAB — POCT INR: INR: 2.4

## 2015-01-28 DIAGNOSIS — Z7901 Long term (current) use of anticoagulants: Secondary | ICD-10-CM | POA: Diagnosis not present

## 2015-01-28 DIAGNOSIS — M79661 Pain in right lower leg: Secondary | ICD-10-CM | POA: Diagnosis not present

## 2015-01-29 DIAGNOSIS — M7989 Other specified soft tissue disorders: Secondary | ICD-10-CM | POA: Diagnosis not present

## 2015-01-29 DIAGNOSIS — M79604 Pain in right leg: Secondary | ICD-10-CM | POA: Diagnosis not present

## 2015-02-10 ENCOUNTER — Telehealth: Payer: Self-pay | Admitting: Cardiovascular Disease

## 2015-02-10 NOTE — Telephone Encounter (Signed)
Pt of Dr. Claiborne Billings, Dr. Sallyanne Kuster  Had right leg pain which started ~10 days ago while camping in [place].  Was swollen, painful to walk. He went to local ED there. Labs, [place] r/o DVT. He has records of this visit. Noted concern for possible venous insufficiency on [place] report.  Pt notes he had driven up, wife drove the car back to [place]. He states still sore on flexion and ambulation. Notes he is compliant w/ coumadin dosing, compression stockings.  Wants to see if he can be seen by cardiology. Will route to Dr. Claiborne Billings to advise.

## 2015-02-10 NOTE — Telephone Encounter (Signed)
Mr.Dutt is calling because he has been having some severe pain in his (R) leg .Marland Kitchen Please call   Thanks

## 2015-02-14 NOTE — Telephone Encounter (Signed)
Pt on coumadin; ?last INR; he checks himself. Was [place] neg for DVT.  He has venous insufficiency.

## 2015-02-14 NOTE — Telephone Encounter (Signed)
Pt informed of recommendations. Voiced acknowledgement.

## 2015-02-14 NOTE — Telephone Encounter (Signed)
Wear LE compression stocking initially at least 20 - 30  mmHg

## 2015-02-14 NOTE — Telephone Encounter (Signed)
Pt wants to know what to do regarding the venous insufficiency - his understanding was this was new finding.  Does he need to see [place]? Does he need further PV study?

## 2015-02-15 DIAGNOSIS — M79604 Pain in right leg: Secondary | ICD-10-CM | POA: Diagnosis not present

## 2015-02-15 DIAGNOSIS — Z0000 Encounter for general adult medical examination without abnormal findings: Secondary | ICD-10-CM | POA: Diagnosis not present

## 2015-02-15 DIAGNOSIS — I4891 Unspecified atrial fibrillation: Secondary | ICD-10-CM | POA: Diagnosis not present

## 2015-02-15 DIAGNOSIS — I872 Venous insufficiency (chronic) (peripheral): Secondary | ICD-10-CM | POA: Diagnosis not present

## 2015-02-15 DIAGNOSIS — Z7901 Long term (current) use of anticoagulants: Secondary | ICD-10-CM | POA: Diagnosis not present

## 2015-02-15 LAB — POCT INR: INR: 3

## 2015-02-16 ENCOUNTER — Ambulatory Visit (INDEPENDENT_AMBULATORY_CARE_PROVIDER_SITE_OTHER): Payer: Medicare Other | Admitting: Pharmacist Clinician (PhC)/ Clinical Pharmacy Specialist

## 2015-02-16 DIAGNOSIS — Z7901 Long term (current) use of anticoagulants: Secondary | ICD-10-CM

## 2015-02-16 DIAGNOSIS — I482 Chronic atrial fibrillation: Secondary | ICD-10-CM

## 2015-02-16 DIAGNOSIS — I4821 Permanent atrial fibrillation: Secondary | ICD-10-CM

## 2015-02-18 ENCOUNTER — Other Ambulatory Visit: Payer: Self-pay | Admitting: *Deleted

## 2015-02-18 DIAGNOSIS — M79604 Pain in right leg: Secondary | ICD-10-CM

## 2015-03-01 ENCOUNTER — Encounter: Payer: Self-pay | Admitting: Vascular Surgery

## 2015-03-02 ENCOUNTER — Encounter: Payer: Self-pay | Admitting: Vascular Surgery

## 2015-03-02 ENCOUNTER — Ambulatory Visit (INDEPENDENT_AMBULATORY_CARE_PROVIDER_SITE_OTHER): Payer: Medicare Other | Admitting: Vascular Surgery

## 2015-03-02 ENCOUNTER — Ambulatory Visit (HOSPITAL_COMMUNITY)
Admission: RE | Admit: 2015-03-02 | Discharge: 2015-03-02 | Disposition: A | Discharge status: Home / Self Care | Payer: Medicare Other | Source: Ambulatory Visit | Attending: Vascular Surgery | Admitting: Vascular Surgery

## 2015-03-02 VITALS — BP 128/75 | HR 82 | Temp 98.2°F | Resp 18 | Ht 75.0 in | Wt 328.2 lb

## 2015-03-02 DIAGNOSIS — I83811 Varicose veins of right lower extremities with pain: Secondary | ICD-10-CM | POA: Diagnosis not present

## 2015-03-02 DIAGNOSIS — M79604 Pain in right leg: Secondary | ICD-10-CM

## 2015-03-02 DIAGNOSIS — I8391 Asymptomatic varicose veins of right lower extremity: Secondary | ICD-10-CM | POA: Insufficient documentation

## 2015-03-02 NOTE — Progress Notes (Signed)
[facility] [place] HISTORY AND PHYSICAL   History of Present Illness:  Patient is a 79 y.o. year old male who presents for evaluation of chronic right swelling with acute exacerbation over the last few months.  Patient has a several year history of a swollen right leg. However, he states that over the last few months this was worse especially after a recent trip to [place].  He states he had a duplex exam in [place] that showed no evidence of DVT. He also complains of some pain in his right knee. He states the swelling in his leg does improve somewhat with compression. He denies prior history of DVT. But states he may have had an episode of superficial thrombophlebitis in the past. He states he started to have swelling of his right leg after that phlebitis. He has one lower extremity compression stockings for several years. He states that overall these do help his legs somewhat. He initially started wearing some of Dr. Jess Barters silver compression socks for a wound on the right foot in the past. He denies any symptoms of claudication or rest pain. He states that his legs do become restless sometimes at night. Other medical problems include atrial fibrillation, hypertension, sleep apnea requiring CPAP, hyperlipidemia all of which are currently stable. He is on Coumadin for his atrial fibrillation. He also has a pacemaker for his atrial fibrillation.  Past Medical History  Diagnosis Date  . Permanent atrial fibrillation   . Hypertension   . OSA on CPAP   . Hyperlipidemia   . Pacemaker -[place] 2010 05/01/2013  . Atrial fibrillation 10/10/2012  . HTN (hypertension) 05/01/2013    Past Surgical History  Procedure Laterality Date  . Gallbladder surgery  12/2006  . Permanent pacemaker insertion  01/09/2009    [employer]  . US echocardiography  11/24/07    mild LVH,LA & RA mod to severely dilated,mild mitral annular ca+,mild TR,mild Pulmonary hypertensio,AOV mod.  sclerotic,mild AI w/root dilatation and ca+.  . Nm myocar perf wall motion  05/22/07    no significant ischemia  . Carpel tunnel surgery Right 2012    Social History Social History  Substance Use Topics  . Smoking status: Former Smoker    Quit date: 07/23/1971  . Smokeless tobacco: None  . Alcohol Use: 0.0 oz/week    0 Standard drinks or equivalent per week     Comment: 1 large glass per day    Family History Family History  Problem Relation Age of Onset  . Cancer Father     Allergies  No Known Allergies   Current Outpatient Prescriptions  Medication Sig Dispense Refill  . atorvastatin (LIPITOR) 20 MG tablet Take 1 tablet (20 mg total) by mouth daily. 90 tablet 3  . baclofen (LIORESAL) 10 MG tablet Take 1 tablet by mouth 2 (two) times daily.  0  . COUMADIN 5 MG tablet TAKE 1 TABLET DAILY OR AS DIRECTED (Patient taking differently: as directed) 90 tablet 1  . gabapentin (NEURONTIN) 300 MG capsule Take 900 mg by mouth daily.     Marland Kitchen HYDROcodone-acetaminophen (NORCO) 10-325 MG per tablet Take 1 tablet by mouth every 6 (six) hours as needed for pain.    Marland Kitchen L-Methylfolate-Algae-B12-B6 (METANX) 3-90.314-2-35 MG CAPS Take 1 capsule by mouth daily.    . metoprolol tartrate (LOPRESSOR) 25 MG tablet TAKE ONE-HALF TABLET (12.5 MG) TWICE A DAY 90 tablet 2  . Thiamine HCl (VITAMIN B-1) 250 MG tablet Take 250 mg by mouth  daily.    . valsartan-hydrochlorothiazide (DIOVAN-HCT) 160-12.5 MG per tablet TAKE 1 TABLET DAILY 90 tablet 2   No current facility-administered medications for this visit.    ROS:   General:  No weight loss, Fever, chills  HEENT: No recent headaches, no nasal bleeding, no visual changes, no sore throat  Neurologic: No dizziness, blackouts, seizures. No recent symptoms of stroke or mini- stroke. No recent episodes of slurred speech, or temporary blindness.  Cardiac: No recent episodes of chest pain/pressure, no shortness of breath at rest.  + shortness of breath  with exertion.  + history of atrial fibrillation or irregular heartbeat  Vascular: No history of rest pain in feet.  No history of claudication.  No history of non-healing ulcer, No history of DVT   Pulmonary: No home oxygen, no productive cough, no hemoptysis,  No asthma or wheezing  Musculoskeletal:  [ ]  Arthritis, [x ] Low back pain,  [x ] Joint pain  Hematologic:No history of hypercoagulable state.  No history of easy bleeding.  No history of anemia  Gastrointestinal: No hematochezia or melena,  No gastroesophageal reflux, no trouble swallowing  Urinary: [ ]  chronic Kidney disease, [ ]  on HD - [ ]  MWF or [ ]  TTHS, [ ]  Burning with urination, [ ]  Frequent urination, [ ]  Difficulty urinating;   Skin: No rashes  Psychological: No history of anxiety,  No history of depression   Physical Examination  Filed Vitals:   03/02/15 1240  BP: 128/75  Pulse: 82  Temp: 98.2 F (36.8 C)  TempSrc: Oral  Resp: 18  Height: 6\' 3"  (1.905 m)  Weight: 328 lb 3.2 oz (148.871 kg)  SpO2: 96%    Body mass index is 41.02 kg/(m^2).  General:  Alert and oriented, no acute distress HEENT: Normal Neck: No bruit or JVD Pulmonary: Clear to auscultation bilaterally Cardiac: Regular Rate and Rhythm without murmur Abdomen: Soft, non-tender, non-distended, no mass Skin: No rash, multiple scattered small varicosities blisterlike appearance along the medial aspect of his right calf, right lower extremity is approximately 10-15% larger than left, brawny discoloration gaiter area bilaterally Extremity Pulses:  2+ radial, brachial, femoral, dorsalis pedis, posterior tibial pulses bilaterally Musculoskeletal: No deformity trace edema bilaterally  Neurologic: Upper and lower extremity motor 5/5 and symmetric  DATA:  Patient had a venous duplex exam today of the right leg. This showed no evidence of DVT. He did have evidence of superficial and deep vein reflux. Greater saphenous diameter on the right side was  7-10 mm.   ASSESSMENT:  Patient with deep and superficial venous reflux right lower extremity. He is symptomatic with pain and aching and swelling in his right leg. This has become worse over the last several years.   PLAN:  I have recommended the patient continue with his lower extremity compression stockings. He will follow-up in 3 months time. If his symptoms are not improved at that point we will consider laser ablation of his right greater saphenous vein. I did tell the patient today that it was okay for him to continue to exercise. He does have chronic back pain and states that exercise does help with this.  Ruta Hinds, MD [facility] Office: [phone] Pager: [phone]

## 2015-03-10 DIAGNOSIS — M47817 Spondylosis without myelopathy or radiculopathy, lumbosacral region: Secondary | ICD-10-CM | POA: Diagnosis not present

## 2015-03-10 DIAGNOSIS — G894 Chronic pain syndrome: Secondary | ICD-10-CM | POA: Diagnosis not present

## 2015-03-10 DIAGNOSIS — E669 Obesity, unspecified: Secondary | ICD-10-CM | POA: Diagnosis not present

## 2015-03-10 DIAGNOSIS — M488X6 Other specified spondylopathies, lumbar region: Secondary | ICD-10-CM | POA: Diagnosis not present

## 2015-03-10 DIAGNOSIS — M5137 Other intervertebral disc degeneration, lumbosacral region: Secondary | ICD-10-CM | POA: Diagnosis not present

## 2015-03-10 DIAGNOSIS — Z79899 Other long term (current) drug therapy: Secondary | ICD-10-CM | POA: Diagnosis not present

## 2015-03-23 ENCOUNTER — Ambulatory Visit (INDEPENDENT_AMBULATORY_CARE_PROVIDER_SITE_OTHER): Payer: Medicare Other | Admitting: Pharmacist Clinician (PhC)/ Clinical Pharmacy Specialist

## 2015-03-23 ENCOUNTER — Telehealth: Payer: Self-pay | Admitting: Pharmacist Clinician (PhC)/ Clinical Pharmacy Specialist

## 2015-03-23 DIAGNOSIS — I482 Chronic atrial fibrillation: Secondary | ICD-10-CM

## 2015-03-23 DIAGNOSIS — I4821 Permanent atrial fibrillation: Secondary | ICD-10-CM

## 2015-03-23 DIAGNOSIS — Z7901 Long term (current) use of anticoagulants: Secondary | ICD-10-CM

## 2015-03-23 LAB — POCT INR: INR: 2.5

## 2015-03-23 NOTE — Telephone Encounter (Signed)
See anticoag note

## 2015-03-23 NOTE — Telephone Encounter (Signed)
Patient states his INR results this morning is 2.5

## 2015-03-31 DIAGNOSIS — Z125 Encounter for screening for malignant neoplasm of prostate: Secondary | ICD-10-CM | POA: Diagnosis not present

## 2015-03-31 DIAGNOSIS — I4891 Unspecified atrial fibrillation: Secondary | ICD-10-CM | POA: Diagnosis not present

## 2015-03-31 DIAGNOSIS — I10 Essential (primary) hypertension: Secondary | ICD-10-CM | POA: Diagnosis not present

## 2015-03-31 DIAGNOSIS — Z0000 Encounter for general adult medical examination without abnormal findings: Secondary | ICD-10-CM | POA: Diagnosis not present

## 2015-04-06 DIAGNOSIS — Z23 Encounter for immunization: Secondary | ICD-10-CM | POA: Diagnosis not present

## 2015-04-06 DIAGNOSIS — Z0000 Encounter for general adult medical examination without abnormal findings: Secondary | ICD-10-CM | POA: Diagnosis not present

## 2015-04-06 DIAGNOSIS — I872 Venous insufficiency (chronic) (peripheral): Secondary | ICD-10-CM | POA: Diagnosis not present

## 2015-04-06 DIAGNOSIS — I4891 Unspecified atrial fibrillation: Secondary | ICD-10-CM | POA: Diagnosis not present

## 2015-04-11 ENCOUNTER — Encounter: Payer: Self-pay | Admitting: Cardiovascular Disease

## 2015-04-13 ENCOUNTER — Ambulatory Visit (INDEPENDENT_AMBULATORY_CARE_PROVIDER_SITE_OTHER): Payer: Medicare Other | Admitting: Pharmacist Clinician (PhC)/ Clinical Pharmacy Specialist

## 2015-04-13 DIAGNOSIS — I482 Chronic atrial fibrillation: Secondary | ICD-10-CM

## 2015-04-13 DIAGNOSIS — Z7901 Long term (current) use of anticoagulants: Secondary | ICD-10-CM

## 2015-04-13 DIAGNOSIS — I4821 Permanent atrial fibrillation: Secondary | ICD-10-CM

## 2015-04-13 LAB — POCT INR: INR: 3.9

## 2015-04-14 DIAGNOSIS — M1711 Unilateral primary osteoarthritis, right knee: Secondary | ICD-10-CM | POA: Diagnosis not present

## 2015-04-16 ENCOUNTER — Other Ambulatory Visit: Payer: Self-pay | Admitting: Cardiovascular Disease

## 2015-04-18 NOTE — Telephone Encounter (Signed)
Rx request sent to pharmacy.  

## 2015-04-25 ENCOUNTER — Ambulatory Visit (INDEPENDENT_AMBULATORY_CARE_PROVIDER_SITE_OTHER): Payer: Medicare Other | Admitting: Pharmacist Clinician (PhC)/ Clinical Pharmacy Specialist

## 2015-04-25 DIAGNOSIS — I482 Chronic atrial fibrillation: Secondary | ICD-10-CM

## 2015-04-25 DIAGNOSIS — I4821 Permanent atrial fibrillation: Secondary | ICD-10-CM

## 2015-04-25 DIAGNOSIS — Z7901 Long term (current) use of anticoagulants: Secondary | ICD-10-CM

## 2015-04-25 LAB — POCT INR: INR: 2.3

## 2015-05-05 ENCOUNTER — Encounter: Payer: Self-pay | Admitting: Cardiovascular Disease

## 2015-05-05 ENCOUNTER — Ambulatory Visit (INDEPENDENT_AMBULATORY_CARE_PROVIDER_SITE_OTHER): Payer: Medicare Other | Admitting: Cardiovascular Disease

## 2015-05-05 VITALS — BP 136/64 | HR 71 | Ht 75.0 in | Wt 330.0 lb

## 2015-05-05 DIAGNOSIS — G4733 Obstructive sleep apnea (adult) (pediatric): Secondary | ICD-10-CM | POA: Diagnosis not present

## 2015-05-05 DIAGNOSIS — I10 Essential (primary) hypertension: Secondary | ICD-10-CM | POA: Diagnosis not present

## 2015-05-05 DIAGNOSIS — I872 Venous insufficiency (chronic) (peripheral): Secondary | ICD-10-CM

## 2015-05-05 DIAGNOSIS — Z950 Presence of cardiac pacemaker: Secondary | ICD-10-CM

## 2015-05-05 DIAGNOSIS — I4821 Permanent atrial fibrillation: Secondary | ICD-10-CM

## 2015-05-05 DIAGNOSIS — E785 Hyperlipidemia, unspecified: Secondary | ICD-10-CM

## 2015-05-05 DIAGNOSIS — Z9989 Dependence on other enabling machines and devices: Secondary | ICD-10-CM

## 2015-05-05 DIAGNOSIS — I482 Chronic atrial fibrillation: Secondary | ICD-10-CM

## 2015-05-05 NOTE — Patient Instructions (Signed)
Your physician wants you to follow-up in: 6 months or sooner if needed. You will receive a reminder letter in the mail two months in advance. If you don't receive a letter, please call our office to schedule the follow-up appointment. 

## 2015-05-06 DIAGNOSIS — E669 Obesity, unspecified: Secondary | ICD-10-CM | POA: Diagnosis not present

## 2015-05-06 DIAGNOSIS — M1288 Other specific arthropathies, not elsewhere classified, other specified site: Secondary | ICD-10-CM | POA: Diagnosis not present

## 2015-05-06 DIAGNOSIS — G894 Chronic pain syndrome: Secondary | ICD-10-CM | POA: Diagnosis not present

## 2015-05-06 DIAGNOSIS — Z79899 Other long term (current) drug therapy: Secondary | ICD-10-CM | POA: Diagnosis not present

## 2015-05-06 DIAGNOSIS — M47817 Spondylosis without myelopathy or radiculopathy, lumbosacral region: Secondary | ICD-10-CM | POA: Diagnosis not present

## 2015-05-06 DIAGNOSIS — M5137 Other intervertebral disc degeneration, lumbosacral region: Secondary | ICD-10-CM | POA: Diagnosis not present

## 2015-05-07 ENCOUNTER — Encounter: Payer: Self-pay | Admitting: Cardiovascular Disease

## 2015-05-07 DIAGNOSIS — I872 Venous insufficiency (chronic) (peripheral): Secondary | ICD-10-CM | POA: Insufficient documentation

## 2015-05-07 NOTE — Progress Notes (Signed)
Patient ID: Cameron Sharp, male   DOB: [birth year], 79 y.o.   MRN: [id number]    HPI: Cameron Sharp is a 79 y.o. male who presents to the office today for an 6 month follow up cardiology evaluation.  Cameron Sharp has a history of permanent atrial fibrillation and is on chronic Coumadin therapy.  He does home testing of his Coumadin.  He also has a history of hypertension, hyperlipidemia, obstructive sleep apnea on CPAP therapy, and obesity.  In July 2010 he underwent insertion of a [employer] Zephyr single-chamber pacemaker.  He sees Dr. Sallyanne Kuster for pacemaker followup evaluation.  Cameron. Sharp has had difficulty with weight fluctuation with weights ranging from 344 pounds to 271 pounds.  When I saw him in April 2013 his weight had reduced to 271 pounds.  Since that time, his weight has gradually increased and he now weighs approximately 335 pounds.  He has had difficulty with lower back degenerative disc disease, which has limited some of his previous ambulation.  He spent the summer in [place].  He has difficulty with degenerative disc disease of his lumbar spine which has prevented his ambulation.  He also may have some celiac disease and previously had developed diarrhea, but this had improved with a gluten-free diet.  He has obstructive sleep apnea.  His CPAP unit is set at 14 cmwater pressure.  He admits to 100% compliance with CPAP therapy.  His sleep duration is typically greater than 8 hours per night.  He never sleeps without it.  He denies breakthrough snoring.  His sleep is restorative.  He denies excessive daytime sleepiness.  There is no bruxism.  There is no restless legs.    A seven-year follow-up echo Doppler study on 10/11/2014 showed an ejection fraction at 55-60%.  There was moderate aortic sclerosis without stenosis with mild aortic insufficiency.  There was mild mitral regurgitation.  There was biatrial enlargement, left greater than right.  There was trivial pulmonic insufficiency.   Pulmonary pressures were normal.  Since I last saw him, he spent most of the summer in [place].  However, he developed  A swollen right leg.  He underwent duplex exam in [place] which did not reveal DVT. He came back to [facility] early and has been evaluated by Dr. Juanda Crumble feels.  He is felt to have venous insufficiency and has been wearing compression stockings for the past 3 months. Tentative plans are for him to undergo  Laser ablation of his right greater saphenous vein sometime in November. One week ago, he underwent dental extraction by Dr. Loyal Gambler and had held his Coumadin. He continues to use CPAP with 100% compliance. He denies chest pain,   Past Medical History  Diagnosis Date  . Permanent atrial fibrillation ([place])   . Hypertension   . OSA on CPAP   . Hyperlipidemia   . Pacemaker -[place] 2010 05/01/2013  . Atrial fibrillation ([place]) 10/10/2012  . HTN (hypertension) 05/01/2013    Past Surgical History  Procedure Laterality Date  . Gallbladder surgery  12/2006  . Permanent pacemaker insertion  01/09/2009    [employer]  . US echocardiography  11/24/07    mild LVH,LA & RA mod to severely dilated,mild mitral annular ca+,mild TR,mild Pulmonary hypertensio,AOV mod. sclerotic,mild AI w/root dilatation and ca+.  . Nm myocar perf wall motion  05/22/07    no significant ischemia  . Carpel tunnel surgery Right 2012    No Known Allergies  Current Outpatient Prescriptions  Medication Sig Dispense Refill  .  atorvastatin (LIPITOR) 20 MG tablet Take 1 tablet (20 mg total) by mouth daily. 90 tablet 3  . baclofen (LIORESAL) 10 MG tablet Take 1 tablet by mouth 2 (two) times daily.  0  . COUMADIN 5 MG tablet TAKE 1 TABLET DAILY OR AS DIRECTED (Patient taking differently: as directed) 90 tablet 1  . gabapentin (NEURONTIN) 300 MG capsule Take 900 mg by mouth daily.     Marland Kitchen HYDROcodone-acetaminophen (NORCO) 10-325 MG per tablet Take 1 tablet by mouth every 6 (six) hours as needed for  pain.    . metoprolol tartrate (LOPRESSOR) 25 MG tablet TAKE ONE-HALF TABLET (12.5 MG) TWICE A DAY 90 tablet 2  . Thiamine HCl (VITAMIN B-1) 250 MG tablet Take 250 mg by mouth daily.    . valsartan-hydrochlorothiazide (DIOVAN-HCT) 160-12.5 MG per tablet TAKE 1 TABLET DAILY 90 tablet 0   No current facility-administered medications for this visit.    Social History   Social History  . Marital Status: Married    Spouse Name: N/A  . Number of Children: N/A  . Years of Education: N/A   Occupational History  . Not on file.   Social History Main Topics  . Smoking status: Former Smoker    Quit date: 07/23/1971  . Smokeless tobacco: Not on file  . Alcohol Use: 0.0 oz/week    0 Standard drinks or equivalent per week     Comment: 1 large glass per day  . Drug Use: No  . Sexual Activity: Not on file   Other Topics Concern  . Not on file   Social History Narrative    Family History  Problem Relation Age of Onset  . Cancer Father     ROS General: Positive for weight gain No fevers, chills, or night sweats;  HEENT: Negative; No changes in vision or hearing, sinus congestion, difficulty swallowing Pulmonary: Negative; No cough, wheezing, shortness of breath, hemoptysis Cardiovascular: Negative; No chest pain, presyncope, syncope, palpatations Positive for mild edema;  Venous insufficiency GI: Negative; No nausea, vomiting, diarrhea, or abdominal pain GU: Negative; No dysuria, hematuria, or difficulty voiding Musculoskeletal: Positive for low back discomfort due to degenerative disc disease;  no myalgias, joint pain, or weakness Hematologic: Negative; no easy bruising, bleeding Endocrine: Negative; no heat/cold intolerance; no diabetes, Neuro: Positive for degenerative disc disease; no changes in balance, headaches Skin: Negative; No rashes or skin lesions Psychiatric: Negative; No behavioral problems, depression Sleep: Positive for obstructive sleep apnea with CPAP therapy and  100% compliance.  No snoring,  daytime sleepiness, hypersomnolence, bruxism, restless legs, hypnogognic hallucinations. Other comprehensive 14 point system review is negative    Physical Exam BP 136/64 mmHg  Pulse 71  Ht '6\' 3"'  (1.905 m)  Wt 330 lb (149.687 kg)  BMI 41.25 kg/m2   Wt Readings from Last 3 Encounters:  05/05/15 330 lb (149.687 kg)  03/02/15 328 lb 3.2 oz (148.871 kg)  11/15/14 334 lb (151.501 kg)   General: Alert, oriented, no distress.  Skin: normal turgor, no rashes, warm and dry HEENT: Normocephalic, atraumatic. Pupils equal round and reactive to light; sclera anicteric; extraocular muscles intact, No lid lag; Nose without nasal septal hypertrophy; Mouth/Parynx benign; Mallinpatti scale 3 Neck: No JVD, no carotid bruits; normal carotid upstroke Lungs: clear to ausculatation and percussion bilaterally; no wheezing or rales, normal inspiratory and expiratory effort Chest wall: without tenderness to palpitation Heart: PMI not displaced, irregularly irregular rhythm with a controlled ventricular response at 70 bpm compatible with his atrial fibrillation; s1 s2  normal, 1/6 systolic murmur, No diastolic murmur, no rubs, gallops, thrills, or heaves Abdomen: Mild diastases recti soft, nontender; no hepatosplenomehaly, BS+; abdominal aorta nontender and not dilated by palpation. Back: no CVA tenderness Pulses: 2+  Musculoskeletal: full range of motion, normal strength, no joint deformities Extremities: Positive for trace ankle edema with stasis changes to his lower extremities, no clubbing cyanosis, Homan's sign negative  Neurologic: grossly nonfocal; Cranial nerves grossly wnl Psychologic: Normal mood and affect  ECG (independently read by me):  Underlying atrial fibrillation with appropriate V pacing  April 2016ECG (independently read by me): Atrial fibrillation with appropriate V pacing and sensing.  January 2016 ECG (independently read by me): Atrial fibrillation at 67  bpm with nonspecific T changes.  Appropriate V pacing and sensing.  Prior ECG (independently read by me): Underlying atrial fibrillation with appropriate V. pacing and V. sensing.  Ventricular rate 65  LABS:  BMP Latest Ref Rng 11/29/2014 10/25/2014 11/19/2013  Glucose 70 - 99 mg/dL 94 93 95  BUN 6 - 23 mg/dL 20 26(H) 18  Creatinine 0.50 - 1.35 mg/dL 1.15 1.12 0.96  Sodium 135 - 145 mEq/L 140 141 139  Potassium 3.5 - 5.3 mEq/L 4.8 4.2 4.1  Chloride 96 - 112 mEq/L 102 104 103  CO2 19 - 32 mEq/L '31 26 28  ' Calcium 8.4 - 10.5 mg/dL 9.1 9.5 8.9     Hepatic Function Latest Ref Rng 10/25/2014 11/19/2013 05/29/2013  Total Protein 6.0 - 8.3 g/dL 7.2 6.6 7.0  Albumin 3.5 - 5.2 g/dL 4.7 4.2 4.3  AST 0 - 37 U/L '20 23 25  ' ALT 0 - 53 U/L '14 13 14  ' Alk Phosphatase 39 - 117 U/L 41 39 47  Total Bilirubin 0.2 - 1.2 mg/dL 1.4(H) 0.8 1.0    CBC Latest Ref Rng 11/19/2013  WBC 4.0 - 10.5 K/uL 5.9  Hemoglobin 13.0 - 17.0 g/dL 15.2  Hematocrit 39.0 - 52.0 % 44.2  Platelets 150 - 400 K/uL 191   No results found for: TSH  BNP No results found for: PROBNP  Lipid Panel     Component Value Date/Time   CHOL 173 10/25/2014 0838   TRIG 129 10/25/2014 0838   HDL 65 10/25/2014 0838   CHOLHDL 2.7 10/25/2014 0838   VLDL 26 10/25/2014 0838   LDLCALC 82 10/25/2014 0838     RADIOLOGY: Ct Lumbar Spine Wo Contrast  11/10/2013   CLINICAL DATA:  Low back pain.  Left leg weakness.  EXAM: CT LUMBAR SPINE WITHOUT CONTRAST  TECHNIQUE: Multidetector CT imaging of the lumbar spine was performed without intravenous contrast administration. Multiplanar CT image reconstructions were also generated.  COMPARISON:  Lumbar spine radiographs 09/23/2012.  FINDINGS: Mild degenerative scoliosis convex left mid lumbar region. Anatomic alignment except for 2 mm facet mediated slip L5-S1. Advanced disc space narrowing L3-4 and L4-5. No worrisome osseous lesions. Atherosclerosis of the aorta, non aneurysmal. Unremarkable SI joints.  At  L1-L2, there is a large extraforaminal spur to the left. There is some left-sided foraminal narrowing which could affect the L1 nerve root. Slight left lateral recess stenosis without clear-cut L2 nerve root impingement.  At L2-L3, there is mild disc space narrowing. There is moderate facet ligamentum flavum hypertrophy. Mild left and moderate right neural foraminal narrowing could affect the L2 nerve roots. Mild central canal stenosis without definite L3 nerve root impingement.  At L3-L4, there is multifactorial spinal stenosis related to right greater than left disc space narrowing, central protrusion which is partially calcified,  osteophytic spurring, as well as posterior element hypertrophy. Bilateral L3 and L4 nerve root impingement are likely, right worse than left.  At L4-5, there is a large calcified right paracentral central disc extrusion associated with moderate to severe disc space narrowing. Bilateral foraminal narrowing is evident. Advanced posterior element hypertrophy. Right greater than left L5 nerve root impingement in the canal with bilateral L4 nerve root impingement in the foramina.  At L5-S1 there is 2 mm of facet mediated anterolisthesis. There is mild annular bulging. Advanced facet overgrowth. Bilateral L5 nerve root impingement is likely due to foraminal narrowing. No definite S1 nerve root compression in the canal.  IMPRESSION: Multilevel spondylosis as described. Potentially symptomatic left-sided neural compression is observed at L1-2, L3-4, L4-5, and L5-S1. See discussion above.  If further investigation is desired, consider CT myelography with flexion extension views.   Electronically Signed   By: Rolla Flatten M.D.   On: 11/10/2013 14:32      ASSESSMENT AND PLAN: Cameron Sharp is a 79 year old gentleman who has a long-standing history of permanent atrial fibrillation, for which he is on Coumadin anticoagulation.He  Has a permanent [employer] Jude Zephyr single chamber pacemaker,  and this is functioning appropriately.  His blood pressure today is  controlled on combination low-dose metoprolol and valsartan HCT.  I reviewed his laboratory.  He continues to tolerate atorvastatin 20 mg.  There are no myalgias.  He has obstructive sleep apnea and he continues to have 100% compliance with CPAP use with average sleep time greater than 8 hours per night.  He denies residual daytime sleepiness or breakthrough snoring.  He does have multilevel spondylosis as noted on CT of his lumbar spine which has limited his exercise capacity and contributed to his progressive weight gain.  He was recently found developed progressive lower extremity edema.  A DVT was excluded by duplex imaging. He is found to have venous insufficiency with potential laser ablation therapy plan after 3 months of compression stocking utilization. He's not having any chest pain.  He denies bleeding. We again discussed importance of weight loss.  His BMI is now 41.25 compatible with morbid obesity.  I will see him in 6 months for reevaluation.  Time spent: 25 minutes  Troy Sine, MD, [facility]  05/07/2015 9:24 AM

## 2015-05-16 ENCOUNTER — Ambulatory Visit (INDEPENDENT_AMBULATORY_CARE_PROVIDER_SITE_OTHER): Payer: Medicare Other | Admitting: Pharmacist Clinician (PhC)/ Clinical Pharmacy Specialist

## 2015-05-16 DIAGNOSIS — I4891 Unspecified atrial fibrillation: Secondary | ICD-10-CM | POA: Diagnosis not present

## 2015-05-16 DIAGNOSIS — I482 Chronic atrial fibrillation: Secondary | ICD-10-CM

## 2015-05-16 DIAGNOSIS — Z7901 Long term (current) use of anticoagulants: Secondary | ICD-10-CM

## 2015-05-16 DIAGNOSIS — I4821 Permanent atrial fibrillation: Secondary | ICD-10-CM

## 2015-05-16 LAB — POCT INR: INR: 2.7

## 2015-05-17 ENCOUNTER — Ambulatory Visit (INDEPENDENT_AMBULATORY_CARE_PROVIDER_SITE_OTHER): Payer: Medicare Other | Admitting: Cardiovascular Disease

## 2015-05-17 ENCOUNTER — Encounter: Payer: Self-pay | Admitting: Cardiovascular Disease

## 2015-05-17 VITALS — BP 122/72 | HR 65 | Ht 75.0 in | Wt 333.2 lb

## 2015-05-17 DIAGNOSIS — I872 Venous insufficiency (chronic) (peripheral): Secondary | ICD-10-CM | POA: Diagnosis not present

## 2015-05-17 DIAGNOSIS — Z950 Presence of cardiac pacemaker: Secondary | ICD-10-CM | POA: Diagnosis not present

## 2015-05-17 DIAGNOSIS — I4821 Permanent atrial fibrillation: Secondary | ICD-10-CM

## 2015-05-17 DIAGNOSIS — I482 Chronic atrial fibrillation: Secondary | ICD-10-CM | POA: Diagnosis not present

## 2015-05-17 DIAGNOSIS — Z7901 Long term (current) use of anticoagulants: Secondary | ICD-10-CM

## 2015-05-17 DIAGNOSIS — E669 Obesity, unspecified: Secondary | ICD-10-CM

## 2015-05-17 LAB — CUP PACEART INCLINIC DEVICE CHECK
Battery Impedance: 1000 Ohm — CL
Brady Statistic RV Percent Paced: 51 %
Lead Channel Impedance Value: 569 Ohm
Lead Channel Sensing Intrinsic Amplitude: 12 mV
Lead Channel Setting Pacing Pulse Width: 0.4 ms
Lead Channel Setting Sensing Sensitivity: 2 mV
MDC IDC LEAD IMPLANT DT: 20100720
MDC IDC LEAD LOCATION: 753860
MDC IDC LEAD MODEL: 1948
MDC IDC MSMT BATTERY VOLTAGE: 2.79 V
MDC IDC MSMT LEADCHNL RV PACING THRESHOLD AMPLITUDE: 0.5 V
MDC IDC MSMT LEADCHNL RV PACING THRESHOLD PULSEWIDTH: 0.4 ms
MDC IDC SESS DTM: 20161025153948
MDC IDC SET LEADCHNL RV PACING AMPLITUDE: 2.5 V
Pulse Gen Model: 5626
Pulse Gen Serial Number: 7043529

## 2015-05-17 NOTE — Progress Notes (Signed)
Patient ID: Cameron Sharp, male   DOB: December 26, 1935, 79 y.o.   MRN: [id number]     Cardiology Office Note   Date:  05/17/2015   ID:  Cameron Sharp, DOB 09-29-1935, MRN [id number]  PCP:  [name], MD  Cardiologist:  Shelva Majestic, M.D.; Sanda Klein, MD   Chief Complaint  Patient presents with  . Follow-up    pt denied chest pain and SOB      History of Present Illness: Cameron Sharp is a 79 y.o. male who presents for pacemaker follow-up. He has atrial fibrillation with slow ventricular response and a single-chamber St. Jude Zephyr XL device implanted in 2010. He does not have any cardiovascular complaints. He just saw Dr. Claiborne Billings 2 weeks ago. He is compliant with CPAP and is on medications for hypertension and hyperlipidemia. He is scheduled to undergo venous ablation for right lower extremity peripheral venous insufficiency in November. He has been wearing compression stockings for several months without relief. He does  acknowledge that his morbid obesity is a big part of his problems with back pain, but inquires whether his statin may also be responsible for some of his complaints.  He denies exertional dyspnea or angina, syncope, palpitations, bleeding, new focal neurological symptoms.  His pacemaker has previously shown unexplained polarity switches, usually when he travels to [place], where he may be subject to some unidentified electromagnetic interference. After adjustment in programming settings, this has not occurred this year  Past Medical History  Diagnosis Date  . Permanent atrial fibrillation ([place])   . Hypertension   . OSA on CPAP   . Hyperlipidemia   . Pacemaker -[place] 2010 05/01/2013  . Atrial fibrillation ([place]) 10/10/2012  . HTN (hypertension) 05/01/2013    Past Surgical History  Procedure Laterality Date  . Gallbladder surgery  12/2006  . Permanent pacemaker insertion  01/09/2009    St.Jude  . US echocardiography  11/24/07    mild  LVH,LA & RA mod to severely dilated,mild mitral annular ca+,mild TR,mild Pulmonary hypertensio,AOV mod. sclerotic,mild AI w/root dilatation and ca+.  . Nm myocar perf wall motion  05/22/07    no significant ischemia  . Carpel tunnel surgery Right 2012     Current Outpatient Prescriptions  Medication Sig Dispense Refill  . atorvastatin (LIPITOR) 20 MG tablet Take 1 tablet (20 mg total) by mouth daily. 90 tablet 3  . baclofen (LIORESAL) 10 MG tablet Take 1 tablet by mouth 2 (two) times daily.  0  . COUMADIN 5 MG tablet TAKE 1 TABLET DAILY OR AS DIRECTED (Patient taking differently: as directed) 90 tablet 1  . gabapentin (NEURONTIN) 300 MG capsule Take 900 mg by mouth daily.     Marland Kitchen HYDROcodone-acetaminophen (NORCO) 10-325 MG per tablet Take 1 tablet by mouth every 6 (six) hours as needed for pain.    . metoprolol tartrate (LOPRESSOR) 25 MG tablet TAKE ONE-HALF TABLET (12.5 MG) TWICE A DAY 90 tablet 2  . Thiamine HCl (VITAMIN B-1) 250 MG tablet Take 250 mg by mouth daily.    . valsartan-hydrochlorothiazide (DIOVAN-HCT) 160-12.5 MG per tablet TAKE 1 TABLET DAILY 90 tablet 0   No current facility-administered medications for this visit.    Allergies:   Review of patient's allergies indicates no known allergies.    Social History:  The patient  reports that he quit smoking about 43 years ago. He does not have any smokeless tobacco history on file. He reports that he drinks alcohol. He reports that he does not use  illicit drugs.   Family History:  The patient's family history includes Cancer in his father.    ROS:  Please see the history of present illness.    Otherwise, review of systems positive for none.   All other systems are reviewed and negative.    PHYSICAL EXAM: VS:  BP 122/72 mmHg  Pulse 65  Ht 6\' 3"  (1.905 m)  Wt 333 lb 3.2 oz (151.139 kg)  BMI 41.65 kg/m2 , BMI Body mass index is 41.65 kg/(m^2).  General: Alert, oriented x3, no distress. Obesity limits parts of his  physical exam Head: no evidence of trauma, PERRL, EOMI, no exophtalmos or lid lag, no myxedema, no xanthelasma; normal ears, nose and oropharynx Neck: normal jugular venous pulsations and no hepatojugular reflux; brisk carotid pulses without delay and no carotid bruits Chest: clear to auscultation, no signs of consolidation by percussion or palpation, normal fremitus, symmetrical and full respiratory excursions.  Cardiovascular: unable to identify the apical impulse, irregular rhythm, normal first and second heart sounds, no murmurs, rubs or gallops Abdomen: no tenderness or distention, no masses by palpation, no abnormal pulsatility or arterial bruits, normal bowel sounds, no hepatosplenomegaly Extremities: no clubbing, cyanosis or edema; 2+ radial, ulnar and brachial pulses bilaterally; 2+ right femoral, posterior tibial and dorsalis pedis pulses; 2+ left femoral, posterior tibial and dorsalis pedis pulses; no subclavian or femoral bruits Neurological: grossly nonfocal Psych: euthymic mood, full affect   EKG:  EKG is not ordered today.   Recent Labs: 10/25/2014: ALT 14 11/29/2014: BUN 20; Creat 1.15; Potassium 4.8; Sodium 140    Lipid Panel    Component Value Date/Time   CHOL 173 10/25/2014 0838   TRIG 129 10/25/2014 0838   HDL 65 10/25/2014 0838   CHOLHDL 2.7 10/25/2014 0838   VLDL 26 10/25/2014 0838   LDLCALC 82 10/25/2014 0838      Wt Readings from Last 3 Encounters:  05/17/15 333 lb 3.2 oz (151.139 kg)  05/05/15 330 lb (149.687 kg)  03/02/15 328 lb 3.2 oz (148.871 kg)     ASSESSMENT AND PLAN: 1. Single chamber permanent pacemaker with normal function. No new episodes of 36 which have been recorded. He is not pacemaker dependent. His device is not amenable to remote monitoring. He has roughly 51% ventricular pacing and no episodes of high ventricular response 7 recorded. Estimated generator longevity is 6.0-7.75 years. Excellent lead parameters.  2. Permanent atrial  fibrillation with slow ventricular response on appropriate chronic anticoagulation with warfarin. No history of stroke/TIA but elevated embolic risk. CHADSVasc score 3 (age 11, hypertension).  3. Obstructive sleep apnea on CPAP therapy with 100% compliance  4. Back pain is related to multilevel spondylosis of the lumbar spine and morbid obesity. I told him that I don't think a statin is a big part of his complaints, but a 30 day statin holiday is not unreasonable. If his symptoms are not improved substantially, he should immediately restart statin after 30 days. On current therapy he has an excellent lipid profile.  5. Superficial venous insufficiency of the right lower extremity, due for laser vein ablation next month    Current medicines are reviewed at length with the patient today.  The patient does not have concerns regarding medicines.  The following changes have been made:  no change  Labs/ tests ordered today include:  No orders of the defined types were placed in this encounter.    Patient Instructions  Dr. Sallyanne Kuster recommends that you schedule a follow-up appointment in: 6  MONTHS WITH [facility] PACEMAKER CHECK.  GO ON A 30 DAY STATIN (ATORVASTATIN) HOLIDAY.  IF YOUR SYMPTOMS RESOLVE PLEASE CALL THE OFFICE FOR INSTRUCTIONS.  IF THEY DO NOT PLEASE RESTART THE ATORVASTATIN.       Mikael Spray, MD  05/17/2015 2:42 PM    Sanda Klein, MD, [facility] [phone] office [phone] pager

## 2015-05-17 NOTE — Patient Instructions (Signed)
Dr. Sallyanne Kuster recommends that you schedule a follow-up appointment in: [place].  GO ON A 30 DAY STATIN (ATORVASTATIN) HOLIDAY.  IF YOUR SYMPTOMS RESOLVE PLEASE CALL THE OFFICE FOR INSTRUCTIONS.  IF THEY DO NOT PLEASE RESTART THE ATORVASTATIN.

## 2015-06-02 ENCOUNTER — Encounter: Payer: Self-pay | Admitting: Vascular Surgery

## 2015-06-07 ENCOUNTER — Encounter: Payer: Self-pay | Admitting: Vascular Surgery

## 2015-06-07 ENCOUNTER — Encounter: Payer: Self-pay | Admitting: Cardiovascular Disease

## 2015-06-07 ENCOUNTER — Ambulatory Visit (INDEPENDENT_AMBULATORY_CARE_PROVIDER_SITE_OTHER): Payer: Medicare Other | Admitting: Vascular Surgery

## 2015-06-07 VITALS — BP 129/80 | HR 62 | Temp 98.0°F | Resp 18 | Ht 75.0 in | Wt 334.0 lb

## 2015-06-07 DIAGNOSIS — I83811 Varicose veins of right lower extremities with pain: Secondary | ICD-10-CM

## 2015-06-07 DIAGNOSIS — I83819 Varicose veins of unspecified lower extremities with pain: Secondary | ICD-10-CM | POA: Insufficient documentation

## 2015-06-07 NOTE — Progress Notes (Signed)
History of Present Illness  Cameron Sharp is a 79 y.o. male who presents for 3 month follow up evaluation of chronic right swelling with acute exacerbation over the last few months. Per Dr. Oneida Alar note from 03/02/2015.  Patient has a several year history of a swollen right leg. However, he states that over the last few months this was worse especially after a recent trip to [place]. He states he had a duplex exam in [place] that showed no evidence of DVT. He also complains of some pain in his right knee. He states the swelling in his leg does improve somewhat with compression. He denies prior history of DVT. But states he may have had an episode of superficial thrombophlebitis in the past. He states he started to have swelling of his right leg after that phlebitis. He has one lower extremity compression stockings for several years. He states that overall these do help his legs somewhat. He initially started wearing some of Dr. Jess Barters silver compression socks for a wound on the right foot in the past. He denies any symptoms of claudication or rest pain. He states that his legs do become restless sometimes at night. Other medical problems include atrial fibrillation, hypertension, sleep apnea requiring CPAP, hyperlipidemia all of which are currently stable. He is on Coumadin for his atrial fibrillation. He also has a pacemaker for his atrial fibrillation.     He is here to see Dr. Donnetta Hutching in regards to a treatment plan for his superficial and deep vein reflux. Greater saphenous diameter on the right side was 7-10 mm.  He reports no new symptoms and resolved symptoms from his summer trip.     Past Medical History  Diagnosis Date  . Permanent atrial fibrillation ([place])   . Hypertension   . OSA on CPAP   . Hyperlipidemia   . Pacemaker -[place] 2010 05/01/2013  . Atrial fibrillation ([place]) 10/10/2012  . HTN (hypertension) 05/01/2013    Past Surgical History  Procedure Laterality  Date  . Gallbladder surgery  12/2006  . Permanent pacemaker insertion  01/09/2009    [employer]Jude  . US echocardiography  11/24/07    mild LVH,LA & RA mod to severely dilated,mild mitral annular ca+,mild TR,mild Pulmonary hypertensio,AOV mod. sclerotic,mild AI w/root dilatation and ca+.  . Nm myocar perf wall motion  05/22/07    no significant ischemia  . Carpel tunnel surgery Right 2012    Social History   Social History  . Marital Status: Married    Spouse Name: N/A  . Number of Children: N/A  . Years of Education: N/A   Occupational History  . Not on file.   Social History Main Topics  . Smoking status: Former Smoker    Quit date: 07/23/1976  . Smokeless tobacco: Not on file  . Alcohol Use: 0.0 oz/week    0 Standard drinks or equivalent per week     Comment: 1 large glass per day  . Drug Use: No  . Sexual Activity: Not on file   Other Topics Concern  . Not on file   Social History Narrative    Family History  Problem Relation Age of Onset  . Cancer Father     Current Outpatient Prescriptions on File Prior to Visit  Medication Sig Dispense Refill  . baclofen (LIORESAL) 10 MG tablet Take 1 tablet by mouth 2 (two) times daily.  0  . COUMADIN 5 MG tablet TAKE 1 TABLET DAILY OR AS DIRECTED (Patient  taking differently: as directed) 90 tablet 1  . gabapentin (NEURONTIN) 300 MG capsule Take 900 mg by mouth daily.     Marland Kitchen HYDROcodone-acetaminophen (NORCO) 10-325 MG per tablet Take 1 tablet by mouth every 6 (six) hours as needed for pain.    . metoprolol tartrate (LOPRESSOR) 25 MG tablet TAKE ONE-HALF TABLET (12.5 MG) TWICE A DAY 90 tablet 2  . Thiamine HCl (VITAMIN B-1) 250 MG tablet Take 250 mg by mouth daily.    . valsartan-hydrochlorothiazide (DIOVAN-HCT) 160-12.5 MG per tablet TAKE 1 TABLET DAILY 90 tablet 0  . atorvastatin (LIPITOR) 20 MG tablet Take 1 tablet (20 mg total) by mouth daily. (Patient not taking: Reported on 06/07/2015) 90 tablet 3   No current  facility-administered medications on file prior to visit.    Allergies as of 06/07/2015  . (No Known Allergies)     ROS:   General:  No weight loss, Fever, chills  HEENT: No recent headaches, no nasal bleeding, no visual changes, no sore throat  Neurologic: No dizziness, blackouts, seizures. No recent symptoms of stroke or mini- stroke. No recent episodes of slurred speech, or temporary blindness.  Cardiac: No recent episodes of chest pain/pressure, no shortness of breath at rest.  No shortness of breath with exertion.  postitive history of atrial fibrillation or irregular heartbeat  Vascular: No history of rest pain in feet.  No history of claudication.  Right great toe history of non-healing ulcer, No history of DVT   Pulmonary: No home oxygen, no productive cough, no hemoptysis,  No asthma or wheezing  Musculoskeletal:  [x ] Arthritis, [x ] Low back pain,  [ ]  Joint pain  Hematologic:No history of hypercoagulable state.  No history of easy bleeding.  No history of anemia  Gastrointestinal: No hematochezia or melena,  No gastroesophageal reflux, no trouble swallowing  Urinary: [ ]  chronic Kidney disease, [ ]  on HD - [ ]  MWF or [ ]  TTHS, [ ]  Burning with urination, [ ]  Frequent urination, [ ]  Difficulty urinating;   Skin: right LE brawny skin changes  Psychological: No history of anxiety,  No history of depression  Physical Examination  Filed Vitals:   06/07/15 1333  BP: 129/80  Pulse: 62  Temp: 98 F (36.7 C)  TempSrc: Oral  Resp: 18  Height: 6\' 3"  (1.905 m)  Weight: 334 lb (151.501 kg)  SpO2: 96%    Body mass index is 41.75 kg/(m^2).  General:  Alert and oriented, no acute distress HEENT: Normal Neck: No bruit or JVD Pulmonary: non labored breathing Cardiac: Regular Rate and Rhythm without murmur Abdomen: Soft, non-tender, non-distended, no mass, no scars Skin: No rash, brawny discoloration right > left Extremity Pulses:  2+  dorsalis pedis, posterior  tibial pulses bilaterally Musculoskeletal: No deformity, positive right LE edema  Neurologic: Upper and lower extremity motor 5/5 and symmetric  DATA: DATA: Patient had a venous duplex exam today of the right leg. This showed no evidence of DVT. He did have evidence of superficial and deep vein reflux. Greater saphenous diameter on the right side was 7-10 mm.  Ultrasound used and reviewed with DR. Early today in the clinic.  Assessment: Patient with deep and superficial venous reflux right lower extremity. He is symptomatic with pain and aching and swelling in his right leg. This has become worse over the last several years.  Plan: Compression hose daily, not at night.  Check lower legs for wounds daily.  Lower extremity elevation when at rest.  He  does have both deep and superficial venous insurgency.  There are no ulcers on either leg and he is satisfied  with the benefits of compression stockings currently.  Dr. Donnetta Hutching states he would get benefits from laser ablation of the right GSV, but he does not have to undergo the procedure at this time.  The patient has agreed to wait and continue wearing the compression stockings.  He will follow up as needed in the future PRN.  Cameron Sharp, Cameron Sharp [facility]  The patient was seen in conjunction with Dr. Donnetta Hutching today   I have examined the patient, reviewed and agree with above. Reimaged his veins with SonoSite ultrasound. This does show significant enlargement reflux in his right great saphenous vein. Does have severe deep venous reflux as well and therefore would not have correction of venous hypertension with superficial ablation. Feel comfortable with continued observation only and he is comfortable with this as well. This should he develop worsening symptoms  Cameron Jews, MD 06/07/2015 2:19 PM

## 2015-06-10 ENCOUNTER — Ambulatory Visit (INDEPENDENT_AMBULATORY_CARE_PROVIDER_SITE_OTHER): Payer: Medicare Other | Admitting: Pharmacist Clinician (PhC)/ Clinical Pharmacy Specialist

## 2015-06-10 DIAGNOSIS — I4821 Permanent atrial fibrillation: Secondary | ICD-10-CM

## 2015-06-10 DIAGNOSIS — I482 Chronic atrial fibrillation: Secondary | ICD-10-CM

## 2015-06-10 DIAGNOSIS — Z7901 Long term (current) use of anticoagulants: Secondary | ICD-10-CM

## 2015-06-10 LAB — POCT INR: INR: 3

## 2015-06-30 DIAGNOSIS — M5137 Other intervertebral disc degeneration, lumbosacral region: Secondary | ICD-10-CM | POA: Diagnosis not present

## 2015-06-30 DIAGNOSIS — G894 Chronic pain syndrome: Secondary | ICD-10-CM | POA: Diagnosis not present

## 2015-06-30 DIAGNOSIS — M1288 Other specific arthropathies, not elsewhere classified, other specified site: Secondary | ICD-10-CM | POA: Diagnosis not present

## 2015-06-30 DIAGNOSIS — M47817 Spondylosis without myelopathy or radiculopathy, lumbosacral region: Secondary | ICD-10-CM | POA: Diagnosis not present

## 2015-06-30 DIAGNOSIS — M549 Dorsalgia, unspecified: Secondary | ICD-10-CM | POA: Diagnosis not present

## 2015-06-30 DIAGNOSIS — Z79899 Other long term (current) drug therapy: Secondary | ICD-10-CM | POA: Diagnosis not present

## 2015-07-05 ENCOUNTER — Ambulatory Visit (INDEPENDENT_AMBULATORY_CARE_PROVIDER_SITE_OTHER): Payer: Medicare Other | Admitting: Pharmacist Clinician (PhC)/ Clinical Pharmacy Specialist

## 2015-07-05 DIAGNOSIS — I482 Chronic atrial fibrillation: Secondary | ICD-10-CM

## 2015-07-05 DIAGNOSIS — Z7901 Long term (current) use of anticoagulants: Secondary | ICD-10-CM

## 2015-07-05 DIAGNOSIS — I4821 Permanent atrial fibrillation: Secondary | ICD-10-CM

## 2015-07-05 LAB — POCT INR: INR: 1.8

## 2015-07-27 ENCOUNTER — Telehealth: Payer: Self-pay | Admitting: Cardiovascular Disease

## 2015-07-27 NOTE — Telephone Encounter (Signed)
Calling about a physician form that was faxed over on 07/12/15 for Dr. Claiborne Billings. Please call [phone] option 4..   Thanks

## 2015-07-29 ENCOUNTER — Ambulatory Visit (INDEPENDENT_AMBULATORY_CARE_PROVIDER_SITE_OTHER): Payer: Medicare Other | Admitting: Pharmacist Clinician (PhC)/ Clinical Pharmacy Specialist

## 2015-07-29 DIAGNOSIS — Z7901 Long term (current) use of anticoagulants: Secondary | ICD-10-CM

## 2015-07-29 DIAGNOSIS — I4821 Permanent atrial fibrillation: Secondary | ICD-10-CM

## 2015-07-29 DIAGNOSIS — I482 Chronic atrial fibrillation: Secondary | ICD-10-CM

## 2015-07-29 DIAGNOSIS — I4891 Unspecified atrial fibrillation: Secondary | ICD-10-CM | POA: Diagnosis not present

## 2015-07-29 LAB — POCT INR: INR: 1.9

## 2015-08-03 NOTE — Telephone Encounter (Signed)
Order faxed 08/03/15

## 2015-08-05 ENCOUNTER — Other Ambulatory Visit: Payer: Self-pay | Admitting: Cardiovascular Disease

## 2015-08-05 ENCOUNTER — Telehealth: Payer: Self-pay | Admitting: *Deleted

## 2015-08-05 NOTE — Telephone Encounter (Signed)
Faxed INR order to [facility]R [facility].

## 2015-08-29 ENCOUNTER — Ambulatory Visit (INDEPENDENT_AMBULATORY_CARE_PROVIDER_SITE_OTHER): Payer: Medicare Other | Admitting: Pharmacist Clinician (PhC)/ Clinical Pharmacy Specialist

## 2015-08-29 ENCOUNTER — Telehealth: Payer: Self-pay | Admitting: Cardiovascular Disease

## 2015-08-29 DIAGNOSIS — I4821 Permanent atrial fibrillation: Secondary | ICD-10-CM

## 2015-08-29 DIAGNOSIS — Z7901 Long term (current) use of anticoagulants: Secondary | ICD-10-CM

## 2015-08-29 DIAGNOSIS — I482 Chronic atrial fibrillation: Secondary | ICD-10-CM

## 2015-08-29 LAB — POCT INR: INR: 1.5

## 2015-08-29 NOTE — Telephone Encounter (Signed)
New message    Patient calling back with his PT/ INR results

## 2015-08-29 NOTE — Telephone Encounter (Signed)
See anticoag note

## 2015-08-29 NOTE — Telephone Encounter (Signed)
wll forward to kristin, pharm md

## 2015-09-09 DIAGNOSIS — L814 Other melanin hyperpigmentation: Secondary | ICD-10-CM | POA: Diagnosis not present

## 2015-09-09 DIAGNOSIS — I8311 Varicose veins of right lower extremity with inflammation: Secondary | ICD-10-CM | POA: Diagnosis not present

## 2015-09-09 DIAGNOSIS — L218 Other seborrheic dermatitis: Secondary | ICD-10-CM | POA: Diagnosis not present

## 2015-09-09 DIAGNOSIS — D2262 Melanocytic nevi of left upper limb, including shoulder: Secondary | ICD-10-CM | POA: Diagnosis not present

## 2015-09-09 DIAGNOSIS — L570 Actinic keratosis: Secondary | ICD-10-CM | POA: Diagnosis not present

## 2015-09-09 DIAGNOSIS — D2261 Melanocytic nevi of right upper limb, including shoulder: Secondary | ICD-10-CM | POA: Diagnosis not present

## 2015-09-09 DIAGNOSIS — I8312 Varicose veins of left lower extremity with inflammation: Secondary | ICD-10-CM | POA: Diagnosis not present

## 2015-09-09 DIAGNOSIS — L821 Other seborrheic keratosis: Secondary | ICD-10-CM | POA: Diagnosis not present

## 2015-09-12 LAB — POCT INR: INR: 2.1

## 2015-09-15 ENCOUNTER — Ambulatory Visit (INDEPENDENT_AMBULATORY_CARE_PROVIDER_SITE_OTHER): Payer: Medicare Other | Admitting: Pharmacist Clinician (PhC)/ Clinical Pharmacy Specialist

## 2015-09-15 DIAGNOSIS — Z7901 Long term (current) use of anticoagulants: Secondary | ICD-10-CM

## 2015-09-15 DIAGNOSIS — I482 Chronic atrial fibrillation: Secondary | ICD-10-CM

## 2015-09-15 DIAGNOSIS — I4821 Permanent atrial fibrillation: Secondary | ICD-10-CM

## 2015-09-22 DIAGNOSIS — Z79899 Other long term (current) drug therapy: Secondary | ICD-10-CM | POA: Diagnosis not present

## 2015-09-22 DIAGNOSIS — M5137 Other intervertebral disc degeneration, lumbosacral region: Secondary | ICD-10-CM | POA: Diagnosis not present

## 2015-09-22 DIAGNOSIS — M47817 Spondylosis without myelopathy or radiculopathy, lumbosacral region: Secondary | ICD-10-CM | POA: Diagnosis not present

## 2015-09-22 DIAGNOSIS — G894 Chronic pain syndrome: Secondary | ICD-10-CM | POA: Diagnosis not present

## 2015-09-22 DIAGNOSIS — M1288 Other specific arthropathies, not elsewhere classified, other specified site: Secondary | ICD-10-CM | POA: Diagnosis not present

## 2015-09-26 ENCOUNTER — Ambulatory Visit (INDEPENDENT_AMBULATORY_CARE_PROVIDER_SITE_OTHER): Payer: Medicare Other | Admitting: Pharmacist Clinician (PhC)/ Clinical Pharmacy Specialist

## 2015-09-26 DIAGNOSIS — I482 Chronic atrial fibrillation: Secondary | ICD-10-CM

## 2015-09-26 DIAGNOSIS — I4821 Permanent atrial fibrillation: Secondary | ICD-10-CM

## 2015-09-26 DIAGNOSIS — Z7901 Long term (current) use of anticoagulants: Secondary | ICD-10-CM

## 2015-09-26 LAB — POCT INR: INR: 1.7

## 2015-10-03 DIAGNOSIS — M545 Low back pain: Secondary | ICD-10-CM | POA: Diagnosis not present

## 2015-10-04 DIAGNOSIS — Z13220 Encounter for screening for lipoid disorders: Secondary | ICD-10-CM | POA: Diagnosis not present

## 2015-10-04 DIAGNOSIS — I872 Venous insufficiency (chronic) (peripheral): Secondary | ICD-10-CM | POA: Diagnosis not present

## 2015-10-04 DIAGNOSIS — I10 Essential (primary) hypertension: Secondary | ICD-10-CM | POA: Diagnosis not present

## 2015-10-04 DIAGNOSIS — Z0000 Encounter for general adult medical examination without abnormal findings: Secondary | ICD-10-CM | POA: Diagnosis not present

## 2015-10-04 DIAGNOSIS — I4891 Unspecified atrial fibrillation: Secondary | ICD-10-CM | POA: Diagnosis not present

## 2015-10-05 DIAGNOSIS — M545 Low back pain: Secondary | ICD-10-CM | POA: Diagnosis not present

## 2015-10-10 ENCOUNTER — Ambulatory Visit (INDEPENDENT_AMBULATORY_CARE_PROVIDER_SITE_OTHER): Payer: Medicare Other | Admitting: Pharmacist Clinician (PhC)/ Clinical Pharmacy Specialist

## 2015-10-10 DIAGNOSIS — I4821 Permanent atrial fibrillation: Secondary | ICD-10-CM

## 2015-10-10 DIAGNOSIS — M545 Low back pain: Secondary | ICD-10-CM | POA: Diagnosis not present

## 2015-10-10 DIAGNOSIS — I482 Chronic atrial fibrillation: Secondary | ICD-10-CM

## 2015-10-10 DIAGNOSIS — Z7901 Long term (current) use of anticoagulants: Secondary | ICD-10-CM

## 2015-10-10 DIAGNOSIS — Z952 Presence of prosthetic heart valve: Secondary | ICD-10-CM | POA: Diagnosis not present

## 2015-10-10 LAB — PROTIME-INR

## 2015-10-10 LAB — POCT INR: INR: 2.6

## 2015-10-11 DIAGNOSIS — I4891 Unspecified atrial fibrillation: Secondary | ICD-10-CM | POA: Diagnosis not present

## 2015-10-11 DIAGNOSIS — Z0000 Encounter for general adult medical examination without abnormal findings: Secondary | ICD-10-CM | POA: Diagnosis not present

## 2015-10-13 DIAGNOSIS — M545 Low back pain: Secondary | ICD-10-CM | POA: Diagnosis not present

## 2015-10-19 DIAGNOSIS — M545 Low back pain: Secondary | ICD-10-CM | POA: Diagnosis not present

## 2015-10-24 ENCOUNTER — Ambulatory Visit (INDEPENDENT_AMBULATORY_CARE_PROVIDER_SITE_OTHER): Payer: Medicare Other | Admitting: Pharmacist Clinician (PhC)/ Clinical Pharmacy Specialist

## 2015-10-24 DIAGNOSIS — Z7901 Long term (current) use of anticoagulants: Secondary | ICD-10-CM

## 2015-10-24 DIAGNOSIS — I4821 Permanent atrial fibrillation: Secondary | ICD-10-CM

## 2015-10-24 DIAGNOSIS — I482 Chronic atrial fibrillation: Secondary | ICD-10-CM

## 2015-10-24 LAB — POCT INR: INR: 2.4

## 2015-10-25 DIAGNOSIS — M545 Low back pain: Secondary | ICD-10-CM | POA: Diagnosis not present

## 2015-10-27 DIAGNOSIS — M545 Low back pain: Secondary | ICD-10-CM | POA: Diagnosis not present

## 2015-11-01 ENCOUNTER — Other Ambulatory Visit: Payer: Self-pay | Admitting: Cardiovascular Disease

## 2015-11-01 NOTE — Telephone Encounter (Signed)
Rx request sent to pharmacy.  

## 2015-11-01 NOTE — Telephone Encounter (Signed)
REFILL 

## 2015-11-15 ENCOUNTER — Ambulatory Visit (INDEPENDENT_AMBULATORY_CARE_PROVIDER_SITE_OTHER): Payer: Medicare Other | Admitting: Cardiovascular Disease

## 2015-11-15 VITALS — BP 110/63 | HR 61 | Ht 75.0 in | Wt 306.0 lb

## 2015-11-15 DIAGNOSIS — Z9989 Dependence on other enabling machines and devices: Secondary | ICD-10-CM

## 2015-11-15 DIAGNOSIS — Z950 Presence of cardiac pacemaker: Secondary | ICD-10-CM

## 2015-11-15 DIAGNOSIS — Z7901 Long term (current) use of anticoagulants: Secondary | ICD-10-CM

## 2015-11-15 DIAGNOSIS — I4821 Permanent atrial fibrillation: Secondary | ICD-10-CM

## 2015-11-15 DIAGNOSIS — I482 Chronic atrial fibrillation: Secondary | ICD-10-CM

## 2015-11-15 DIAGNOSIS — I10 Essential (primary) hypertension: Secondary | ICD-10-CM

## 2015-11-15 DIAGNOSIS — G4733 Obstructive sleep apnea (adult) (pediatric): Secondary | ICD-10-CM

## 2015-11-15 NOTE — Progress Notes (Signed)
Patient ID: Cameron Sharp, male   DOB: Oct 14, 1935, 80 y.o.   MRN: [id number]    Cardiology Office Note    Date:  11/17/2015   ID:  Cameron Sharp, DOB Feb 11, 1936, MRN [id number]  PCP:  [name], MD  Cardiologist:  Shelva Majestic, M.D;  Sanda Klein, MD   Chief complaint: Pacemaker check   History of Present Illness:  Cameron Sharp is a 80 y.o. male who presents for pacemaker follow-up. He has atrial fibrillation with slow ventricular response and a single-chamber St. Jude Zephyr XL device implanted in 2010.   He describes fatigue, but it's unclear whether this represents deconditioning (he is quite inactive due to back pain). He does have a certain degree of increased ventricular pacing.  He does not have any other cardiovascular complaints.   He is compliant with CPAP and is on medications for hypertension and hyperlipidemia. He continues wearing compression stockings for peripheral venous insufficiency.   He denies exertional dyspnea or angina, syncope, palpitations, bleeding, new focal neurological symptoms. Temporarily stopping his statin did not lead to improvement in his back problems.  His pacemaker has previously shown unexplained polarity switches, usually when he travels to [place], where he may be subject to some unidentified electromagnetic interference. After adjustment in programming settings, this has not occurred.  Generator longevity is estimated at 6-7.5 years, there is 60% ventricular pacing. There have been no episodes of high ventricular rates.  Past Medical History  Diagnosis Date  . Permanent atrial fibrillation ([place])   . Hypertension   . OSA on CPAP   . Hyperlipidemia   . Pacemaker -[place] 2010 05/01/2013  . Atrial fibrillation ([place]) 10/10/2012  . HTN (hypertension) 05/01/2013    Past Surgical History  Procedure Laterality Date  . Gallbladder surgery  12/2006  . Permanent pacemaker insertion  01/09/2009    St.Jude  . US  echocardiography  11/24/07    mild LVH,LA & RA mod to severely dilated,mild mitral annular ca+,mild TR,mild Pulmonary hypertensio,AOV mod. sclerotic,mild AI w/root dilatation and ca+.  . Nm myocar perf wall motion  05/22/07    no significant ischemia  . Carpel tunnel surgery Right 2012    Current Medications: Outpatient Prescriptions Prior to Visit  Medication Sig Dispense Refill  . atorvastatin (LIPITOR) 20 MG tablet TAKE 1 TABLET DAILY 90 tablet 0  . baclofen (LIORESAL) 10 MG tablet Take 1 tablet by mouth 2 (two) times daily.  0  . COUMADIN 5 MG tablet TAKE 1 TABLET DAILY OR AS DIRECTED 90 tablet 0  . gabapentin (NEURONTIN) 300 MG capsule Take 900 mg by mouth daily.     Marland Kitchen HYDROcodone-acetaminophen (NORCO) 10-325 MG per tablet Take 1 tablet by mouth every 6 (six) hours as needed for pain.    . metoprolol tartrate (LOPRESSOR) 25 MG tablet TAKE ONE-HALF (12.5 MG) TABLET TWICE A DAY 90 tablet 0  . Thiamine HCl (VITAMIN B-1) 250 MG tablet Take 250 mg by mouth daily.    . valsartan-hydrochlorothiazide (DIOVAN-HCT) 160-12.5 MG tablet TAKE 1 TABLET DAILY 90 tablet 0   No facility-administered medications prior to visit.     Allergies:   Review of patient's allergies indicates no known allergies.   Social History   Social History  . Marital Status: Married    Spouse Name: N/A  . Number of Children: N/A  . Years of Education: N/A   Social History Main Topics  . Smoking status: Former Smoker    Quit date: 07/23/1976  . Smokeless tobacco: Not  on file  . Alcohol Use: 0.0 oz/week    0 Standard drinks or equivalent per week     Comment: 1 large glass per day  . Drug Use: No  . Sexual Activity: Not on file   Other Topics Concern  . Not on file   Social History Narrative     Family History:  The patient's family history includes Cancer in his father.   ROS:   Please see the history of present illness.    ROS All other systems reviewed and are negative.   PHYSICAL EXAM:   VS:   BP 110/63 mmHg  Pulse 61  Ht 6\' 3"  (1.905 m)  Wt 138.801 kg (306 lb)  BMI 38.25 kg/m2   GEN: Severely obese, well developed, in no acute distress HEENT: normal Neck: no JVD, carotid bruits, or masses Cardiac: Paradoxically split second heart sound, RRR; no murmurs, rubs, or gallops,no edema , healthy pacemaker site Respiratory:  clear to auscultation bilaterally, normal work of breathing GI: soft, nontender, nondistended, + BS MS: no deformity or atrophy Skin: warm and dry, no rash Neuro:  Alert and Oriented x 3, Strength and sensation are intact Psych: euthymic mood, full affect  Wt Readings from Last 3 Encounters:  11/15/15 138.801 kg (306 lb)  06/07/15 151.501 kg (334 lb)  05/17/15 151.139 kg (333 lb 3.2 oz)      Studies/Labs Reviewed:   EKG:  EKG is ordered today.  The ekg ordered today demonstrates Background atrial fibrillation and ventricular pacing  Recent Labs: 11/29/2014: BUN 20; Creat 1.15; Potassium 4.8; Sodium 140   Lipid Panel    Component Value Date/Time   CHOL 173 10/25/2014 0838   TRIG 129 10/25/2014 0838   HDL 65 10/25/2014 0838   CHOLHDL 2.7 10/25/2014 0838   VLDL 26 10/25/2014 0838   LDLCALC 82 10/25/2014 0838    ASSESSMENT:    1. Permanent atrial fibrillation ([place])   2. Pacemaker -[place] 2010   3. Long term current use of anticoagulant therapy   4. Morbid obesity due to excess calories ([place])   5. OSA on CPAP   6. Essential hypertension      PLAN:  In order of problems listed above:  1. AFib with slow ventricular response. He's not have any high ventricular rates in its worth considering complete discontinuation of his current low dose of beta blocker, since this may be contributing to his fatigue 2. PM - normal device function. Unfortunately his device is not amenable to following remotely. Device check in 6 months 3. Warfarin - well-tolerated without bleeding complications 4. Obesity and sleep apnea are likely  contributing to his fatigue and back pain  5. OSA - He reports compliance with CPAP 6. HTN - Blood pressure is well controlled    Medication Adjustments/Labs and Tests Ordered: Current medicines are reviewed at length with the patient today.  Concerns regarding medicines are outlined above.  Medication changes, Labs and Tests ordered today are listed in the Patient Instructions below. Patient Instructions    Your physician wants you to follow-up in: 6 months with Dr Sallyanne Kuster. You will receive a reminder letter in the mail two months in advance. If you don't receive a letter, please call our office to schedule the follow-up appointment.     Mikael Spray, MD  11/17/2015 8:48 PM    [address], [place], [place]  [address] Phone: [phone]; Fax: [phone]

## 2015-11-15 NOTE — Patient Instructions (Signed)
   Your physician wants you to follow-up in: 6 months with Dr Sallyanne Kuster. You will receive a reminder letter in the mail two months in advance. If you don't receive a letter, please call our office to schedule the follow-up appointment.

## 2015-11-17 ENCOUNTER — Encounter: Payer: Self-pay | Admitting: Cardiovascular Disease

## 2015-11-21 ENCOUNTER — Ambulatory Visit (INDEPENDENT_AMBULATORY_CARE_PROVIDER_SITE_OTHER): Payer: Medicare Other | Admitting: Pharmacist

## 2015-11-21 DIAGNOSIS — M79672 Pain in left foot: Secondary | ICD-10-CM | POA: Diagnosis not present

## 2015-11-21 DIAGNOSIS — I482 Chronic atrial fibrillation: Secondary | ICD-10-CM

## 2015-11-21 DIAGNOSIS — Z7901 Long term (current) use of anticoagulants: Secondary | ICD-10-CM

## 2015-11-21 DIAGNOSIS — I4821 Permanent atrial fibrillation: Secondary | ICD-10-CM

## 2015-11-21 LAB — POCT INR: INR: 2.1

## 2015-12-01 ENCOUNTER — Ambulatory Visit: Payer: Medicare Other | Admitting: Cardiovascular Disease

## 2015-12-12 DIAGNOSIS — I87323 Chronic venous hypertension (idiopathic) with inflammation of bilateral lower extremity: Secondary | ICD-10-CM | POA: Diagnosis not present

## 2015-12-12 DIAGNOSIS — M1009 Idiopathic gout, multiple sites: Secondary | ICD-10-CM | POA: Diagnosis not present

## 2015-12-13 DIAGNOSIS — H2513 Age-related nuclear cataract, bilateral: Secondary | ICD-10-CM | POA: Diagnosis not present

## 2015-12-13 DIAGNOSIS — D3131 Benign neoplasm of right choroid: Secondary | ICD-10-CM | POA: Diagnosis not present

## 2015-12-15 DIAGNOSIS — M5137 Other intervertebral disc degeneration, lumbosacral region: Secondary | ICD-10-CM | POA: Diagnosis not present

## 2015-12-15 DIAGNOSIS — M47817 Spondylosis without myelopathy or radiculopathy, lumbosacral region: Secondary | ICD-10-CM | POA: Diagnosis not present

## 2015-12-15 DIAGNOSIS — M1288 Other specific arthropathies, not elsewhere classified, other specified site: Secondary | ICD-10-CM | POA: Diagnosis not present

## 2015-12-19 LAB — POCT INR: INR: 1.9

## 2015-12-20 ENCOUNTER — Ambulatory Visit (INDEPENDENT_AMBULATORY_CARE_PROVIDER_SITE_OTHER): Payer: Medicare Other | Admitting: Pharmacist Clinician (PhC)/ Clinical Pharmacy Specialist

## 2015-12-20 ENCOUNTER — Telehealth: Payer: Self-pay | Admitting: Cardiovascular Disease

## 2015-12-20 DIAGNOSIS — Z7901 Long term (current) use of anticoagulants: Secondary | ICD-10-CM

## 2015-12-20 DIAGNOSIS — I482 Chronic atrial fibrillation: Secondary | ICD-10-CM

## 2015-12-20 DIAGNOSIS — I4821 Permanent atrial fibrillation: Secondary | ICD-10-CM

## 2015-12-20 NOTE — Telephone Encounter (Signed)
New message     Pt self test himself for his INR test 1.9 yesterday     Pt c/o medication issue:  1. Name of Medication: Uloric for gout  2. How are you currently taking this medication (dosage and times per day)? 40 mg po daily  3. Are you having a reaction (difficulty breathing--STAT)? no  4. What is your medication issue? Pt wants to make sure the medications are not interfering with his blood thinner

## 2016-01-10 ENCOUNTER — Ambulatory Visit (INDEPENDENT_AMBULATORY_CARE_PROVIDER_SITE_OTHER): Payer: Medicare Other | Admitting: Pharmacist Clinician (PhC)/ Clinical Pharmacy Specialist

## 2016-01-10 DIAGNOSIS — Z952 Presence of prosthetic heart valve: Secondary | ICD-10-CM | POA: Diagnosis not present

## 2016-01-10 DIAGNOSIS — Z7901 Long term (current) use of anticoagulants: Secondary | ICD-10-CM

## 2016-01-10 DIAGNOSIS — I482 Chronic atrial fibrillation: Secondary | ICD-10-CM

## 2016-01-10 DIAGNOSIS — I4821 Permanent atrial fibrillation: Secondary | ICD-10-CM

## 2016-01-10 LAB — POCT INR: INR: 1.9

## 2016-01-13 DIAGNOSIS — M47817 Spondylosis without myelopathy or radiculopathy, lumbosacral region: Secondary | ICD-10-CM | POA: Diagnosis not present

## 2016-01-13 DIAGNOSIS — M1288 Other specific arthropathies, not elsewhere classified, other specified site: Secondary | ICD-10-CM | POA: Diagnosis not present

## 2016-01-13 DIAGNOSIS — M5137 Other intervertebral disc degeneration, lumbosacral region: Secondary | ICD-10-CM | POA: Diagnosis not present

## 2016-01-13 DIAGNOSIS — G894 Chronic pain syndrome: Secondary | ICD-10-CM | POA: Diagnosis not present

## 2016-01-25 ENCOUNTER — Ambulatory Visit (INDEPENDENT_AMBULATORY_CARE_PROVIDER_SITE_OTHER): Payer: Medicare Other | Admitting: Pharmacist

## 2016-01-25 DIAGNOSIS — I482 Chronic atrial fibrillation: Secondary | ICD-10-CM

## 2016-01-25 DIAGNOSIS — Z7901 Long term (current) use of anticoagulants: Secondary | ICD-10-CM

## 2016-01-25 DIAGNOSIS — I4821 Permanent atrial fibrillation: Secondary | ICD-10-CM

## 2016-01-25 LAB — POCT INR: INR: 2.9

## 2016-01-25 LAB — PROTIME-INR

## 2016-02-01 ENCOUNTER — Ambulatory Visit: Payer: Medicare Other | Admitting: Cardiovascular Disease

## 2016-02-10 DIAGNOSIS — Z79899 Other long term (current) drug therapy: Secondary | ICD-10-CM | POA: Diagnosis not present

## 2016-02-10 DIAGNOSIS — G894 Chronic pain syndrome: Secondary | ICD-10-CM | POA: Diagnosis not present

## 2016-02-10 DIAGNOSIS — M47817 Spondylosis without myelopathy or radiculopathy, lumbosacral region: Secondary | ICD-10-CM | POA: Diagnosis not present

## 2016-02-10 DIAGNOSIS — M5137 Other intervertebral disc degeneration, lumbosacral region: Secondary | ICD-10-CM | POA: Diagnosis not present

## 2016-02-10 DIAGNOSIS — E669 Obesity, unspecified: Secondary | ICD-10-CM | POA: Diagnosis not present

## 2016-02-10 DIAGNOSIS — Z79891 Long term (current) use of opiate analgesic: Secondary | ICD-10-CM | POA: Diagnosis not present

## 2016-02-10 DIAGNOSIS — M1288 Other specific arthropathies, not elsewhere classified, other specified site: Secondary | ICD-10-CM | POA: Diagnosis not present

## 2016-02-13 DIAGNOSIS — M79672 Pain in left foot: Secondary | ICD-10-CM | POA: Diagnosis not present

## 2016-02-13 DIAGNOSIS — I87323 Chronic venous hypertension (idiopathic) with inflammation of bilateral lower extremity: Secondary | ICD-10-CM | POA: Diagnosis not present

## 2016-02-13 DIAGNOSIS — M1711 Unilateral primary osteoarthritis, right knee: Secondary | ICD-10-CM | POA: Diagnosis not present

## 2016-02-13 DIAGNOSIS — M1009 Idiopathic gout, multiple sites: Secondary | ICD-10-CM | POA: Diagnosis not present

## 2016-02-14 ENCOUNTER — Ambulatory Visit (INDEPENDENT_AMBULATORY_CARE_PROVIDER_SITE_OTHER): Payer: Medicare Other | Admitting: Pharmacist Clinician (PhC)/ Clinical Pharmacy Specialist

## 2016-02-14 DIAGNOSIS — I4821 Permanent atrial fibrillation: Secondary | ICD-10-CM

## 2016-02-14 DIAGNOSIS — I482 Chronic atrial fibrillation: Secondary | ICD-10-CM

## 2016-02-14 DIAGNOSIS — Z7901 Long term (current) use of anticoagulants: Secondary | ICD-10-CM

## 2016-02-14 LAB — POCT INR: INR: 2

## 2016-02-15 ENCOUNTER — Other Ambulatory Visit: Payer: Self-pay | Admitting: Cardiovascular Disease

## 2016-02-15 ENCOUNTER — Telehealth: Payer: Self-pay | Admitting: Cardiovascular Disease

## 2016-02-15 MED ORDER — ATORVASTATIN CALCIUM 20 MG PO TABS: 20.0000 mg | ORAL_TABLET | Freq: Every day | ORAL | 0 refills | Status: DC

## 2016-02-15 MED ORDER — METOPROLOL TARTRATE 25 MG PO TABS: ORAL_TABLET | ORAL | 0 refills | Status: DC

## 2016-02-15 MED ORDER — COUMADIN 5 MG PO TABS: ORAL_TABLET | ORAL | 0 refills | Status: DC

## 2016-02-15 MED ORDER — VALSARTAN-HYDROCHLOROTHIAZIDE 160-12.5 MG PO TABS: 1.0000 | ORAL_TABLET | Freq: Every day | ORAL | 0 refills | Status: DC

## 2016-02-15 NOTE — Telephone Encounter (Signed)
Rx sent to pharmacy-pt made aware.

## 2016-02-15 NOTE — Telephone Encounter (Signed)
°*  STAT* If patient is at the pharmacy, call can be transferred to refill team.   1. Which medications need to be refilled? (please list name of each medication and dose if known) ATORASTATIN 20 mg , VALSARTAN- HTZ 160-.12.5, METOPROLOL  25mg , COUMADIN 5mg   NOTHING ELSE.  2. Which pharmacy/location (including street and city if local pharmacy) is medication to be sent to?[facility]   3. Do they need a 30 day or 90 day supply? ALL 90days

## 2016-03-01 ENCOUNTER — Encounter: Payer: Self-pay | Admitting: Cardiovascular Disease

## 2016-03-13 ENCOUNTER — Ambulatory Visit (INDEPENDENT_AMBULATORY_CARE_PROVIDER_SITE_OTHER): Payer: Medicare Other | Admitting: Pharmacist

## 2016-03-13 DIAGNOSIS — Z952 Presence of prosthetic heart valve: Secondary | ICD-10-CM | POA: Diagnosis not present

## 2016-03-13 DIAGNOSIS — I4821 Permanent atrial fibrillation: Secondary | ICD-10-CM

## 2016-03-13 DIAGNOSIS — I482 Chronic atrial fibrillation: Secondary | ICD-10-CM

## 2016-03-13 DIAGNOSIS — Z7901 Long term (current) use of anticoagulants: Secondary | ICD-10-CM

## 2016-03-13 LAB — POCT INR: INR: 2.5

## 2016-03-16 ENCOUNTER — Other Ambulatory Visit: Payer: Self-pay

## 2016-03-23 ENCOUNTER — Ambulatory Visit (INDEPENDENT_AMBULATORY_CARE_PROVIDER_SITE_OTHER): Payer: Medicare Other | Admitting: Cardiovascular Disease

## 2016-03-23 ENCOUNTER — Encounter: Payer: Self-pay | Admitting: Cardiovascular Disease

## 2016-03-23 VITALS — BP 118/74 | HR 60 | Ht 75.0 in | Wt 315.0 lb

## 2016-03-23 DIAGNOSIS — Z7901 Long term (current) use of anticoagulants: Secondary | ICD-10-CM

## 2016-03-23 DIAGNOSIS — I10 Essential (primary) hypertension: Secondary | ICD-10-CM | POA: Diagnosis not present

## 2016-03-23 DIAGNOSIS — I872 Venous insufficiency (chronic) (peripheral): Secondary | ICD-10-CM | POA: Diagnosis not present

## 2016-03-23 DIAGNOSIS — M47817 Spondylosis without myelopathy or radiculopathy, lumbosacral region: Secondary | ICD-10-CM

## 2016-03-23 DIAGNOSIS — G4733 Obstructive sleep apnea (adult) (pediatric): Secondary | ICD-10-CM | POA: Diagnosis not present

## 2016-03-23 DIAGNOSIS — I4821 Permanent atrial fibrillation: Secondary | ICD-10-CM

## 2016-03-23 DIAGNOSIS — I482 Chronic atrial fibrillation: Secondary | ICD-10-CM

## 2016-03-23 DIAGNOSIS — M5137 Other intervertebral disc degeneration, lumbosacral region: Secondary | ICD-10-CM

## 2016-03-23 NOTE — Patient Instructions (Signed)
Your physician wants you to follow-up in: 1 year or sooner If needed. You will receive a reminder letter in the mail two months in advance. If you don't receive a letter, please call our office to schedule the follow-up appointment.  If you need a refill on your cardiac medications before your next appointment, please call your pharmacy.   

## 2016-03-24 NOTE — Progress Notes (Signed)
Patient ID: Cameron Sharp, male   DOB: 09-Nov-1935, 80 y.o.   MRN: [id number]    Primary M.D.: Dr. [name] Pacemaker M.D.: Dr. Sallyanne Kuster  HPI: Cameron Sharp is a 80 y.o. male who presents to the office today for an 4 month follow up cardiology evaluation.  Cameron Sharp has a history of permanent atrial fibrillation and is on chronic Coumadin therapy.  He does home testing of his Coumadin.  He also has a history of hypertension, hyperlipidemia, obstructive sleep apnea on CPAP therapy, and obesity.  In July 2010 he underwent insertion of a [employer] Jude Zephyr single-chamber pacemaker.  He sees Dr. Sallyanne Kuster for pacemaker followup evaluation.  Cameron. Sharp has had difficulty with weight fluctuation with weights ranging from 344 pounds to 271 pounds.  In April 2013 his weight had reduced to 271 pounds.  Since that time, his weight has gradually increased to approximately 335 pounds.  He has had difficulty with lower back degenerative disc disease, which has limited some of his previous ambulation.    He has obstructive sleep apnea.  His CPAP unit is set at 14 cm water pressure.  He admits to 100% compliance with CPAP therapy.  His sleep duration is typically greater than 8 hours per night.  He never sleeps without it.  He denies breakthrough snoring.  His sleep is restorative.  He denies excessive daytime sleepiness.  There is no bruxism.  There is no restless legs.    A seven-year follow-up echo Doppler study on 10/11/2014 showed an ejection fraction at 55-60%.  There was moderate aortic sclerosis without stenosis with mild aortic insufficiency.  There was mild mitral regurgitation.  There was biatrial enlargement, left greater than right.  There was trivial pulmonic insufficiency.  Pulmonary pressures were normal.  When I saw him last year he had spent most of the summer in [place].  However, he developed a swollen right leg and aduplex exam in [place] which did not reveal DVT. He came back to [place].   He has been evaluated by Dr. Oneida Alar and most recently by Dr. early.  There may had been initial plans for laser therapy.  Ultimately, a conservative approach with Dr. Jess Barters silver compression socks was recommended.  At present, he denies chest pain.  He continues to use CPAP and [employer] is his DME company.  Continues to have difficulty with his degenerative disease of his low back, limiting his ambulation.  He continues with home monitoring of his INR and denies recent bleeding.  He tells me Dr. Maudie Mercury will be rechecking blood work.  Past Medical History:  Diagnosis Date  . Atrial fibrillation ([place]) 10/10/2012  . HTN (hypertension) 05/01/2013  . Hyperlipidemia   . Hypertension   . OSA on CPAP   . Pacemaker -[place] 2010 05/01/2013  . Permanent atrial fibrillation [facility])     Past Surgical History:  Procedure Laterality Date  . carpel tunnel surgery Right 2012  . GALLBLADDER SURGERY  12/2006  . NM MYOCAR PERF WALL MOTION  05/22/07   no significant ischemia  . PERMANENT PACEMAKER INSERTION  01/09/2009   [employer]  . US ECHOCARDIOGRAPHY  11/24/07   mild LVH,LA & RA mod to severely dilated,mild mitral annular ca+,mild TR,mild Pulmonary hypertensio,AOV mod. sclerotic,mild AI w/root dilatation and ca+.    No Known Allergies  Current Outpatient Prescriptions  Medication Sig Dispense Refill  . atorvastatin (LIPITOR) 20 MG tablet Take 1 tablet (20 mg total) by mouth daily. 90 tablet 0  .  baclofen (LIORESAL) 10 MG tablet Take 1 tablet by mouth 2 (two) times daily.  0  . COUMADIN 5 MG tablet TAKE 1 TABLET DAILY OR AS DIRECTED 90 tablet 0  . gabapentin (NEURONTIN) 300 MG capsule Take 900 mg by mouth daily.     Marland Kitchen HYDROcodone-acetaminophen (NORCO) 10-325 MG per tablet Take 1 tablet by mouth every 6 (six) hours as needed for pain.    . metoprolol tartrate (LOPRESSOR) 25 MG tablet TAKE ONE-HALF (12.5 MG) TABLET TWICE A DAY 90 tablet 0  . Thiamine HCl (VITAMIN B-1) 250  MG tablet Take 250 mg by mouth daily.    Marland Kitchen ULORIC 40 MG tablet Take 1 tablet by mouth daily.    . valsartan-hydrochlorothiazide (DIOVAN-HCT) 160-12.5 MG tablet Take 1 tablet by mouth daily. 90 tablet 0   No current facility-administered medications for this visit.     Social History   Social History  . Marital status: Married    Spouse name: N/A  . Number of children: N/A  . Years of education: N/A   Occupational History  . Not on file.   Social History Main Topics  . Smoking status: Former Smoker    Quit date: 07/23/1976  . Smokeless tobacco: Not on file  . Alcohol use 0.0 oz/week     Comment: 1 large glass per day  . Drug use: No  . Sexual activity: Not on file   Other Topics Concern  . Not on file   Social History Narrative  . No narrative on file    Family History  Problem Relation Age of Onset  . Cancer Father     ROS General: Positive for weight gain No fevers, chills, or night sweats; Positive for morbid obesity. HEENT: Negative; No changes in vision or hearing, sinus congestion, difficulty swallowing Pulmonary: Negative; No cough, wheezing, shortness of breath, hemoptysis Cardiovascular: Negative; No chest pain, presyncope, syncope, palpatations Positive for mild edema;  Venous insufficiency GI: Negative; No nausea, vomiting, diarrhea, or abdominal pain GU: Negative; No dysuria, hematuria, or difficulty voiding Musculoskeletal: Positive for low back discomfort due to degenerative disc disease;  no myalgias, joint pain, or weakness Hematologic: Negative; no easy bruising, bleeding Endocrine: Negative; no heat/cold intolerance; no diabetes, Neuro: Positive for degenerative disc disease; no changes in balance, headaches Skin: Negative; No rashes or skin lesions Psychiatric: Negative; No behavioral problems, depression Sleep: Positive for obstructive sleep apnea with CPAP therapy and 100% compliance.  No snoring,  daytime sleepiness, hypersomnolence, bruxism,  restless legs, hypnogognic hallucinations. Other comprehensive 14 point system review is negative    Physical Exam BP 118/74 (BP Location: Left Arm, Patient Position: Sitting, Cuff Size: Large)   Pulse 60   Ht '6\' 3"'  (1.905 m)   Wt (!) 315 lb (142.9 kg)   BMI 39.37 kg/m    Wt Readings from Last 3 Encounters:  03/23/16 (!) 315 lb (142.9 kg)  11/15/15 (!) 306 lb (138.8 kg)  06/07/15 (!) 334 lb (151.5 kg)   General: Alert, oriented, no distress.  Skin: normal turgor, no rashes, warm and dry HEENT: Normocephalic, atraumatic. Pupils equal round and reactive to light; sclera anicteric; extraocular muscles intact, No lid lag; Nose without nasal septal hypertrophy; Mouth/Parynx benign; Mallinpatti scale 3 Neck: No JVD, no carotid bruits; normal carotid upstroke Lungs: clear to ausculatation and percussion bilaterally; no wheezing or rales, normal inspiratory and expiratory effort Chest wall: without tenderness to palpitation Heart: PMI not displaced, irregularly irregular rhythm with a controlled ventricular response at 70 bpm  compatible with his atrial fibrillation; s1 s2 normal, 1/6 systolic murmur, No diastolic murmur, no rubs, gallops, thrills, or heaves Abdomen: Mild diastases recti soft, nontender; no hepatosplenomehaly, BS+; abdominal aorta nontender and not dilated by palpation. Back: no CVA tenderness Pulses: 2+  Musculoskeletal: full range of motion, normal strength, no joint deformities Extremities: Positive for trivial ankle edema with stasis changes to his lower extremities, no clubbing cyanosis, Homan's sign negative  Neurologic: grossly nonfocal; Cranial nerves grossly wnl Psychologic: Normal mood and affect  ECG (independently read by me): Ventricular paced rhythm at 60 bpm.  Underlying atrial fibrillation.  October 2016 ECG (independently read by me):  Underlying atrial fibrillation with appropriate V pacing  April 2016 ECG (independently read by me): Atrial fibrillation  with appropriate V pacing and sensing.  January 2016 ECG (independently read by me): Atrial fibrillation at 67 bpm with nonspecific T changes.  Appropriate V pacing and sensing.  Prior ECG (independently read by me): Underlying atrial fibrillation with appropriate V. pacing and V. sensing.  Ventricular rate 65  LABS:  BMP Latest Ref Rng & Units 11/29/2014 10/25/2014 11/19/2013  Glucose 70 - 99 mg/dL 94 93 95  BUN 6 - 23 mg/dL 20 26(H) 18  Creatinine 0.50 - 1.35 mg/dL 1.15 1.12 0.96  Sodium 135 - 145 mEq/L 140 141 139  Potassium 3.5 - 5.3 mEq/L 4.8 4.2 4.1  Chloride 96 - 112 mEq/L 102 104 103  CO2 19 - 32 mEq/L '31 26 28  ' Calcium 8.4 - 10.5 mg/dL 9.1 9.5 8.9     Hepatic Function Latest Ref Rng & Units 10/25/2014 11/19/2013 05/29/2013  Total Protein 6.0 - 8.3 g/dL 7.2 6.6 7.0  Albumin 3.5 - 5.2 g/dL 4.7 4.2 4.3  AST 0 - 37 U/L '20 23 25  ' ALT 0 - 53 U/L '14 13 14  ' Alk Phosphatase 39 - 117 U/L 41 39 47  Total Bilirubin 0.2 - 1.2 mg/dL 1.4(H) 0.8 1.0    CBC Latest Ref Rng & Units 11/19/2013  WBC 4.0 - 10.5 K/uL 5.9  Hemoglobin 13.0 - 17.0 g/dL 15.2  Hematocrit 39.0 - 52.0 % 44.2  Platelets 150 - 400 K/uL 191   No results found for: TSH  BNP No results found for: PROBNP  Lipid Panel     Component Value Date/Time   CHOL 173 10/25/2014 0838   TRIG 129 10/25/2014 0838   HDL 65 10/25/2014 0838   CHOLHDL 2.7 10/25/2014 0838   VLDL 26 10/25/2014 0838   LDLCALC 82 10/25/2014 0838     RADIOLOGY: Ct Lumbar Spine Wo Contrast  11/10/2013   CLINICAL DATA:  Low back pain.  Left leg weakness.  EXAM: CT LUMBAR SPINE WITHOUT CONTRAST  TECHNIQUE: Multidetector CT imaging of the lumbar spine was performed without intravenous contrast administration. Multiplanar CT image reconstructions were also generated.  COMPARISON:  Lumbar spine radiographs 09/23/2012.  FINDINGS: Mild degenerative scoliosis convex left mid lumbar region. Anatomic alignment except for 2 mm facet mediated slip L5-S1. Advanced disc  space narrowing L3-4 and L4-5. No worrisome osseous lesions. Atherosclerosis of the aorta, non aneurysmal. Unremarkable SI joints.  At L1-L2, there is a large extraforaminal spur to the left. There is some left-sided foraminal narrowing which could affect the L1 nerve root. Slight left lateral recess stenosis without clear-cut L2 nerve root impingement.  At L2-L3, there is mild disc space narrowing. There is moderate facet ligamentum flavum hypertrophy. Mild left and moderate right neural foraminal narrowing could affect the L2 nerve roots. Mild  central canal stenosis without definite L3 nerve root impingement.  At L3-L4, there is multifactorial spinal stenosis related to right greater than left disc space narrowing, central protrusion which is partially calcified, osteophytic spurring, as well as posterior element hypertrophy. Bilateral L3 and L4 nerve root impingement are likely, right worse than left.  At L4-5, there is a large calcified right paracentral central disc extrusion associated with moderate to severe disc space narrowing. Bilateral foraminal narrowing is evident. Advanced posterior element hypertrophy. Right greater than left L5 nerve root impingement in the canal with bilateral L4 nerve root impingement in the foramina.  At L5-S1 there is 2 mm of facet mediated anterolisthesis. There is mild annular bulging. Advanced facet overgrowth. Bilateral L5 nerve root impingement is likely due to foraminal narrowing. No definite S1 nerve root compression in the canal.  IMPRESSION: Multilevel spondylosis as described. Potentially symptomatic left-sided neural compression is observed at L1-2, L3-4, L4-5, and L5-S1. See discussion above.  If further investigation is desired, consider CT myelography with flexion extension views.   Electronically Signed   By: Rolla Flatten M.D.   On: 11/10/2013 14:32      ASSESSMENT AND PLAN: Cameron. Renaud Celli is a 80 year old gentleman who has a long-standing history of  permanent atrial fibrillation, for which he is on Coumadin anticoagulation.  He has a Optometrist, and this is functioning appropriately.  His blood pressure today is  controlled on combination low-dose metoprolol and valsartan HCT 160/12.5 mg. He continues to tolerate atorvastatin for hyperlipidemia. There are no myalgias.  He has obstructive sleep apnea and he continues to have 100% compliance with CPAP use with average sleep time greater than 8 hours per night.  He denies residual daytime sleepiness or breakthrough snoring.  His ambulation is limited due to his multilevel spondylosis as noted on CT of his lumbar spine which has limited his exercise capacity and contributed to his progressive weight gain.  Reviewed his recent DBS evaluation by Dr. early November 2016 at which time his lower extremity veins were reimaged by ultrasound.  There is significant enlargement reflux in the right greater saphenous vein and he has severe deep venous reflux.  He was not felt to have correction of venous hypertension, with superficial ablation.  As a result, continued observation and continuance of using the silver compression stockings was recommended.  I again discussed importance of weight loss.  Blood work will be obtained by his primary physician in the last that these be sent to me for my review.  He will also be scheduled to have a follow-up pacemaker evaluation with Dr. Sallyanne Kuster.  I will see him in one year for follow-up cardiology evaluation.  Time spent: 25 minutes  Troy Sine, MD, [facility]  03/24/2016 11:46 AM

## 2016-04-02 DIAGNOSIS — M47817 Spondylosis without myelopathy or radiculopathy, lumbosacral region: Secondary | ICD-10-CM | POA: Diagnosis not present

## 2016-04-02 DIAGNOSIS — G894 Chronic pain syndrome: Secondary | ICD-10-CM | POA: Diagnosis not present

## 2016-04-02 DIAGNOSIS — M5137 Other intervertebral disc degeneration, lumbosacral region: Secondary | ICD-10-CM | POA: Diagnosis not present

## 2016-04-02 DIAGNOSIS — M79606 Pain in leg, unspecified: Secondary | ICD-10-CM | POA: Diagnosis not present

## 2016-04-02 DIAGNOSIS — Z79899 Other long term (current) drug therapy: Secondary | ICD-10-CM | POA: Diagnosis not present

## 2016-04-02 DIAGNOSIS — Z79891 Long term (current) use of opiate analgesic: Secondary | ICD-10-CM | POA: Diagnosis not present

## 2016-04-09 DIAGNOSIS — Z125 Encounter for screening for malignant neoplasm of prostate: Secondary | ICD-10-CM | POA: Diagnosis not present

## 2016-04-09 DIAGNOSIS — I10 Essential (primary) hypertension: Secondary | ICD-10-CM | POA: Diagnosis not present

## 2016-04-09 DIAGNOSIS — Z0000 Encounter for general adult medical examination without abnormal findings: Secondary | ICD-10-CM | POA: Diagnosis not present

## 2016-04-09 DIAGNOSIS — I4891 Unspecified atrial fibrillation: Secondary | ICD-10-CM | POA: Diagnosis not present

## 2016-04-10 ENCOUNTER — Ambulatory Visit (INDEPENDENT_AMBULATORY_CARE_PROVIDER_SITE_OTHER): Payer: Medicare Other | Admitting: Pharmacist

## 2016-04-10 DIAGNOSIS — Z7901 Long term (current) use of anticoagulants: Secondary | ICD-10-CM

## 2016-04-10 DIAGNOSIS — I4821 Permanent atrial fibrillation: Secondary | ICD-10-CM

## 2016-04-10 DIAGNOSIS — I482 Chronic atrial fibrillation: Secondary | ICD-10-CM

## 2016-04-10 LAB — POCT INR: INR: 2.4

## 2016-04-12 DIAGNOSIS — E785 Hyperlipidemia, unspecified: Secondary | ICD-10-CM | POA: Diagnosis not present

## 2016-04-12 DIAGNOSIS — Z23 Encounter for immunization: Secondary | ICD-10-CM | POA: Diagnosis not present

## 2016-04-12 DIAGNOSIS — M549 Dorsalgia, unspecified: Secondary | ICD-10-CM | POA: Diagnosis not present

## 2016-04-18 DIAGNOSIS — M1288 Other specific arthropathies, not elsewhere classified, other specified site: Secondary | ICD-10-CM | POA: Diagnosis not present

## 2016-04-18 DIAGNOSIS — M47817 Spondylosis without myelopathy or radiculopathy, lumbosacral region: Secondary | ICD-10-CM | POA: Diagnosis not present

## 2016-04-18 DIAGNOSIS — M5137 Other intervertebral disc degeneration, lumbosacral region: Secondary | ICD-10-CM | POA: Diagnosis not present

## 2016-05-01 ENCOUNTER — Ambulatory Visit (INDEPENDENT_AMBULATORY_CARE_PROVIDER_SITE_OTHER): Payer: Medicare Other | Admitting: Cardiovascular Disease

## 2016-05-01 VITALS — BP 111/63 | HR 60 | Ht 75.0 in | Wt 313.4 lb

## 2016-05-01 DIAGNOSIS — E7800 Pure hypercholesterolemia, unspecified: Secondary | ICD-10-CM

## 2016-05-01 DIAGNOSIS — E669 Obesity, unspecified: Secondary | ICD-10-CM

## 2016-05-01 DIAGNOSIS — I10 Essential (primary) hypertension: Secondary | ICD-10-CM

## 2016-05-01 DIAGNOSIS — Z950 Presence of cardiac pacemaker: Secondary | ICD-10-CM | POA: Diagnosis not present

## 2016-05-01 DIAGNOSIS — I4821 Permanent atrial fibrillation: Secondary | ICD-10-CM

## 2016-05-01 DIAGNOSIS — I482 Chronic atrial fibrillation: Secondary | ICD-10-CM

## 2016-05-01 DIAGNOSIS — G4733 Obstructive sleep apnea (adult) (pediatric): Secondary | ICD-10-CM

## 2016-05-01 DIAGNOSIS — Z9989 Dependence on other enabling machines and devices: Secondary | ICD-10-CM

## 2016-05-01 MED ORDER — ATORVASTATIN CALCIUM 20 MG PO TABS: 20.0000 mg | ORAL_TABLET | Freq: Every day | ORAL | 3 refills | Status: DC

## 2016-05-01 MED ORDER — METOPROLOL TARTRATE 25 MG PO TABS: ORAL_TABLET | ORAL | 3 refills | Status: DC

## 2016-05-01 MED ORDER — COUMADIN 5 MG PO TABS: ORAL_TABLET | ORAL | 3 refills | Status: DC

## 2016-05-01 MED ORDER — VALSARTAN-HYDROCHLOROTHIAZIDE 160-12.5 MG PO TABS: 1.0000 | ORAL_TABLET | Freq: Every day | ORAL | 3 refills | Status: DC

## 2016-05-01 NOTE — Patient Instructions (Signed)
Dr Croitoru recommends that you schedule a follow-up appointment in 6 months with a pacemaker check. You will receive a reminder letter in the mail two months in advance. If you don't receive a letter, please call our office to schedule the follow-up appointment.  If you need a refill on your cardiac medications before your next appointment, please call your pharmacy. 

## 2016-05-01 NOTE — Progress Notes (Signed)
Cardiology Office Note    Date:  05/01/2016   ID:  Haitham Elrick, DOB Oct 22, 1935, MRN [id number]  PCP:  [name], MD  Cardiologist: Shelva Majestic, M.D.;  Sanda Klein, MD   Chief Complaint  Patient presents with  . Follow-up    pt denied chest pain, pt stated he was playing golf on thursday and he think he pull a muscle in his left leg.    History of Present Illness:  Cameron Sharp is a 80 y.o. male with after with slow ventricular response and a single-chamber [employer] Zephyr pacemaker implanted in 2010, as well a severe obesity, obstructive sleep apnea, hypertension and hyperlipidemia. He saw Dr. Claiborne Billings just a few weeks ago.  He injured his back recently playing golf and has been less active. He is walking with a cane due to pain in his left leg.  Pacemaker interrogation shows normal device function. He has 63% ventricular pacing and there are no episodes of high ventricular rates. Heart rate histograms are slightly blunted, but his activity level has been less than usual. Estimated generator longevity is 5.75-7.25 years. Lead parameters are normal. Polarity switches have been recorded in the past, none since we made adjustments in his device a year ago.  Past Medical History:  Diagnosis Date  . Atrial fibrillation ([place]) 10/10/2012  . HTN (hypertension) 05/01/2013  . Hyperlipidemia   . Hypertension   . OSA on CPAP   . Pacemaker -[place] 2010 05/01/2013  . Permanent atrial fibrillation [facility])     Past Surgical History:  Procedure Laterality Date  . carpel tunnel surgery Right 2012  . GALLBLADDER SURGERY  12/2006  . NM MYOCAR PERF WALL MOTION  05/22/07   no significant ischemia  . PERMANENT PACEMAKER INSERTION  01/09/2009   [employer]  . US ECHOCARDIOGRAPHY  11/24/07   mild LVH,LA & RA mod to severely dilated,mild mitral annular ca+,mild TR,mild Pulmonary hypertensio,AOV mod. sclerotic,mild AI w/root dilatation and ca+.    Current  Medications: Outpatient Medications Prior to Visit  Medication Sig Dispense Refill  . baclofen (LIORESAL) 10 MG tablet Take 1 tablet by mouth 2 (two) times daily.  0  . gabapentin (NEURONTIN) 300 MG capsule Take 900 mg by mouth daily.     Marland Kitchen HYDROcodone-acetaminophen (NORCO) 10-325 MG per tablet Take 1 tablet by mouth every 6 (six) hours as needed for pain.    . Thiamine HCl (VITAMIN B-1) 250 MG tablet Take 250 mg by mouth daily.    Marland Kitchen ULORIC 40 MG tablet Take 1 tablet by mouth daily.    Marland Kitchen atorvastatin (LIPITOR) 20 MG tablet Take 1 tablet (20 mg total) by mouth daily. 90 tablet 0  . COUMADIN 5 MG tablet TAKE 1 TABLET DAILY OR AS DIRECTED 90 tablet 0  . metoprolol tartrate (LOPRESSOR) 25 MG tablet TAKE ONE-HALF (12.5 MG) TABLET TWICE A DAY 90 tablet 0  . valsartan-hydrochlorothiazide (DIOVAN-HCT) 160-12.5 MG tablet Take 1 tablet by mouth daily. 90 tablet 0   No facility-administered medications prior to visit.      Allergies:   Review of patient's allergies indicates no known allergies.   Social History   Social History  . Marital status: Married    Spouse name: N/A  . Number of children: N/A  . Years of education: N/A   Social History Main Topics  . Smoking status: Former Smoker    Quit date: 07/23/1976  . Smokeless tobacco: Not on file  . Alcohol use 0.0 oz/week  Comment: 1 large glass per day  . Drug use: No  . Sexual activity: Not on file   Other Topics Concern  . Not on file   Social History Narrative  . No narrative on file     Family History:  The patient's family history includes Cancer in his father.   ROS:   Please see the history of present illness.    ROS All other systems reviewed and are negative.   PHYSICAL EXAM:   VS:  BP 111/63   Pulse 60   Ht 6\' 3"  (1.905 m)   Wt (!) 313 lb 6.4 oz (142.2 kg)   BMI 39.17 kg/m    GEN: Well nourished, well developed, in no acute distress  HEENT: normal  Neck: no JVD, carotid bruits, or masses Cardiac: irregular;  no murmurs, rubs, or gallops,no edema , LC pacemaker site left subclavian area Respiratory:  clear to auscultation bilaterally, normal work of breathing GI: soft, nontender, nondistended, + BS MS: no deformity or atrophy  Skin: warm and dry, no rash Neuro:  Alert and Oriented x 3, Strength and sensation are intact Psych: euthymic mood, full affect  Wt Readings from Last 3 Encounters:  05/01/16 (!) 313 lb 6.4 oz (142.2 kg)  03/23/16 (!) 315 lb (142.9 kg)  11/15/15 (!) 306 lb (138.8 kg)      Studies/Labs Reviewed:   EKG:  EKG is not ordered today.   Recent Labs: No results found for requested labs within last 8760 hours.   Lipid Panel    Component Value Date/Time   CHOL 173 10/25/2014 0838   TRIG 129 10/25/2014 0838   HDL 65 10/25/2014 0838   CHOLHDL 2.7 10/25/2014 0838   VLDL 26 10/25/2014 0838   LDLCALC 82 10/25/2014 0838    ASSESSMENT:    No diagnosis found.   PLAN:  In order of problems listed above:  1. Afib: Slow ventricular response. Adequate sensor settings by histogram. On appropriate anticoagulation without bleeding complications. 2. PM: Normal pacemaker function. No reprogramming changes necessary. Unfortunately his device is not amenable to remote monitoring. Follow-up in office pacemaker checks every 6 months 3. HTN: Excellent control 4. HLP: Satisfactory lipid profile parameters 5. OSA: Reports compliance with CPAP 6. Obesity: borderline morbidly obese. Weight loss advised.    Medication Adjustments/Labs and Tests Ordered: Current medicines are reviewed at length with the patient today.  Concerns regarding medicines are outlined above.  Medication changes, Labs and Tests ordered today are listed in the Patient Instructions below. Patient Instructions  Dr Sallyanne Kuster recommends that you schedule a follow-up appointment in 6 months with a pacemaker check. You will receive a reminder letter in the mail two months in advance. If you don't receive a letter,  please call our office to schedule the follow-up appointment.  If you need a refill on your cardiac medications before your next appointment, please call your pharmacy.    Signed, Sanda Klein, MD  05/01/2016 1:10 PM    [facility] [address], [place], [place]  [address] Phone: [phone]; Fax: [phone]

## 2016-05-02 ENCOUNTER — Encounter: Payer: Self-pay | Admitting: Cardiovascular Disease

## 2016-05-08 ENCOUNTER — Ambulatory Visit (INDEPENDENT_AMBULATORY_CARE_PROVIDER_SITE_OTHER): Payer: Medicare Other | Admitting: Pharmacist

## 2016-05-08 DIAGNOSIS — G894 Chronic pain syndrome: Secondary | ICD-10-CM | POA: Diagnosis not present

## 2016-05-08 DIAGNOSIS — Z7901 Long term (current) use of anticoagulants: Secondary | ICD-10-CM

## 2016-05-08 DIAGNOSIS — M1288 Other specific arthropathies, not elsewhere classified, other specified site: Secondary | ICD-10-CM | POA: Diagnosis not present

## 2016-05-08 DIAGNOSIS — I482 Chronic atrial fibrillation: Secondary | ICD-10-CM

## 2016-05-08 DIAGNOSIS — M47817 Spondylosis without myelopathy or radiculopathy, lumbosacral region: Secondary | ICD-10-CM | POA: Diagnosis not present

## 2016-05-08 DIAGNOSIS — I4821 Permanent atrial fibrillation: Secondary | ICD-10-CM

## 2016-05-08 DIAGNOSIS — M5137 Other intervertebral disc degeneration, lumbosacral region: Secondary | ICD-10-CM | POA: Diagnosis not present

## 2016-05-08 LAB — POCT INR: INR: 2.5

## 2016-05-24 DIAGNOSIS — M47817 Spondylosis without myelopathy or radiculopathy, lumbosacral region: Secondary | ICD-10-CM | POA: Diagnosis not present

## 2016-05-24 DIAGNOSIS — M1288 Other specific arthropathies, not elsewhere classified, other specified site: Secondary | ICD-10-CM | POA: Diagnosis not present

## 2016-05-24 DIAGNOSIS — M5137 Other intervertebral disc degeneration, lumbosacral region: Secondary | ICD-10-CM | POA: Diagnosis not present

## 2016-06-05 ENCOUNTER — Ambulatory Visit (INDEPENDENT_AMBULATORY_CARE_PROVIDER_SITE_OTHER): Payer: Medicare Other | Admitting: Pharmacist Clinician (PhC)/ Clinical Pharmacy Specialist

## 2016-06-05 DIAGNOSIS — I4821 Permanent atrial fibrillation: Secondary | ICD-10-CM

## 2016-06-05 DIAGNOSIS — I482 Chronic atrial fibrillation: Secondary | ICD-10-CM

## 2016-06-05 DIAGNOSIS — Z7901 Long term (current) use of anticoagulants: Secondary | ICD-10-CM

## 2016-06-05 LAB — POCT INR: INR: 2.8

## 2016-06-21 DIAGNOSIS — M5137 Other intervertebral disc degeneration, lumbosacral region: Secondary | ICD-10-CM | POA: Diagnosis not present

## 2016-06-21 DIAGNOSIS — G894 Chronic pain syndrome: Secondary | ICD-10-CM | POA: Diagnosis not present

## 2016-06-21 DIAGNOSIS — Z79899 Other long term (current) drug therapy: Secondary | ICD-10-CM | POA: Diagnosis not present

## 2016-06-21 DIAGNOSIS — Z79891 Long term (current) use of opiate analgesic: Secondary | ICD-10-CM | POA: Diagnosis not present

## 2016-07-03 ENCOUNTER — Ambulatory Visit (INDEPENDENT_AMBULATORY_CARE_PROVIDER_SITE_OTHER): Payer: Medicare Other | Admitting: Pharmacist Clinician (PhC)/ Clinical Pharmacy Specialist

## 2016-07-03 ENCOUNTER — Telehealth: Payer: Self-pay | Admitting: Cardiovascular Disease

## 2016-07-03 ENCOUNTER — Encounter: Payer: Self-pay | Admitting: Cardiovascular Disease

## 2016-07-03 DIAGNOSIS — I4821 Permanent atrial fibrillation: Secondary | ICD-10-CM

## 2016-07-03 DIAGNOSIS — I482 Chronic atrial fibrillation: Secondary | ICD-10-CM

## 2016-07-03 DIAGNOSIS — Z7901 Long term (current) use of anticoagulants: Secondary | ICD-10-CM

## 2016-07-03 DIAGNOSIS — Z952 Presence of prosthetic heart valve: Secondary | ICD-10-CM | POA: Diagnosis not present

## 2016-07-03 LAB — POCT INR: INR: 1.9

## 2016-07-03 NOTE — Telephone Encounter (Signed)
INR/Prothrombin Time--pt calling with his at home INR result-1.9 --also wanted to mention that he has been on a diet for a month and has been eating a lot of leafy greens     Pt [phone]

## 2016-07-03 NOTE — Telephone Encounter (Signed)
See anticoag note

## 2016-07-12 DIAGNOSIS — M549 Dorsalgia, unspecified: Secondary | ICD-10-CM | POA: Diagnosis not present

## 2016-07-12 DIAGNOSIS — Z79899 Other long term (current) drug therapy: Secondary | ICD-10-CM | POA: Diagnosis not present

## 2016-07-12 DIAGNOSIS — M5137 Other intervertebral disc degeneration, lumbosacral region: Secondary | ICD-10-CM | POA: Diagnosis not present

## 2016-07-12 DIAGNOSIS — M47817 Spondylosis without myelopathy or radiculopathy, lumbosacral region: Secondary | ICD-10-CM | POA: Diagnosis not present

## 2016-07-12 DIAGNOSIS — Z79891 Long term (current) use of opiate analgesic: Secondary | ICD-10-CM | POA: Diagnosis not present

## 2016-07-12 DIAGNOSIS — G894 Chronic pain syndrome: Secondary | ICD-10-CM | POA: Diagnosis not present

## 2016-07-18 ENCOUNTER — Other Ambulatory Visit (INDEPENDENT_AMBULATORY_CARE_PROVIDER_SITE_OTHER): Payer: Self-pay | Admitting: *Deleted

## 2016-07-18 NOTE — Telephone Encounter (Signed)
Patient originally our patient, can we go ahead and refill uloric?

## 2016-07-18 NOTE — Telephone Encounter (Signed)
Pt needs medication refill of uloric 40 mg. Pt uses [facility] on [address]

## 2016-07-19 MED ORDER — ULORIC 40 MG PO TABS: 40.0000 mg | ORAL_TABLET | Freq: Every day | ORAL | 6 refills | Status: DC

## 2016-07-24 ENCOUNTER — Ambulatory Visit (INDEPENDENT_AMBULATORY_CARE_PROVIDER_SITE_OTHER): Payer: Medicare Other | Admitting: Pharmacist

## 2016-07-24 DIAGNOSIS — Z7901 Long term (current) use of anticoagulants: Secondary | ICD-10-CM

## 2016-07-24 DIAGNOSIS — I4821 Permanent atrial fibrillation: Secondary | ICD-10-CM

## 2016-07-24 DIAGNOSIS — I482 Chronic atrial fibrillation: Secondary | ICD-10-CM

## 2016-07-24 LAB — PROTIME-INR: INR: 1.6 — AB (ref <=1.1)

## 2016-08-07 ENCOUNTER — Ambulatory Visit (INDEPENDENT_AMBULATORY_CARE_PROVIDER_SITE_OTHER): Payer: Medicare Other | Admitting: Pharmacist Clinician (PhC)/ Clinical Pharmacy Specialist

## 2016-08-07 DIAGNOSIS — I4821 Permanent atrial fibrillation: Secondary | ICD-10-CM

## 2016-08-07 DIAGNOSIS — Z7901 Long term (current) use of anticoagulants: Secondary | ICD-10-CM

## 2016-08-07 DIAGNOSIS — I482 Chronic atrial fibrillation: Secondary | ICD-10-CM

## 2016-08-07 LAB — POCT INR: INR: 1.6

## 2016-08-15 DIAGNOSIS — L304 Erythema intertrigo: Secondary | ICD-10-CM | POA: Diagnosis not present

## 2016-08-16 DIAGNOSIS — Z79899 Other long term (current) drug therapy: Secondary | ICD-10-CM | POA: Diagnosis not present

## 2016-08-16 DIAGNOSIS — M549 Dorsalgia, unspecified: Secondary | ICD-10-CM | POA: Diagnosis not present

## 2016-08-16 DIAGNOSIS — Z79891 Long term (current) use of opiate analgesic: Secondary | ICD-10-CM | POA: Diagnosis not present

## 2016-08-16 DIAGNOSIS — M47816 Spondylosis without myelopathy or radiculopathy, lumbar region: Secondary | ICD-10-CM | POA: Diagnosis not present

## 2016-08-16 DIAGNOSIS — G894 Chronic pain syndrome: Secondary | ICD-10-CM | POA: Diagnosis not present

## 2016-08-20 ENCOUNTER — Telehealth: Payer: Self-pay | Admitting: Pharmacist Clinician (PhC)/ Clinical Pharmacy Specialist

## 2016-08-20 NOTE — Telephone Encounter (Signed)
Patient called, concerned because of ongoing yeast/fungal infection.  Was not cleared up with topical agents and his dermatologist was recommending fluconazole.  They were concerned about interaction with warfarin.  Assured patient that we can work with him and adjust his warfarin dose accordingly so that he may take the fluconazole.  Patient will contact his dermatologist and let us know when he starts treatment.

## 2016-08-21 ENCOUNTER — Ambulatory Visit (INDEPENDENT_AMBULATORY_CARE_PROVIDER_SITE_OTHER): Payer: Medicare Other | Admitting: Pharmacist Clinician (PhC)/ Clinical Pharmacy Specialist

## 2016-08-21 DIAGNOSIS — I4821 Permanent atrial fibrillation: Secondary | ICD-10-CM

## 2016-08-21 DIAGNOSIS — Z7901 Long term (current) use of anticoagulants: Secondary | ICD-10-CM

## 2016-08-21 DIAGNOSIS — I482 Chronic atrial fibrillation: Secondary | ICD-10-CM

## 2016-08-21 LAB — POCT INR: INR: 1.9

## 2016-08-22 ENCOUNTER — Telehealth: Payer: Self-pay | Admitting: Pharmacist Clinician (PhC)/ Clinical Pharmacy Specialist

## 2016-08-22 NOTE — Telephone Encounter (Signed)
Patient to start fluconazole 200 mg qd x 14 days today.  Have asked that he cut warfarin dose from 5 mg qd x 7.5 mg Tu/Sat to 5 mg qd x 2.5 mg MF.  He will check INR on Monday and call in results.

## 2016-08-27 ENCOUNTER — Ambulatory Visit (INDEPENDENT_AMBULATORY_CARE_PROVIDER_SITE_OTHER): Payer: Medicare Other | Admitting: Pharmacist

## 2016-08-27 DIAGNOSIS — I482 Chronic atrial fibrillation: Secondary | ICD-10-CM

## 2016-08-27 DIAGNOSIS — I4821 Permanent atrial fibrillation: Secondary | ICD-10-CM

## 2016-08-27 DIAGNOSIS — Z7901 Long term (current) use of anticoagulants: Secondary | ICD-10-CM

## 2016-08-27 DIAGNOSIS — Z952 Presence of prosthetic heart valve: Secondary | ICD-10-CM | POA: Diagnosis not present

## 2016-08-27 LAB — PROTIME-INR: INR: 2.8 — AB (ref 0.9–1.1)

## 2016-08-31 ENCOUNTER — Ambulatory Visit (INDEPENDENT_AMBULATORY_CARE_PROVIDER_SITE_OTHER): Payer: Medicare Other | Admitting: Pharmacist

## 2016-08-31 DIAGNOSIS — Z7901 Long term (current) use of anticoagulants: Secondary | ICD-10-CM

## 2016-08-31 DIAGNOSIS — I482 Chronic atrial fibrillation: Secondary | ICD-10-CM

## 2016-08-31 DIAGNOSIS — I4821 Permanent atrial fibrillation: Secondary | ICD-10-CM

## 2016-08-31 LAB — PROTIME-INR: INR: 3.8 — AB (ref 0.9–1.1)

## 2016-09-05 ENCOUNTER — Ambulatory Visit (INDEPENDENT_AMBULATORY_CARE_PROVIDER_SITE_OTHER): Payer: Medicare Other | Admitting: Pharmacist

## 2016-09-05 DIAGNOSIS — I4821 Permanent atrial fibrillation: Secondary | ICD-10-CM

## 2016-09-05 DIAGNOSIS — Z7901 Long term (current) use of anticoagulants: Secondary | ICD-10-CM

## 2016-09-05 DIAGNOSIS — I482 Chronic atrial fibrillation: Secondary | ICD-10-CM

## 2016-09-05 LAB — PROTIME-INR: INR: 2.9 — AB (ref 0.9–1.1)

## 2016-09-06 DIAGNOSIS — L723 Sebaceous cyst: Secondary | ICD-10-CM | POA: Diagnosis not present

## 2016-09-06 DIAGNOSIS — D485 Neoplasm of uncertain behavior of skin: Secondary | ICD-10-CM | POA: Diagnosis not present

## 2016-09-06 DIAGNOSIS — C632 Malignant neoplasm of scrotum: Secondary | ICD-10-CM | POA: Diagnosis not present

## 2016-09-13 DIAGNOSIS — C632 Malignant neoplasm of scrotum: Secondary | ICD-10-CM | POA: Diagnosis not present

## 2016-09-13 DIAGNOSIS — L304 Erythema intertrigo: Secondary | ICD-10-CM | POA: Diagnosis not present

## 2016-09-14 ENCOUNTER — Telehealth: Payer: Self-pay | Admitting: Oncology

## 2016-09-14 NOTE — Telephone Encounter (Signed)
Appt scheduled for th pt to see Dr. Benay Spice on 2/26 at 330pm. Pt aware to arrive 30 minutes early.

## 2016-09-17 ENCOUNTER — Ambulatory Visit (INDEPENDENT_AMBULATORY_CARE_PROVIDER_SITE_OTHER): Payer: Medicare Other | Admitting: Pharmacist

## 2016-09-17 ENCOUNTER — Ambulatory Visit (HOSPITAL_BASED_OUTPATIENT_CLINIC_OR_DEPARTMENT_OTHER): Payer: Medicare Other | Admitting: Oncology

## 2016-09-17 VITALS — BP 138/76 | HR 98 | Temp 98.0°F | Resp 17 | Wt 279.8 lb

## 2016-09-17 DIAGNOSIS — Z7901 Long term (current) use of anticoagulants: Secondary | ICD-10-CM

## 2016-09-17 DIAGNOSIS — I4891 Unspecified atrial fibrillation: Secondary | ICD-10-CM

## 2016-09-17 DIAGNOSIS — C632 Malignant neoplasm of scrotum: Secondary | ICD-10-CM

## 2016-09-17 DIAGNOSIS — I4821 Permanent atrial fibrillation: Secondary | ICD-10-CM

## 2016-09-17 DIAGNOSIS — I482 Chronic atrial fibrillation: Secondary | ICD-10-CM

## 2016-09-17 LAB — PROTIME-INR: INR: 1.5 — AB (ref <=1.1)

## 2016-09-17 NOTE — Progress Notes (Signed)
Cameron Sharp   Referring MD: Antuane Kubesh 81 y.o.  Jul 16, 1936    Reason for Referral: Paget's disease of the scrotum   HPI: Cameron Sharp reports noting an erythematous area at the right upper scrotum for the past 2 years. He saw Dr. Ubaldo Glassing on 09/06/2016. An ill-defined patch of intense erythema was noted at the right lateral scrotum. A biopsy was performed. The pathology [phone]) revealed extramammary Paget disease. Stains for cytokeratin were positive. He reports feeling well.  Past Medical History:  Diagnosis Date  . Atrial fibrillation ([place]) 10/10/2012  . HTN (hypertension) 05/01/2013  . Hyperlipidemia   . Hypertension   . OSA on CPAP   . Pacemaker -[place] 2010 05/01/2013  . Permanent atrial fibrillation [facility] [place])     Past Surgical History:  Procedure Laterality Date  . carpel tunnel surgery Right 2012  . GALLBLADDER SURGERY  12/2006  . NM MYOCAR PERF WALL MOTION  05/22/07   no significant ischemia  . PERMANENT PACEMAKER INSERTION  01/09/2009   [employer]  . US ECHOCARDIOGRAPHY  11/24/07   mild LVH,LA & RA mod to severely dilated,mild mitral annular ca+,mild TR,mild Pulmonary hypertensio,AOV mod. sclerotic,mild AI w/root dilatation and ca+.    Medications: Reviewed  Allergies: No Known Allergies  Family history: His father had gastric cancer at age 12. His paternal grandmother died of breast cancer. His mother died of lung cancer. His paternal grandfather had prostate cancer. He has one sister. No other family history of cancer.  Social History:   He lives in [place]. He is a retired Psychiatrist. He quit smoking cigarettes in 1972. He has one liquor drinks per day.      ROS:   Positives include: Intentional weight loss with dieting, constipation when he takes pain medication, left foot drop, right leg venous insufficiency, chronic back pain  A complete ROS was otherwise  negative.  Physical Exam:  Blood pressure 138/76, pulse 98, temperature 98 F (36.7 C), temperature source Oral, resp. rate 17, weight 279 lb 12.8 oz (126.9 kg), SpO2 98 %.  HEENT: Oropharynx without visible mass, neck without mass Lungs: Clear bilaterally Cardiac: Regular rate and rhythm Abdomen: No hepatosplenomegaly, no mass, nontender GU: Testes without mass, prominence of the left compared to the right epididymis.  Vascular: Support stockings in place at the lower leg bilaterally, no edema Lymph nodes: No cervical, supraclavicular, axillary, inguinal, or femoral nodes Neurologic: Alert and oriented, the motor exam appears intact in the upper and lower extremities Skin: Multiple benign appearing moles over the trunk, at the upper lateral right scrotum there is a patch of erythema measuring 2-3 cm with an associated small ecchymosis and slight nodularity at the superior aspect. Mild yeast rash in the inguinal skin fold bilaterally Musculoskeletal: No spine tenderness    Assessment/Plan:   1. Paget's disease of the scrotum  2. Atrial fibrillation-maintained on Coumadin  3.   Sleep apnea  4.   Chronic back pain   Disposition:   Cameron Sharp has been diagnosed with Paget's disease of the scrotum. There is other apparent primary tumor site on review of his history and exam today. No clinical evidence of local or distant metastatic disease. I discussed the rare nature of this diagnosis with Cameron Sharp. I recommend staging CT scans. If the CT scans were negative he should undergo surgical excision.  I referred Cameron Sharp to Dr. Alinda Money. He will return for an office visit here in 6  weeks.  50 minutes were spent with the patient today. The majority of the time was used for counseling and coordination of care.  Betsy Coder, MD  09/17/2016, 4:15 PM

## 2016-09-18 ENCOUNTER — Telehealth: Payer: Self-pay | Admitting: Oncology

## 2016-09-18 NOTE — Telephone Encounter (Signed)
Needs to sechedule next appointment with Dr Benay Spice.  Was in yesterday and was not able to schedule because there was not a scheduler to help him it was 5pm or after when he was finish with the Dr Please call today because he has family that would like to be in on the treatment so that he can get his next schedule  Call back number [phone]

## 2016-09-18 NOTE — Telephone Encounter (Signed)
sw pt to confirm 4/9 appt per LOS. informed pt that central radiology will contact him to sch CT scans per order. [facility] will call pt to sch appt. Net available with Dr Alinda Money is in May. RN will discuss with MD to see if appt could be made sooner

## 2016-09-20 ENCOUNTER — Telehealth: Payer: Self-pay | Admitting: *Deleted

## 2016-09-20 ENCOUNTER — Encounter: Payer: Self-pay | Admitting: *Deleted

## 2016-09-20 ENCOUNTER — Telehealth: Payer: Self-pay | Admitting: Oncology

## 2016-09-20 ENCOUNTER — Other Ambulatory Visit: Payer: Self-pay | Admitting: *Deleted

## 2016-09-20 DIAGNOSIS — M47816 Spondylosis without myelopathy or radiculopathy, lumbar region: Secondary | ICD-10-CM | POA: Diagnosis not present

## 2016-09-20 DIAGNOSIS — Z79899 Other long term (current) drug therapy: Secondary | ICD-10-CM | POA: Diagnosis not present

## 2016-09-20 DIAGNOSIS — G894 Chronic pain syndrome: Secondary | ICD-10-CM | POA: Diagnosis not present

## 2016-09-20 DIAGNOSIS — M549 Dorsalgia, unspecified: Secondary | ICD-10-CM | POA: Diagnosis not present

## 2016-09-20 DIAGNOSIS — Z79891 Long term (current) use of opiate analgesic: Secondary | ICD-10-CM | POA: Diagnosis not present

## 2016-09-20 NOTE — Telephone Encounter (Signed)
Prior auth complete. Called central scheduling to contact pt for CT scans.

## 2016-09-20 NOTE — Progress Notes (Signed)
Call received from [facility] in radiology to add lab appt prior to CT on 09/24/16.  Message sent to scheduling and Dr Benay Spice notified.

## 2016-09-20 NOTE — Telephone Encounter (Signed)
sw pt to confirm 3/5 lab appt at 1130 per LOS

## 2016-09-24 ENCOUNTER — Other Ambulatory Visit (HOSPITAL_BASED_OUTPATIENT_CLINIC_OR_DEPARTMENT_OTHER): Payer: Medicare Other

## 2016-09-24 ENCOUNTER — Ambulatory Visit (HOSPITAL_COMMUNITY)
Admission: RE | Admit: 2016-09-24 | Discharge: 2016-09-24 | Disposition: A | Discharge status: Home / Self Care | Payer: Medicare Other | Source: Ambulatory Visit | Attending: Oncology | Admitting: Oncology

## 2016-09-24 ENCOUNTER — Encounter (HOSPITAL_COMMUNITY): Payer: Self-pay

## 2016-09-24 DIAGNOSIS — I700 Atherosclerosis of aorta: Secondary | ICD-10-CM | POA: Insufficient documentation

## 2016-09-24 DIAGNOSIS — C632 Malignant neoplasm of scrotum: Secondary | ICD-10-CM

## 2016-09-24 DIAGNOSIS — N281 Cyst of kidney, acquired: Secondary | ICD-10-CM | POA: Insufficient documentation

## 2016-09-24 DIAGNOSIS — I2510 Atherosclerotic heart disease of native coronary artery without angina pectoris: Secondary | ICD-10-CM | POA: Diagnosis not present

## 2016-09-24 DIAGNOSIS — R911 Solitary pulmonary nodule: Secondary | ICD-10-CM | POA: Insufficient documentation

## 2016-09-24 LAB — BASIC METABOLIC PANEL
Anion Gap: 9 mEq/L (ref 3–11)
BUN: 14.5 mg/dL (ref 7.0–26.0)
CALCIUM: 10.3 mg/dL (ref 8.4–10.4)
CO2: 28 meq/L (ref 22–29)
CREATININE: 1 mg/dL (ref 0.7–1.3)
Chloride: 104 mEq/L (ref 98–109)
EGFR: 75 mL/min/{1.73_m2} — ABNORMAL LOW (ref >90)
GLUCOSE: 108 mg/dL (ref 70–140)
Potassium: 4.2 mEq/L (ref 3.5–5.1)
Sodium: 141 mEq/L (ref 136–145)

## 2016-09-24 MED ORDER — IOPAMIDOL (ISOVUE-300) INJECTION 61%
INTRAVENOUS | Status: AC
  Administered 2016-09-24: 100 mL
  Filled 2016-09-24: qty 100

## 2016-09-26 ENCOUNTER — Telehealth: Payer: Self-pay | Admitting: *Deleted

## 2016-09-26 ENCOUNTER — Ambulatory Visit (INDEPENDENT_AMBULATORY_CARE_PROVIDER_SITE_OTHER): Payer: Medicare Other | Admitting: Pharmacist

## 2016-09-26 DIAGNOSIS — Z952 Presence of prosthetic heart valve: Secondary | ICD-10-CM | POA: Diagnosis not present

## 2016-09-26 DIAGNOSIS — I4821 Permanent atrial fibrillation: Secondary | ICD-10-CM

## 2016-09-26 DIAGNOSIS — Z7901 Long term (current) use of anticoagulants: Secondary | ICD-10-CM

## 2016-09-26 DIAGNOSIS — I482 Chronic atrial fibrillation: Secondary | ICD-10-CM

## 2016-09-26 LAB — PROTIME-INR: INR: 2.2 — AB (ref 0.9–1.1)

## 2016-09-26 NOTE — Telephone Encounter (Signed)
-----   Message from Ladell Pier, MD sent at 09/25/2016  6:16 PM EST ----- Please call patient, CTs show no cancer, f/u as scheduled, needs to see Dr. Alinda Money to discuss resection of scrotal nodule

## 2016-09-26 NOTE — Telephone Encounter (Signed)
Call placed to patient to inform him that Dr. Benay Spice would like to see him 1-2 months after his surgery with Dr. Alinda Money and that we can cancel appts currently scheduled for 10/29/16.  Patient states that he has an appt with Dr. Alinda Money next week and will let [facility] know when his surgery is scheduled so that appt with Dr. Benay Spice can be made.  Patient appreciative of call.

## 2016-09-26 NOTE — Telephone Encounter (Signed)
Message received from patient to obtain CT results.  Call placed back to patient and patient notified per order of Dr. Benay Spice that CTs show no cancer, to f/u as scheduled and to see Dr. Alinda Money to discuss resection of scrotal nodule.  Patient states that he would like to cancel his appt on 10/29/16 with Dr. Benay Spice.  Dr. Benay Spice notified.

## 2016-10-02 DIAGNOSIS — C4499 Other specified malignant neoplasm of skin, unspecified: Secondary | ICD-10-CM | POA: Diagnosis not present

## 2016-10-04 DIAGNOSIS — M47816 Spondylosis without myelopathy or radiculopathy, lumbar region: Secondary | ICD-10-CM | POA: Diagnosis not present

## 2016-10-04 DIAGNOSIS — Z79899 Other long term (current) drug therapy: Secondary | ICD-10-CM | POA: Diagnosis not present

## 2016-10-04 DIAGNOSIS — G894 Chronic pain syndrome: Secondary | ICD-10-CM | POA: Diagnosis not present

## 2016-10-04 DIAGNOSIS — M549 Dorsalgia, unspecified: Secondary | ICD-10-CM | POA: Diagnosis not present

## 2016-10-04 DIAGNOSIS — Z79891 Long term (current) use of opiate analgesic: Secondary | ICD-10-CM | POA: Diagnosis not present

## 2016-10-17 ENCOUNTER — Ambulatory Visit (INDEPENDENT_AMBULATORY_CARE_PROVIDER_SITE_OTHER): Payer: Medicare Other | Admitting: Pharmacist

## 2016-10-17 DIAGNOSIS — Z7901 Long term (current) use of anticoagulants: Secondary | ICD-10-CM

## 2016-10-17 DIAGNOSIS — I4821 Permanent atrial fibrillation: Secondary | ICD-10-CM

## 2016-10-17 DIAGNOSIS — I482 Chronic atrial fibrillation: Secondary | ICD-10-CM

## 2016-10-17 LAB — PROTIME-INR: INR: 2.3 — AB (ref 0.9–1.1)

## 2016-10-18 DIAGNOSIS — M549 Dorsalgia, unspecified: Secondary | ICD-10-CM | POA: Diagnosis not present

## 2016-10-18 DIAGNOSIS — M47816 Spondylosis without myelopathy or radiculopathy, lumbar region: Secondary | ICD-10-CM | POA: Diagnosis not present

## 2016-10-18 DIAGNOSIS — G894 Chronic pain syndrome: Secondary | ICD-10-CM | POA: Diagnosis not present

## 2016-10-19 ENCOUNTER — Telehealth: Payer: Self-pay | Admitting: Cardiovascular Disease

## 2016-10-19 DIAGNOSIS — L723 Sebaceous cyst: Secondary | ICD-10-CM | POA: Diagnosis not present

## 2016-10-19 DIAGNOSIS — C44599 Other specified malignant neoplasm of skin of other part of trunk: Secondary | ICD-10-CM | POA: Diagnosis not present

## 2016-10-19 DIAGNOSIS — I10 Essential (primary) hypertension: Secondary | ICD-10-CM | POA: Diagnosis not present

## 2016-10-19 DIAGNOSIS — I4891 Unspecified atrial fibrillation: Secondary | ICD-10-CM | POA: Diagnosis not present

## 2016-10-19 DIAGNOSIS — Z7901 Long term (current) use of anticoagulants: Secondary | ICD-10-CM | POA: Diagnosis not present

## 2016-10-19 NOTE — Telephone Encounter (Signed)
New message      Request for surgical clearance:  1. What type of surgery is being performed? Padet's  2. When is this surgery scheduled?  In 2 weeks  3. Are there any medications that need to be held prior to surgery and how long? Stop coumadin and start lovenox bridge?  4. Name of physician performing surgery? Dr Marla Roe  5. What is your office phone and fax number?  Fax [phone]

## 2016-10-19 NOTE — Telephone Encounter (Signed)
Clearance routed to MD.   

## 2016-10-22 ENCOUNTER — Telehealth: Payer: Self-pay | Admitting: Cardiovascular Disease

## 2016-10-22 NOTE — Telephone Encounter (Signed)
Received records from [facility] for appointment on 10/31/16 with Dr Sallyanne Kuster.  Records put with Dr Croitoru's schedule for 10/31/16. lp

## 2016-10-23 NOTE — Telephone Encounter (Signed)
[place] for surgery; hold warfarin for 5 days.

## 2016-10-23 NOTE — Telephone Encounter (Signed)
CHADS2 score is 2 (for age and hypertension).   Per office protocol patient can hold warfarin x 5 days prior to procedure.  Will not need Lovenox bridging.   Patient can resume warfarin day of or day after procedure at recommendation of MD.

## 2016-10-23 NOTE — Telephone Encounter (Signed)
Clearance faxed via Epic to number provided

## 2016-10-25 ENCOUNTER — Telehealth: Payer: Self-pay | Admitting: Oncology

## 2016-10-25 ENCOUNTER — Ambulatory Visit: Payer: Self-pay | Admitting: Plastic Surgery

## 2016-10-25 DIAGNOSIS — L723 Sebaceous cyst: Secondary | ICD-10-CM

## 2016-10-25 DIAGNOSIS — C4499 Other specified malignant neoplasm of skin, unspecified: Secondary | ICD-10-CM

## 2016-10-25 NOTE — Telephone Encounter (Signed)
Patient called and said "Dr. Benay Spice told him he wanted to see him 2 months after his procedure and wanted to get it schedule for June."  Please call patient with appointment at [phone]

## 2016-10-26 ENCOUNTER — Encounter (HOSPITAL_BASED_OUTPATIENT_CLINIC_OR_DEPARTMENT_OTHER): Payer: Self-pay | Admitting: *Deleted

## 2016-10-26 NOTE — Progress Notes (Signed)
Orders in EPIC to stop coumadin 5d prior to surgery. He will come in for BMET, PT/INR on Tuesday. Pacemake sheet faxed to cards.

## 2016-10-26 NOTE — Telephone Encounter (Signed)
Message sent to scheduling.  Dr. Benay Spice would like to see patient one month after his surgery.

## 2016-10-29 ENCOUNTER — Ambulatory Visit: Payer: Medicare Other | Admitting: Oncology

## 2016-10-29 ENCOUNTER — Other Ambulatory Visit: Payer: Medicare Other

## 2016-10-29 ENCOUNTER — Telehealth: Payer: Self-pay | Admitting: Oncology

## 2016-10-29 NOTE — Telephone Encounter (Signed)
Spoke with patient re f/u 5/18.

## 2016-10-30 ENCOUNTER — Encounter (HOSPITAL_BASED_OUTPATIENT_CLINIC_OR_DEPARTMENT_OTHER)
Admission: RE | Admit: 2016-10-30 | Discharge: 2016-10-30 | Disposition: A | Discharge status: Home / Self Care | Payer: Medicare Other | Source: Ambulatory Visit | Attending: Plastic Surgery | Admitting: Plastic Surgery

## 2016-10-30 DIAGNOSIS — Z950 Presence of cardiac pacemaker: Secondary | ICD-10-CM | POA: Diagnosis not present

## 2016-10-30 DIAGNOSIS — L989 Disorder of the skin and subcutaneous tissue, unspecified: Secondary | ICD-10-CM | POA: Diagnosis present

## 2016-10-30 DIAGNOSIS — L720 Epidermal cyst: Secondary | ICD-10-CM | POA: Diagnosis not present

## 2016-10-30 DIAGNOSIS — Z87891 Personal history of nicotine dependence: Secondary | ICD-10-CM | POA: Diagnosis not present

## 2016-10-30 DIAGNOSIS — Z7901 Long term (current) use of anticoagulants: Secondary | ICD-10-CM | POA: Diagnosis not present

## 2016-10-30 DIAGNOSIS — I482 Chronic atrial fibrillation: Secondary | ICD-10-CM | POA: Diagnosis not present

## 2016-10-30 DIAGNOSIS — G4733 Obstructive sleep apnea (adult) (pediatric): Secondary | ICD-10-CM | POA: Diagnosis not present

## 2016-10-30 DIAGNOSIS — I739 Peripheral vascular disease, unspecified: Secondary | ICD-10-CM | POA: Diagnosis not present

## 2016-10-30 DIAGNOSIS — C44599 Other specified malignant neoplasm of skin of other part of trunk: Secondary | ICD-10-CM | POA: Diagnosis not present

## 2016-10-30 DIAGNOSIS — I10 Essential (primary) hypertension: Secondary | ICD-10-CM | POA: Diagnosis not present

## 2016-10-30 LAB — COMPREHENSIVE METABOLIC PANEL
ALK PHOS: 49 U/L (ref 38–126)
ALT: 14 U/L — AB (ref 17–63)
AST: 24 U/L (ref 15–41)
Albumin: 4.2 g/dL (ref 3.5–5.0)
Anion gap: 10 (ref 5–15)
BUN: 15 mg/dL (ref 6–20)
CALCIUM: 9.4 mg/dL (ref 8.9–10.3)
CO2: 28 mmol/L (ref 22–32)
CREATININE: 1 mg/dL (ref 0.61–1.24)
Chloride: 105 mmol/L (ref 101–111)
GFR calc Af Amer: >60 mL/min (ref >60)
GFR calc non Af Amer: >60 mL/min (ref >60)
GLUCOSE: 104 mg/dL — AB (ref 65–99)
Potassium: 5.1 mmol/L (ref 3.5–5.1)
SODIUM: 143 mmol/L (ref 135–145)
Total Bilirubin: 1 mg/dL (ref 0.3–1.2)
Total Protein: 6.7 g/dL (ref 6.5–8.1)

## 2016-10-30 LAB — PROTIME-INR
INR: 1.49
Prothrombin Time: 18.2 seconds — ABNORMAL HIGH (ref 11.4–15.2)

## 2016-10-30 NOTE — Progress Notes (Signed)
Cardiology Office Note    Date:  10/31/2016   ID:  Cameron Sharp, DOB [birth year], MRN [id number]  PCP:  [name], MD  Cardiologist: Shelva Majestic, M.D.;  Sanda Klein, MD   Chief Complaint  Patient presents with  . Follow-up    pt reports no complaints. he is having surgery tomorrow-removal of skin lesions and sebacceous cyst    History of Present Illness:  Cameron Sharp is a 81 y.o. male with after with slow ventricular response and a single-chamber [employer] Zephyr pacemaker implanted in 2010, as well severe obesity, obstructive sleep apnea, hypertension and hyperlipidemia.   He is still walking with a cane, but his back is better and he has less pain in his left leg. Planning to start playing golf this spring again. He has lost 63 pounds in the last 18 months. He denies angina, dyspnea, palpitations, syncope, bleeding from his, leg edema, focal neurological events, claudication.  He is scheduled to undergo resection of extramammary Paget's disease in his right groin tomorrow with Dr. Audelia Hives. His anticoagulation has been interrupted for this procedure. The plan is for him to restart the warfarin immediately after surgery.  Pacemaker interrogation shows normal device function. He has 53% ventricular pacing and there are no episodes of high ventricular rates. Heart rate histograms are slightly blunted, but his activity level has been less than usual. Estimated generator longevity is 5.75-7.25 years. Lead parameters are normal.   Past Medical History:  Diagnosis Date  . Arthritis    back and neck  . Atrial fibrillation ([place]) 10/10/2012  . Chronic back pain   . HTN (hypertension) 05/01/2013  . Hyperlipidemia   . Hypertension   . OSA on CPAP    wears CPAP nightly  . Pacemaker -[place] 2010 05/01/2013  . Permanent atrial fibrillation [facility])     Past Surgical History:  Procedure Laterality Date  . carpel tunnel surgery Right 2012  .  CHOLECYSTECTOMY    . COLONOSCOPY    . GALLBLADDER SURGERY  12/2006  . NM MYOCAR PERF WALL MOTION  05/22/07   no significant ischemia  . PERMANENT PACEMAKER INSERTION  01/09/2009   [employer]  . TONSILLECTOMY     age 63  . US ECHOCARDIOGRAPHY  11/24/07   mild LVH,LA & RA mod to severely dilated,mild mitral annular ca+,mild TR,mild Pulmonary hypertensio,AOV mod. sclerotic,mild AI w/root dilatation and ca+.    Current Medications: Outpatient Medications Prior to Visit  Medication Sig Dispense Refill  . atorvastatin (LIPITOR) 20 MG tablet Take 1 tablet (20 mg total) by mouth daily. 90 tablet 3  . baclofen (LIORESAL) 10 MG tablet Take 1 tablet by mouth 2 (two) times daily.  0  . COUMADIN 5 MG tablet TAKE 1 TABLET DAILY OR AS DIRECTED 90 tablet 3  . gabapentin (NEURONTIN) 300 MG capsule Take 900 mg by mouth daily.     Marland Kitchen HYDROcodone-acetaminophen (NORCO) 10-325 MG per tablet Take 1 tablet by mouth every 6 (six) hours as needed for pain.    . metoprolol tartrate (LOPRESSOR) 25 MG tablet TAKE ONE-HALF (12.5 MG) TABLET TWICE A DAY 90 tablet 3  . Thiamine HCl (VITAMIN B-1) 250 MG tablet Take 250 mg by mouth daily.    Marland Kitchen ULORIC 40 MG tablet Take 1 tablet (40 mg total) by mouth daily. 30 tablet 6  . valsartan-hydrochlorothiazide (DIOVAN-HCT) 160-12.5 MG tablet Take 1 tablet by mouth daily. 90 tablet 3   No facility-administered medications prior to visit.  Allergies:   Patient has no known allergies.   Social History   Social History  . Marital status: Married    Spouse name: N/A  . Number of children: N/A  . Years of education: N/A   Social History Main Topics  . Smoking status: Former Smoker    Quit date: 07/23/1976  . Smokeless tobacco: Never Used  . Alcohol use 0.0 oz/week     Comment: 2oz scotch daily  . Drug use: No  . Sexual activity: Not Asked   Other Topics Concern  . None   Social History Narrative  . None     Family History:  The patient's family history includes Cancer  in his father.   ROS:   Please see the history of present illness.    ROS All other systems reviewed and are negative.   PHYSICAL EXAM:   VS:  BP 118/64   Pulse 60   Ht 6\' 3"  (1.905 m)   Wt 123.1 kg (271 lb 6.4 oz)   BMI 33.92 kg/m    GEN: Well nourished, well developed, in no acute distress  HEENT: normal  Neck: no JVD, carotid bruits, or masses Cardiac: irregular; no murmurs, rubs, or gallops,no edema , LC pacemaker site left subclavian area Respiratory:  clear to auscultation bilaterally, normal work of breathing GI: soft, nontender, nondistended, + BS MS: no deformity or atrophy  Skin: warm and dry, no rash Neuro:  Alert and Oriented x 3, Strength and sensation are intact Psych: euthymic mood, full affect  Wt Readings from Last 3 Encounters:  10/31/16 123.1 kg (271 lb 6.4 oz)  09/17/16 126.9 kg (279 lb 12.8 oz)  05/01/16 (!) 142.2 kg (313 lb 6.4 oz)      Studies/Labs Reviewed:   EKG:  EKG is not ordered today.   Recent Labs: 10/30/2016: ALT 14; BUN 15; Creatinine, Ser 1.00; Potassium 5.1; Sodium 143   Lipid Panel    Component Value Date/Time   CHOL 173 10/25/2014 0838   TRIG 129 10/25/2014 0838   HDL 65 10/25/2014 0838   CHOLHDL 2.7 10/25/2014 0838   VLDL 26 10/25/2014 0838   LDLCALC 82 10/25/2014 0838    ASSESSMENT:    1. Permanent atrial fibrillation ([place])   2. Pacemaker -[place] 2010   3. Essential hypertension   4. Pure hypercholesterolemia   5. OSA on CPAP   6. Obesity, Class II, BMI 35-39.9, with comorbidity      PLAN:  In order of problems listed above:  1. Afib: Slow ventricular response. Adequate sensor settings by histogram. On appropriate anticoagulation without bleeding complications. He is not pacemaker dependent. The planned surgery should not interfere with normal pacemaker function. 2. PM: Normal pacemaker function. No reprogramming changes necessary. Unfortunately his device is not amenable to remote  monitoring. Follow-up in office pacemaker checks every 6 months 3. HTN: Excellent control 4. HLP: Satisfactory lipid profile parameters 5. OSA: Reports compliance with CPAP 6. Obesity: improved to mildly obese range. He has made exceptional progress with weight loss and is to be congratulated..    Medication Adjustments/Labs and Tests Ordered: Current medicines are reviewed at length with the patient today.  Concerns regarding medicines are outlined above.  Medication changes, Labs and Tests ordered today are listed in the Patient Instructions below. Patient Instructions  Dr Sallyanne Kuster recommends that you continue on your current medications as directed. Please refer to the Current Medication list given to you today.  Your physician recommends that you schedule  a follow-up appointment in 6 months with a device check. You will receive a reminder letter in the mail two months in advance. If you don't receive a letter, please call our office to schedule the follow-up appointment.  If you need a refill on your cardiac medications before your next appointment, please call your pharmacy.    Signed, Sanda Klein, MD  10/31/2016 11:13 AM    [facility] [address], [place], [place]  [address] Phone: [phone]; Fax: [phone]

## 2016-10-31 ENCOUNTER — Ambulatory Visit (INDEPENDENT_AMBULATORY_CARE_PROVIDER_SITE_OTHER): Payer: Medicare Other | Admitting: Cardiovascular Disease

## 2016-10-31 ENCOUNTER — Encounter: Payer: Self-pay | Admitting: Cardiovascular Disease

## 2016-10-31 VITALS — BP 118/64 | HR 60 | Ht 75.0 in | Wt 271.4 lb

## 2016-10-31 DIAGNOSIS — E7800 Pure hypercholesterolemia, unspecified: Secondary | ICD-10-CM | POA: Diagnosis not present

## 2016-10-31 DIAGNOSIS — I482 Chronic atrial fibrillation: Secondary | ICD-10-CM | POA: Diagnosis not present

## 2016-10-31 DIAGNOSIS — Z950 Presence of cardiac pacemaker: Secondary | ICD-10-CM | POA: Diagnosis not present

## 2016-10-31 DIAGNOSIS — Z9989 Dependence on other enabling machines and devices: Secondary | ICD-10-CM | POA: Diagnosis not present

## 2016-10-31 DIAGNOSIS — IMO0001 Reserved for inherently not codable concepts without codable children: Secondary | ICD-10-CM

## 2016-10-31 DIAGNOSIS — E669 Obesity, unspecified: Secondary | ICD-10-CM | POA: Diagnosis not present

## 2016-10-31 DIAGNOSIS — I10 Essential (primary) hypertension: Secondary | ICD-10-CM | POA: Diagnosis not present

## 2016-10-31 DIAGNOSIS — G4733 Obstructive sleep apnea (adult) (pediatric): Secondary | ICD-10-CM

## 2016-10-31 DIAGNOSIS — I4821 Permanent atrial fibrillation: Secondary | ICD-10-CM

## 2016-10-31 LAB — CUP PACEART INCLINIC DEVICE CHECK
Brady Statistic RV Percent Paced: 53 %
Implantable Lead Implant Date: 20100720
Implantable Lead Model: 1948
Implantable Pulse Generator Implant Date: 20100720
Lead Channel Pacing Threshold Amplitude: 0.5 V
Lead Channel Setting Pacing Amplitude: 2.5 V
Lead Channel Setting Pacing Pulse Width: 0.4 ms
MDC IDC LEAD LOCATION: 753860
MDC IDC MSMT BATTERY IMPEDANCE: 1000 Ohm
MDC IDC MSMT BATTERY VOLTAGE: 2.79 V
MDC IDC MSMT LEADCHNL RV IMPEDANCE VALUE: 591 Ohm
MDC IDC MSMT LEADCHNL RV PACING THRESHOLD PULSEWIDTH: 0.2 ms
MDC IDC MSMT LEADCHNL RV SENSING INTR AMPL: 12 mV
MDC IDC SESS DTM: 20180411133715
MDC IDC SET LEADCHNL RV SENSING SENSITIVITY: 2 mV
Pulse Gen Serial Number: 7043529

## 2016-10-31 NOTE — Patient Instructions (Signed)
Dr Sallyanne Kuster recommends that you continue on your current medications as directed. Please refer to the Current Medication list given to you today.  Your physician recommends that you schedule a follow-up appointment in 6 months with a device check. You will receive a reminder letter in the mail two months in advance. If you don't receive a letter, please call our office to schedule the follow-up appointment.  If you need a refill on your cardiac medications before your next appointment, please call your pharmacy.

## 2016-11-01 ENCOUNTER — Encounter (HOSPITAL_BASED_OUTPATIENT_CLINIC_OR_DEPARTMENT_OTHER): Payer: Self-pay | Admitting: *Deleted

## 2016-11-01 ENCOUNTER — Ambulatory Visit (HOSPITAL_BASED_OUTPATIENT_CLINIC_OR_DEPARTMENT_OTHER): Payer: Medicare Other | Admitting: Anesthesiology

## 2016-11-01 ENCOUNTER — Ambulatory Visit (HOSPITAL_BASED_OUTPATIENT_CLINIC_OR_DEPARTMENT_OTHER)
Admission: RE | Admit: 2016-11-01 | Discharge: 2016-11-01 | Disposition: A | Discharge status: Home / Self Care | Payer: Medicare Other | Source: Ambulatory Visit | Attending: Plastic Surgery | Admitting: Plastic Surgery

## 2016-11-01 ENCOUNTER — Encounter (HOSPITAL_BASED_OUTPATIENT_CLINIC_OR_DEPARTMENT_OTHER)
Admission: RE | Disposition: A | Discharge status: Home / Self Care | Payer: Self-pay | Source: Ambulatory Visit | Attending: Plastic Surgery

## 2016-11-01 DIAGNOSIS — C44509 Unspecified malignant neoplasm of skin of other part of trunk: Secondary | ICD-10-CM | POA: Diagnosis not present

## 2016-11-01 DIAGNOSIS — E785 Hyperlipidemia, unspecified: Secondary | ICD-10-CM | POA: Diagnosis not present

## 2016-11-01 DIAGNOSIS — I10 Essential (primary) hypertension: Secondary | ICD-10-CM | POA: Insufficient documentation

## 2016-11-01 DIAGNOSIS — C4499 Other specified malignant neoplasm of skin, unspecified: Secondary | ICD-10-CM

## 2016-11-01 DIAGNOSIS — L720 Epidermal cyst: Secondary | ICD-10-CM | POA: Insufficient documentation

## 2016-11-01 DIAGNOSIS — Z7901 Long term (current) use of anticoagulants: Secondary | ICD-10-CM | POA: Insufficient documentation

## 2016-11-01 DIAGNOSIS — I482 Chronic atrial fibrillation: Secondary | ICD-10-CM | POA: Diagnosis not present

## 2016-11-01 DIAGNOSIS — C44599 Other specified malignant neoplasm of skin of other part of trunk: Secondary | ICD-10-CM | POA: Diagnosis not present

## 2016-11-01 DIAGNOSIS — Z950 Presence of cardiac pacemaker: Secondary | ICD-10-CM | POA: Insufficient documentation

## 2016-11-01 DIAGNOSIS — I739 Peripheral vascular disease, unspecified: Secondary | ICD-10-CM | POA: Insufficient documentation

## 2016-11-01 DIAGNOSIS — Z87891 Personal history of nicotine dependence: Secondary | ICD-10-CM | POA: Diagnosis not present

## 2016-11-01 DIAGNOSIS — G4733 Obstructive sleep apnea (adult) (pediatric): Secondary | ICD-10-CM | POA: Insufficient documentation

## 2016-11-01 DIAGNOSIS — L723 Sebaceous cyst: Secondary | ICD-10-CM

## 2016-11-01 HISTORY — DX: Unspecified osteoarthritis, unspecified site: M19.90

## 2016-11-01 HISTORY — PX: MASS EXCISION: SHX2000

## 2016-11-01 HISTORY — DX: Other chronic pain: G89.29

## 2016-11-01 HISTORY — PX: LIPOMA EXCISION: SHX5283

## 2016-11-01 HISTORY — DX: Dorsalgia, unspecified: M54.9

## 2016-11-01 SURGERY — EXCISION MASS
Anesthesia: General

## 2016-11-01 MED ORDER — SODIUM CHLORIDE 0.9 % IV SOLN: Freq: Once | INTRAVENOUS | Status: DC

## 2016-11-01 MED ORDER — PROPOFOL 10 MG/ML IV BOLUS
INTRAVENOUS | Status: DC | PRN
  Administered 2016-11-01: 200 mg via INTRAVENOUS
  Administered 2016-11-01 (×2): 20 mg via INTRAVENOUS

## 2016-11-01 MED ORDER — LIDOCAINE-EPINEPHRINE (PF) 1 %-1:200000 IJ SOLN
INTRAMUSCULAR | Status: AC
  Filled 2016-11-01: qty 60

## 2016-11-01 MED ORDER — CEFAZOLIN SODIUM-DEXTROSE 2-4 GM/100ML-% IV SOLN
INTRAVENOUS | Status: AC
  Filled 2016-11-01: qty 100

## 2016-11-01 MED ORDER — HYDROMORPHONE HCL 1 MG/ML IJ SOLN: 0.2500 mg | INTRAMUSCULAR | Status: DC | PRN

## 2016-11-01 MED ORDER — CEFAZOLIN IN D5W 1 GM/50ML IV SOLN
INTRAVENOUS | Status: AC
  Filled 2016-11-01: qty 50

## 2016-11-01 MED ORDER — MIDAZOLAM HCL 2 MG/2ML IJ SOLN: 1.0000 mg | INTRAMUSCULAR | Status: DC | PRN

## 2016-11-01 MED ORDER — SCOPOLAMINE 1 MG/3DAYS TD PT72: 1.0000 | MEDICATED_PATCH | Freq: Once | TRANSDERMAL | Status: DC | PRN

## 2016-11-01 MED ORDER — PROPOFOL 10 MG/ML IV BOLUS
INTRAVENOUS | Status: AC
  Filled 2016-11-01: qty 20

## 2016-11-01 MED ORDER — FENTANYL CITRATE (PF) 100 MCG/2ML IJ SOLN
50.0000 ug | INTRAMUSCULAR | Status: DC | PRN
  Administered 2016-11-01 (×2): 25 ug via INTRAVENOUS

## 2016-11-01 MED ORDER — FENTANYL CITRATE (PF) 100 MCG/2ML IJ SOLN
INTRAMUSCULAR | Status: AC
  Filled 2016-11-01: qty 2

## 2016-11-01 MED ORDER — CEFAZOLIN SODIUM-DEXTROSE 2-4 GM/100ML-% IV SOLN
2.0000 g | INTRAVENOUS | Status: AC
  Administered 2016-11-01: 3 g via INTRAVENOUS

## 2016-11-01 MED ORDER — LIDOCAINE-EPINEPHRINE 0.5 %-1:200000 IJ SOLN
INTRAMUSCULAR | Status: AC
  Filled 2016-11-01: qty 1

## 2016-11-01 MED ORDER — PROMETHAZINE HCL 25 MG/ML IJ SOLN: 6.2500 mg | INTRAMUSCULAR | Status: DC | PRN

## 2016-11-01 MED ORDER — LIDOCAINE-EPINEPHRINE 1 %-1:100000 IJ SOLN
INTRAMUSCULAR | Status: DC | PRN
  Administered 2016-11-01: 30 mL

## 2016-11-01 MED ORDER — LACTATED RINGERS IV SOLN
INTRAVENOUS | Status: DC
  Administered 2016-11-01: 08:00:00 via INTRAVENOUS

## 2016-11-01 SURGICAL SUPPLY — 96 items
BAG DECANTER FOR FLEXI CONT (MISCELLANEOUS) ×3 IMPLANT
BANDAGE ACE 3X5.8 VEL STRL LF (GAUZE/BANDAGES/DRESSINGS) IMPLANT
BANDAGE ACE 4X5 VEL STRL LF (GAUZE/BANDAGES/DRESSINGS) IMPLANT
BANDAGE ACE 6X5 VEL STRL LF (GAUZE/BANDAGES/DRESSINGS) IMPLANT
BENZOIN TINCTURE PRP APPL 2/3 (GAUZE/BANDAGES/DRESSINGS) IMPLANT
BLADE CLIPPER SURG (BLADE) IMPLANT
BLADE HEX COATED 2.75 (ELECTRODE) IMPLANT
BLADE SURG 10 STRL SS (BLADE) IMPLANT
BLADE SURG 15 STRL LF DISP TIS (BLADE) ×1 IMPLANT
BLADE SURG 15 STRL SS (BLADE) ×2
BNDG COHESIVE 4X5 TAN STRL (GAUZE/BANDAGES/DRESSINGS) IMPLANT
BNDG CONFORM 2 STRL LF (GAUZE/BANDAGES/DRESSINGS) IMPLANT
BNDG ELASTIC 2X5.8 VLCR STR LF (GAUZE/BANDAGES/DRESSINGS) IMPLANT
BNDG GAUZE ELAST 4 BULKY (GAUZE/BANDAGES/DRESSINGS) ×3 IMPLANT
CANISTER SUCT 1200ML W/VALVE (MISCELLANEOUS) IMPLANT
CHLORAPREP W/TINT 26ML (MISCELLANEOUS) IMPLANT
CLEANER CAUTERY TIP 5X5 PAD (MISCELLANEOUS) IMPLANT
CLOSURE WOUND 1/2 X4 (GAUZE/BANDAGES/DRESSINGS)
CORDS BIPOLAR (ELECTRODE) IMPLANT
COVER BACK TABLE 60X90IN (DRAPES) ×3 IMPLANT
COVER MAYO STAND STRL (DRAPES) ×3 IMPLANT
DECANTER SPIKE VIAL GLASS SM (MISCELLANEOUS) IMPLANT
DERMABOND ADVANCED (GAUZE/BANDAGES/DRESSINGS) ×4
DERMABOND ADVANCED .7 DNX12 (GAUZE/BANDAGES/DRESSINGS) ×2 IMPLANT
DRAPE INCISE IOBAN 66X45 STRL (DRAPES) IMPLANT
DRAPE LAPAROTOMY 100X72 PEDS (DRAPES) IMPLANT
DRAPE U-SHAPE 76X120 STRL (DRAPES) ×6 IMPLANT
DRESSING DUODERM 4X4 STERILE (GAUZE/BANDAGES/DRESSINGS) IMPLANT
DRSG ADAPTIC 3X8 NADH LF (GAUZE/BANDAGES/DRESSINGS) IMPLANT
DRSG EMULSION OIL 3X3 NADH (GAUZE/BANDAGES/DRESSINGS) IMPLANT
DRSG PAD ABDOMINAL 8X10 ST (GAUZE/BANDAGES/DRESSINGS) ×3 IMPLANT
DRSG TEGADERM 2-3/8X2-3/4 SM (GAUZE/BANDAGES/DRESSINGS) ×3 IMPLANT
DRSG TEGADERM 4X4.75 (GAUZE/BANDAGES/DRESSINGS) IMPLANT
ELECT COATED BLADE 2.86 ST (ELECTRODE) IMPLANT
ELECT NEEDLE BLADE 2-5/6 (NEEDLE) IMPLANT
ELECT REM PT RETURN 9FT ADLT (ELECTROSURGICAL) ×3
ELECT REM PT RETURN 9FT PED (ELECTROSURGICAL)
ELECTRODE REM PT RETRN 9FT PED (ELECTROSURGICAL) IMPLANT
ELECTRODE REM PT RTRN 9FT ADLT (ELECTROSURGICAL) ×1 IMPLANT
GAUZE SPONGE 4X4 12PLY STRL (GAUZE/BANDAGES/DRESSINGS) ×3 IMPLANT
GAUZE SPONGE 4X4 12PLY STRL LF (GAUZE/BANDAGES/DRESSINGS) IMPLANT
GLOVE BIO SURGEON STRL SZ 6.5 (GLOVE) ×4 IMPLANT
GLOVE BIO SURGEONS STRL SZ 6.5 (GLOVE) ×2
GOWN STRL REUS W/ TWL LRG LVL3 (GOWN DISPOSABLE) ×2 IMPLANT
GOWN STRL REUS W/TWL LRG LVL3 (GOWN DISPOSABLE) ×4
NEEDLE HYPO 25X1 1.5 SAFETY (NEEDLE) IMPLANT
NEEDLE HYPO 30GX1 BEV (NEEDLE) IMPLANT
NEEDLE PRECISIONGLIDE 27X1.5 (NEEDLE) ×3 IMPLANT
NS IRRIG 1000ML POUR BTL (IV SOLUTION) ×3 IMPLANT
PACK BASIN DAY SURGERY FS (CUSTOM PROCEDURE TRAY) ×3 IMPLANT
PAD CLEANER CAUTERY TIP 5X5 (MISCELLANEOUS)
PADDING CAST ABS 3INX4YD NS (CAST SUPPLIES)
PADDING CAST ABS 4INX4YD NS (CAST SUPPLIES)
PADDING CAST ABS COTTON 3X4 (CAST SUPPLIES) IMPLANT
PADDING CAST ABS COTTON 4X4 ST (CAST SUPPLIES) IMPLANT
PENCIL BUTTON HOLSTER BLD 10FT (ELECTRODE) IMPLANT
RUBBERBAND STERILE (MISCELLANEOUS) IMPLANT
SHEET MEDIUM DRAPE 40X70 STRL (DRAPES) ×3 IMPLANT
SLEEVE SCD COMPRESS KNEE MED (MISCELLANEOUS) IMPLANT
SPLINT FIBERGLASS 3X35 (CAST SUPPLIES) ×3 IMPLANT
SPLINT FIBERGLASS 4X30 (CAST SUPPLIES) IMPLANT
SPONGE GAUZE 2X2 8PLY STER LF (GAUZE/BANDAGES/DRESSINGS) ×1
SPONGE GAUZE 2X2 8PLY STRL LF (GAUZE/BANDAGES/DRESSINGS) ×2 IMPLANT
SPONGE LAP 18X18 X RAY DECT (DISPOSABLE) ×3 IMPLANT
STAPLER VISISTAT 35W (STAPLE) IMPLANT
STOCKINETTE IMPERVIOUS LG (DRAPES) IMPLANT
STRIP CLOSURE SKIN 1/2X4 (GAUZE/BANDAGES/DRESSINGS) IMPLANT
SUCTION FRAZIER HANDLE 10FR (MISCELLANEOUS)
SUCTION TUBE FRAZIER 10FR DISP (MISCELLANEOUS) IMPLANT
SURGILUBE 2OZ TUBE FLIPTOP (MISCELLANEOUS) IMPLANT
SUT ETHILON 4 0 PS 2 18 (SUTURE) IMPLANT
SUT MNCRL 6-0 UNDY P1 1X18 (SUTURE) IMPLANT
SUT MNCRL AB 3-0 PS2 18 (SUTURE) IMPLANT
SUT MNCRL AB 4-0 PS2 18 (SUTURE) ×9 IMPLANT
SUT MON AB 3-0 SH 27 (SUTURE)
SUT MON AB 3-0 SH27 (SUTURE) IMPLANT
SUT MON AB 5-0 P3 18 (SUTURE) IMPLANT
SUT MON AB 5-0 PS2 18 (SUTURE) ×15 IMPLANT
SUT MONOCRYL 6-0 P1 1X18 (SUTURE)
SUT PROLENE 5 0 P 3 (SUTURE) IMPLANT
SUT PROLENE 5 0 PS 2 (SUTURE) IMPLANT
SUT PROLENE 6 0 P 1 18 (SUTURE) IMPLANT
SUT SILK 3 0 PS 1 (SUTURE) IMPLANT
SUT VIC AB 5-0 P-3 18X BRD (SUTURE) IMPLANT
SUT VIC AB 5-0 P3 18 (SUTURE)
SUT VIC AB 5-0 PS2 18 (SUTURE) ×3 IMPLANT
SUT VICRYL 4-0 PS2 18IN ABS (SUTURE) IMPLANT
SYR BULB 3OZ (MISCELLANEOUS) IMPLANT
SYR BULB IRRIGATION 50ML (SYRINGE) ×3 IMPLANT
SYR CONTROL 10ML LL (SYRINGE) ×3 IMPLANT
TOWEL OR 17X24 6PK STRL BLUE (TOWEL DISPOSABLE) ×3 IMPLANT
TRAY DSU PREP LF (CUSTOM PROCEDURE TRAY) ×3 IMPLANT
TUBE CONNECTING 20'X1/4 (TUBING) ×1
TUBE CONNECTING 20X1/4 (TUBING) ×2 IMPLANT
UNDERPAD 30X30 (UNDERPADS AND DIAPERS) ×3 IMPLANT
YANKAUER SUCT BULB TIP NO VENT (SUCTIONS) ×3 IMPLANT

## 2016-11-01 NOTE — Op Note (Signed)
DATE OF OPERATION: 11/01/2016  LOCATION: [facility]  PREOPERATIVE DIAGNOSIS: right groin skin extramammary paget's lesion, left posterior shoulder sebaceous cyst  POSTOPERATIVE DIAGNOSIS: Same  PROCEDURE: Excision of left shoulder sebaceous cyst 3 cm with primary closure.  Excision of right groin extramammary paget's disease 5 x 7 cm with complex closure  SURGEON: Claire Sanger Dillingham, DO  ASSISTANT: Shawn Rayburn, PA  EBL: 10 cc  CONDITION: Stable  COMPLICATIONS: None  INDICATION: The patient, Cameron Sharp, is a 81 y.o. male born on May 22, 1936, is here for treatment left posterior shoulder sebaceous cyst that has been getting larger over the past several months.  He was also diagnosed with right groin extramammary paget's disease with a lesion in the groin.Marland Kitchen   PROCEDURE DETAILS:  The patient was seen prior to surgery and marked.  The IV antibiotics were given. The patient was taken to the operating room and given a general anesthetic. A standard time out was performed and all information was confirmed by those in the room. SCDs were placed.   The patient was placed in the right lateral position.  He was prepped and draped.  The local was injected in the back around the cyst.   The #15 blade was used to make an incision in an elliptical patter around the opening of the cyst.  The scissors were used to dissect around the capsule so the entire lesion could be removed.  The area was irrigated and the skin closed with 4-0 Monocryl vertical mattress sutures.  A sterile dressing was applied.  The patient was placed on his back.  The groin was then prepped and draped.  The local was injected.  The area was marked and the #10 blade used to make the incision around the lesion to remove the 5 x 7 cm lesion deep to the dermis.  Undermining was done for 2 cm to decrease the tension for closure.  The deep layers were brought together in a T shape with the 3-0 and 4-0 Monocryl.  The  5-0 Monocryl was used to close the skin layer with a combination of running sutures and vertical mattess sutures.  Dermabond was applied.  The patient was allowed to wake up and taken to recovery room in stable condition at the end of the case. The family was notified at the end of the case.  The short stitch was placed superior, long stitch lateral and the double stitch medial.

## 2016-11-01 NOTE — Interval H&P Note (Signed)
History and Physical Interval Note:  11/01/2016 9:55 AM  Cameron Sharp  has presented today for surgery, with the diagnosis of RIGHT GROIN SKIN LESION  The various methods of treatment have been discussed with the patient and family. After consideration of risks, benefits and other options for treatment, the patient has consented to  Procedure(s): EXCISION OF RIGHT GROIN SKIN LESION (N/A) POSSIBLE APPLICATION OF A-CELL ON GROIN (N/A) EXCISION OF SEBACEOUS CYST ON BACK (N/A) as a surgical intervention .  The patient's history has been reviewed, patient examined, no change in status, stable for surgery.  I have reviewed the patient's chart and labs.  Questions were answered to the patient's satisfaction.     [name]

## 2016-11-01 NOTE — H&P (Signed)
Cameron Sharp is an 81 y.o. male.   Chief Complaint: Paget's disease HPI: The patient is a 81 y.o. 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  He was referred to Dr. Alinda Money by Dr. Julieanne Manson by Dr. Ubaldo Glassing.  He noted a redness in the right groin and was being treatment for a rash. 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  There was concern about the location and getting the area closed after excision.  The area is ~ 4 x 5 cm in size on the right groin / inguinal area.  The scrotum is not involved grossly.  He has a pacemaker and is on coumadin.  He does his own INR testing.  He is scheduled for a pacemaker review 10/31/16.  He also has a 3 cm cyst on his back that he would like to have removed.  It has been present for years but he has not wanted to get it removed while on the coumadin.  Nothing makes it better and it seems to be getting bigger with time.       Past Medical History:  Diagnosis Date  . Arthritis    back and neck  . Atrial fibrillation ([place]) 10/10/2012  . Chronic back pain   . HTN (hypertension) 05/01/2013  . Hyperlipidemia   . Hypertension   . OSA on CPAP    wears CPAP nightly  . Pacemaker -[place] 2010 05/01/2013  . Permanent atrial fibrillation [facility])     Past Surgical History:  Procedure Laterality Date  . carpel tunnel surgery Right 2012  . CHOLECYSTECTOMY    . COLONOSCOPY    . GALLBLADDER SURGERY  12/2006  . NM MYOCAR PERF WALL MOTION  05/22/07   no significant ischemia  . PERMANENT PACEMAKER INSERTION  01/09/2009   [employer]  . TONSILLECTOMY     age 15  . US ECHOCARDIOGRAPHY  11/24/07   mild LVH,LA & RA mod to severely dilated,mild mitral annular ca+,mild TR,mild Pulmonary hypertensio,AOV mod. sclerotic,mild AI w/root dilatation and ca+.    Family History  Problem Relation Age of Onset  . Cancer Father    Social History:  reports that he quit smoking about 40 years ago. He has never used smokeless tobacco. He reports  that he drinks alcohol. He reports that he does not use drugs.  Allergies: No Known Allergies  No prescriptions prior to admission.    Results for orders placed or performed during the hospital encounter of 11/01/16 (from the past 48 hour(s))  Comprehensive metabolic panel     Status: Abnormal   Collection Time: 10/30/16  7:30 AM  Result Value Ref Range   Sodium 143 135 - 145 mmol/L   Potassium 5.1 3.5 - 5.1 mmol/L   Chloride 105 101 - 111 mmol/L   CO2 28 22 - 32 mmol/L   Glucose, Bld 104 (H) 65 - 99 mg/dL   BUN 15 6 - 20 mg/dL   Creatinine, Ser 1.00 0.61 - 1.24 mg/dL   Calcium 9.4 8.9 - 10.3 mg/dL   Total Protein 6.7 6.5 - 8.1 g/dL   Albumin 4.2 3.5 - 5.0 g/dL   AST 24 15 - 41 U/L   ALT 14 (L) 17 - 63 U/L   Alkaline Phosphatase 49 38 - 126 U/L   Total Bilirubin 1.0 0.3 - 1.2 mg/dL   GFR calc non Af Amer >60 >60 mL/min   GFR calc Af Amer >60 >60 mL/min    Comment: (NOTE) The eGFR  has been calculated using the CKD EPI equation. This calculation has not been validated in all clinical situations. eGFR's persistently <60 mL/min signify possible Chronic Kidney Disease.    Anion gap 10 5 - 15  PT- INR at PAT visit (Pre-admission Testing)     Status: Abnormal   Collection Time: 10/30/16  7:30 AM  Result Value Ref Range   Prothrombin Time 18.2 (H) 11.4 - 15.2 seconds   INR 1.49    No results found.  Review of Systems  Constitutional: Negative.   HENT: Negative.   Eyes: Negative.   Respiratory: Negative.   Cardiovascular: Negative.   Gastrointestinal: Negative.   Genitourinary: Negative.   Musculoskeletal: Negative.   Skin: Negative.   Neurological: Negative.   Psychiatric/Behavioral: Negative.     Height _0  (1.905 m), weight 126.6 kg (279 lb). Physical Exam  Constitutional: He appears well-developed and well-nourished.  HENT:  Head: Normocephalic and atraumatic.  Eyes: EOM are normal. Pupils are equal, round, and reactive to light.  Cardiovascular: Normal  rate.   Respiratory: Effort normal.  GI: Soft. He exhibits no distension. There is no tenderness.  Musculoskeletal: He exhibits no edema.  Neurological: He is alert.  Skin: Skin is warm. No rash noted. No erythema. No pallor.  Psychiatric: He has a normal mood and affect. His behavior is normal. Judgment and thought content normal.     Assessment/Plan Excision of groin paget's disease lesion with possible closure and acell.  [name], DO 11/01/2016, 7:25 AM

## 2016-11-01 NOTE — Transfer of Care (Signed)
Immediate Anesthesia Transfer of Care Note  Patient: Cameron Sharp  Procedure(s) Performed: Procedure(s): EXCISION OF RIGHT GROIN SKIN LESION (N/A) EXCISION OF SEBACEOUS CYST ON BACK (N/A)  Patient Location: PACU  Anesthesia Type:General  Level of Consciousness: awake, alert  and oriented  Airway & Oxygen Therapy: Patient Spontanous Breathing and Patient connected to face mask oxygen  Post-op Assessment: Report given to RN and Post -op Vital signs reviewed and stable  Post vital signs: Reviewed and stable  Last Vitals:  Vitals:   11/01/16 1155 11/01/16 1156  BP: 130/64   Pulse: 70 (!) 42  Resp: (!) 38 (!) 9  Temp:      Last Pain:  Vitals:   11/01/16 0816  TempSrc: Oral  PainSc: 6       Patients Stated Pain Goal: 4 (22/29/79 8921)  Complications: No apparent anesthesia complications

## 2016-11-01 NOTE — Discharge Instructions (Addendum)
May shower tomorrow. Remove the shoulder dressing on Monday.   No pain meds from [place] (He has at Home)   [place] Instructions  Activity: Get plenty of rest for the remainder of the day. A responsible individual must stay with you for 24 hours following the procedure.  For the next 24 hours, DO NOT: -Drive a car -Paediatric nurse -Drink alcoholic beverages -Take any medication unless instructed by your physician -Make any legal decisions or sign important papers.  Meals: Start with liquid foods such as gelatin or soup. Progress to regular foods as tolerated. Avoid greasy, spicy, heavy foods. If nausea and/or vomiting occur, drink only clear liquids until the nausea and/or vomiting subsides. Call your physician if vomiting continues.  Special Instructions/Symptoms: Your throat may feel dry or sore from the anesthesia or the breathing tube placed in your throat during surgery. If this causes discomfort, gargle with warm salt water. The discomfort should disappear within 24 hours.  If you had a scopolamine patch placed behind your ear for the management of post- operative nausea and/or vomiting:  1. The medication in the patch is effective for 72 hours, after which it should be removed.  Wrap patch in a tissue and discard in the trash. Wash hands thoroughly with soap and water. 2. You may remove the patch earlier than 72 hours if you experience unpleasant side effects which may include dry mouth, dizziness or visual disturbances. 3. Avoid touching the patch. Wash your hands with soap and water after contact with the patch.

## 2016-11-01 NOTE — Anesthesia Preprocedure Evaluation (Signed)
Anesthesia Evaluation  Patient identified by MRN, date of birth, ID band Patient awake    Reviewed: Allergy & Precautions, NPO status , Patient's Chart, lab work & pertinent test results  Airway Mallampati: II  TM Distance: >3 FB Neck ROM: Full    Dental no notable dental hx.    Pulmonary sleep apnea , former smoker,    breath sounds clear to auscultation       Cardiovascular hypertension, + Peripheral Vascular Disease  + dysrhythmias Atrial Fibrillation + pacemaker  Rhythm:Irregular Rate:Normal     Neuro/Psych    GI/Hepatic negative GI ROS, Neg liver ROS,   Endo/Other  negative endocrine ROS  Renal/GU negative Renal ROS     Musculoskeletal  (+) Arthritis ,   Abdominal   Peds  Hematology   Anesthesia Other Findings   Reproductive/Obstetrics                             Anesthesia Physical Anesthesia Plan  ASA: III  Anesthesia Plan: General   Post-op Pain Management:    Induction: Intravenous  Airway Management Planned: LMA  Additional Equipment:   Intra-op Plan:   Post-operative Plan: Extubation in OR  Informed Consent: I have reviewed the patients History and Physical, chart, labs and discussed the procedure including the risks, benefits and alternatives for the proposed anesthesia with the patient or authorized representative who has indicated his/her understanding and acceptance.   Dental advisory given  Plan Discussed with: CRNA  Anesthesia Plan Comments:         Anesthesia Quick Evaluation

## 2016-11-01 NOTE — Anesthesia Procedure Notes (Signed)
Procedure Name: LMA Insertion Date/Time: 11/01/2016 10:55 AM Performed by: Melynda Ripple D Pre-anesthesia Checklist: Patient identified, Emergency Drugs available, Suction available and Patient being monitored Patient Re-evaluated:Patient Re-evaluated prior to inductionOxygen Delivery Method: Circle system utilized Preoxygenation: Pre-oxygenation with 100% oxygen Intubation Type: IV induction Ventilation: Mask ventilation without difficulty LMA: LMA inserted LMA Size: 5.0 Number of attempts: 1 Airway Equipment and Method: Bite block Placement Confirmation: positive ETCO2 Tube secured with: Tape Dental Injury: Teeth and Oropharynx as per pre-operative assessment

## 2016-11-01 NOTE — Anesthesia Postprocedure Evaluation (Addendum)
Anesthesia Post Note  Patient: Selden Noteboom  Procedure(s) Performed: Procedure(s) (LRB): EXCISION OF RIGHT GROIN SKIN LESION (N/A) EXCISION OF SEBACEOUS CYST ON BACK (N/A)  Patient location during evaluation: PACU Anesthesia Type: General Level of consciousness: awake, oriented and patient cooperative Pain management: pain level controlled Vital Signs Assessment: post-procedure vital signs reviewed and stable Respiratory status: spontaneous breathing, nonlabored ventilation, respiratory function stable and patient connected to nasal cannula oxygen Cardiovascular status: blood pressure returned to baseline and stable Postop Assessment: no signs of nausea or vomiting Anesthetic complications: no       Last Vitals:  Vitals:   11/01/16 1230 11/01/16 1245  BP: 124/62 130/66  Pulse: 61 60  Resp: 14 (!) 28  Temp:      Last Pain:  Vitals:   11/01/16 1230  TempSrc:   PainSc: 0-No pain                 Ontario Pettengill,JAMES TERRILL

## 2016-11-02 ENCOUNTER — Encounter (HOSPITAL_BASED_OUTPATIENT_CLINIC_OR_DEPARTMENT_OTHER): Payer: Self-pay | Admitting: Plastic Surgery

## 2016-11-05 NOTE — Addendum Note (Signed)
Addendum  created 11/05/16 0149 by Tawni Millers, CRNA   Charge Capture section accepted

## 2016-11-06 ENCOUNTER — Telehealth: Payer: Self-pay | Admitting: Oncology

## 2016-11-06 NOTE — Telephone Encounter (Signed)
Spoke with patient re f/u 4/25.

## 2016-11-12 ENCOUNTER — Ambulatory Visit (INDEPENDENT_AMBULATORY_CARE_PROVIDER_SITE_OTHER): Payer: Medicare Other | Admitting: Pharmacist Clinician (PhC)/ Clinical Pharmacy Specialist

## 2016-11-12 ENCOUNTER — Telehealth: Payer: Self-pay | Admitting: Oncology

## 2016-11-12 DIAGNOSIS — I4821 Permanent atrial fibrillation: Secondary | ICD-10-CM

## 2016-11-12 DIAGNOSIS — Z7901 Long term (current) use of anticoagulants: Secondary | ICD-10-CM

## 2016-11-12 DIAGNOSIS — I482 Chronic atrial fibrillation: Secondary | ICD-10-CM

## 2016-11-12 LAB — POCT INR: INR: 1.9

## 2016-11-12 NOTE — Telephone Encounter (Signed)
Patient called to cancel his appointment due to having a second surgery on the same issue.  He still has a 5/18 appointment but didn't know if the doctor wanted to see him sooner.

## 2016-11-13 ENCOUNTER — Ambulatory Visit: Payer: Self-pay | Admitting: Plastic Surgery

## 2016-11-13 ENCOUNTER — Encounter (HOSPITAL_BASED_OUTPATIENT_CLINIC_OR_DEPARTMENT_OTHER): Payer: Self-pay | Admitting: *Deleted

## 2016-11-13 ENCOUNTER — Telehealth: Payer: Self-pay | Admitting: Pharmacist Clinician (PhC)/ Clinical Pharmacy Specialist

## 2016-11-13 DIAGNOSIS — C4499 Other specified malignant neoplasm of skin, unspecified: Secondary | ICD-10-CM

## 2016-11-13 NOTE — Telephone Encounter (Signed)
Patient called this am.  Re-do surgical procedure has been moved to this Thursday.  Will have him continue to hold warfarin this week.  He should wait 2-3 days after procedure before resuming warfarin, due to continued bleeding after previous.  Patient voiced understanding.

## 2016-11-13 NOTE — Progress Notes (Signed)
Patient states his last dose of Coumadin was 11-11-16, had INR done 11-12-16 that was 1.9. States that Dr Marla Roe did not tell him he needed a repeat test just to stop his Coumadin.

## 2016-11-14 ENCOUNTER — Ambulatory Visit: Payer: Medicare Other | Admitting: Oncology

## 2016-11-15 ENCOUNTER — Encounter (HOSPITAL_BASED_OUTPATIENT_CLINIC_OR_DEPARTMENT_OTHER)
Admission: RE | Disposition: A | Discharge status: Home / Self Care | Payer: Self-pay | Source: Ambulatory Visit | Attending: Plastic Surgery

## 2016-11-15 ENCOUNTER — Ambulatory Visit (HOSPITAL_BASED_OUTPATIENT_CLINIC_OR_DEPARTMENT_OTHER): Payer: Medicare Other | Admitting: Anesthesiology

## 2016-11-15 ENCOUNTER — Ambulatory Visit (HOSPITAL_BASED_OUTPATIENT_CLINIC_OR_DEPARTMENT_OTHER)
Admission: RE | Admit: 2016-11-15 | Discharge: 2016-11-15 | Disposition: A | Discharge status: Home / Self Care | Payer: Medicare Other | Source: Ambulatory Visit | Attending: Plastic Surgery | Admitting: Plastic Surgery

## 2016-11-15 ENCOUNTER — Encounter (HOSPITAL_BASED_OUTPATIENT_CLINIC_OR_DEPARTMENT_OTHER): Payer: Self-pay | Admitting: Plastic Surgery

## 2016-11-15 DIAGNOSIS — E785 Hyperlipidemia, unspecified: Secondary | ICD-10-CM | POA: Diagnosis not present

## 2016-11-15 DIAGNOSIS — I10 Essential (primary) hypertension: Secondary | ICD-10-CM | POA: Diagnosis not present

## 2016-11-15 DIAGNOSIS — C44599 Other specified malignant neoplasm of skin of other part of trunk: Secondary | ICD-10-CM | POA: Diagnosis not present

## 2016-11-15 DIAGNOSIS — Z7901 Long term (current) use of anticoagulants: Secondary | ICD-10-CM | POA: Diagnosis not present

## 2016-11-15 DIAGNOSIS — I482 Chronic atrial fibrillation: Secondary | ICD-10-CM | POA: Diagnosis not present

## 2016-11-15 DIAGNOSIS — Z950 Presence of cardiac pacemaker: Secondary | ICD-10-CM | POA: Diagnosis not present

## 2016-11-15 DIAGNOSIS — C4499 Other specified malignant neoplasm of skin, unspecified: Secondary | ICD-10-CM

## 2016-11-15 DIAGNOSIS — C4490 Unspecified malignant neoplasm of skin, unspecified: Secondary | ICD-10-CM | POA: Diagnosis not present

## 2016-11-15 DIAGNOSIS — L989 Disorder of the skin and subcutaneous tissue, unspecified: Secondary | ICD-10-CM | POA: Diagnosis not present

## 2016-11-15 DIAGNOSIS — G4733 Obstructive sleep apnea (adult) (pediatric): Secondary | ICD-10-CM | POA: Diagnosis not present

## 2016-11-15 DIAGNOSIS — Z79899 Other long term (current) drug therapy: Secondary | ICD-10-CM | POA: Diagnosis not present

## 2016-11-15 DIAGNOSIS — Z87891 Personal history of nicotine dependence: Secondary | ICD-10-CM | POA: Insufficient documentation

## 2016-11-15 DIAGNOSIS — I4891 Unspecified atrial fibrillation: Secondary | ICD-10-CM | POA: Diagnosis not present

## 2016-11-15 HISTORY — PX: MASS EXCISION: SHX2000

## 2016-11-15 SURGERY — EXCISION MASS
Anesthesia: General | Site: Groin | Laterality: Right

## 2016-11-15 MED ORDER — OXYCODONE HCL 5 MG PO TABS: 5.0000 mg | ORAL_TABLET | ORAL | Status: DC | PRN

## 2016-11-15 MED ORDER — PROPOFOL 10 MG/ML IV BOLUS
INTRAVENOUS | Status: DC | PRN
  Administered 2016-11-15: 200 mg via INTRAVENOUS

## 2016-11-15 MED ORDER — LIDOCAINE HCL (CARDIAC) 20 MG/ML IV SOLN
INTRAVENOUS | Status: DC | PRN
  Administered 2016-11-15: 50 mg via INTRAVENOUS

## 2016-11-15 MED ORDER — SODIUM CHLORIDE 0.9% FLUSH: 3.0000 mL | INTRAVENOUS | Status: DC | PRN

## 2016-11-15 MED ORDER — FENTANYL CITRATE (PF) 100 MCG/2ML IJ SOLN
INTRAMUSCULAR | Status: DC | PRN
  Administered 2016-11-15: 25 ug via INTRAVENOUS
  Administered 2016-11-15: 50 ug via INTRAVENOUS
  Administered 2016-11-15: 25 ug via INTRAVENOUS

## 2016-11-15 MED ORDER — LIDOCAINE-EPINEPHRINE 1 %-1:100000 IJ SOLN
INTRAMUSCULAR | Status: DC | PRN
  Administered 2016-11-15: 5 mL

## 2016-11-15 MED ORDER — HYDROCODONE-ACETAMINOPHEN 5-325 MG PO TABS: 1.0000 | ORAL_TABLET | Freq: Two times a day (BID) | ORAL | 0 refills | Status: DC | PRN

## 2016-11-15 MED ORDER — PROPOFOL 10 MG/ML IV BOLUS
INTRAVENOUS | Status: AC
  Filled 2016-11-15: qty 20

## 2016-11-15 MED ORDER — MIDAZOLAM HCL 2 MG/2ML IJ SOLN
INTRAMUSCULAR | Status: AC
  Filled 2016-11-15: qty 2

## 2016-11-15 MED ORDER — PROMETHAZINE HCL 25 MG/ML IJ SOLN: 6.2500 mg | INTRAMUSCULAR | Status: DC | PRN

## 2016-11-15 MED ORDER — ONDANSETRON HCL 4 MG/2ML IJ SOLN
INTRAMUSCULAR | Status: AC
  Filled 2016-11-15: qty 2

## 2016-11-15 MED ORDER — ACETAMINOPHEN 325 MG PO TABS: 650.0000 mg | ORAL_TABLET | ORAL | Status: DC | PRN

## 2016-11-15 MED ORDER — HYDROMORPHONE HCL 1 MG/ML IJ SOLN
0.2500 mg | INTRAMUSCULAR | Status: DC | PRN
  Administered 2016-11-15 (×3): 0.5 mg via INTRAVENOUS

## 2016-11-15 MED ORDER — SODIUM CHLORIDE 0.9% FLUSH: 3.0000 mL | Freq: Two times a day (BID) | INTRAVENOUS | Status: DC

## 2016-11-15 MED ORDER — CEFAZOLIN SODIUM-DEXTROSE 2-4 GM/100ML-% IV SOLN
2.0000 g | INTRAVENOUS | Status: AC
  Administered 2016-11-15: 3 g via INTRAVENOUS

## 2016-11-15 MED ORDER — LIDOCAINE 2% (20 MG/ML) 5 ML SYRINGE
INTRAMUSCULAR | Status: AC
  Filled 2016-11-15: qty 5

## 2016-11-15 MED ORDER — LACTATED RINGERS IV SOLN
INTRAVENOUS | Status: DC
  Administered 2016-11-15 (×2): via INTRAVENOUS

## 2016-11-15 MED ORDER — SCOPOLAMINE 1 MG/3DAYS TD PT72: 1.0000 | MEDICATED_PATCH | Freq: Once | TRANSDERMAL | Status: DC | PRN

## 2016-11-15 MED ORDER — HYDROMORPHONE HCL 1 MG/ML IJ SOLN
INTRAMUSCULAR | Status: AC
  Filled 2016-11-15: qty 1

## 2016-11-15 MED ORDER — POLYMYXIN B SULFATE 500000 UNITS IJ SOLR
INTRAMUSCULAR | Status: DC | PRN
  Administered 2016-11-15: 500 mL

## 2016-11-15 MED ORDER — CEFAZOLIN SODIUM-DEXTROSE 2-4 GM/100ML-% IV SOLN
INTRAVENOUS | Status: AC
  Filled 2016-11-15: qty 200

## 2016-11-15 MED ORDER — ACETAMINOPHEN 650 MG RE SUPP: 650.0000 mg | RECTAL | Status: DC | PRN

## 2016-11-15 MED ORDER — MIDAZOLAM HCL 2 MG/2ML IJ SOLN: 1.0000 mg | INTRAMUSCULAR | Status: DC | PRN

## 2016-11-15 MED ORDER — SODIUM CHLORIDE 0.9 % IV SOLN: 250.0000 mL | INTRAVENOUS | Status: DC | PRN

## 2016-11-15 MED ORDER — FENTANYL CITRATE (PF) 100 MCG/2ML IJ SOLN: 50.0000 ug | INTRAMUSCULAR | Status: DC | PRN

## 2016-11-15 MED ORDER — FENTANYL CITRATE (PF) 100 MCG/2ML IJ SOLN
INTRAMUSCULAR | Status: AC
  Filled 2016-11-15: qty 2

## 2016-11-15 SURGICAL SUPPLY — 71 items
BENZOIN TINCTURE PRP APPL 2/3 (GAUZE/BANDAGES/DRESSINGS) IMPLANT
BLADE CLIPPER SURG (BLADE) IMPLANT
BLADE SURG 10 STRL SS (BLADE) ×3 IMPLANT
BLADE SURG 15 STRL LF DISP TIS (BLADE) ×1 IMPLANT
BLADE SURG 15 STRL SS (BLADE) ×2
BNDG CONFORM 2 STRL LF (GAUZE/BANDAGES/DRESSINGS) IMPLANT
BNDG ELASTIC 2X5.8 VLCR STR LF (GAUZE/BANDAGES/DRESSINGS) IMPLANT
CANISTER SUCT 1200ML W/VALVE (MISCELLANEOUS) ×3 IMPLANT
CHLORAPREP W/TINT 26ML (MISCELLANEOUS) ×3 IMPLANT
CLEANER CAUTERY TIP 5X5 PAD (MISCELLANEOUS) IMPLANT
CLOSURE WOUND 1/2 X4 (GAUZE/BANDAGES/DRESSINGS)
CORDS BIPOLAR (ELECTRODE) IMPLANT
COVER BACK TABLE 60X90IN (DRAPES) ×3 IMPLANT
COVER MAYO STAND STRL (DRAPES) ×3 IMPLANT
DECANTER SPIKE VIAL GLASS SM (MISCELLANEOUS) IMPLANT
DERMABOND ADVANCED (GAUZE/BANDAGES/DRESSINGS) ×2
DERMABOND ADVANCED .7 DNX12 (GAUZE/BANDAGES/DRESSINGS) ×1 IMPLANT
DRAIN CHANNEL 15F RND FF W/TCR (WOUND CARE) ×3 IMPLANT
DRAPE LAPAROTOMY 100X72 PEDS (DRAPES) ×3 IMPLANT
DRAPE U-SHAPE 76X120 STRL (DRAPES) ×3 IMPLANT
DRSG PAD ABDOMINAL 8X10 ST (GAUZE/BANDAGES/DRESSINGS) ×3 IMPLANT
DRSG TEGADERM 2-3/8X2-3/4 SM (GAUZE/BANDAGES/DRESSINGS) IMPLANT
DRSG TEGADERM 4X4.75 (GAUZE/BANDAGES/DRESSINGS) IMPLANT
ELECT COATED BLADE 2.86 ST (ELECTRODE) ×3 IMPLANT
ELECT NEEDLE BLADE 2-5/6 (NEEDLE) ×3 IMPLANT
ELECT REM PT RETURN 9FT ADLT (ELECTROSURGICAL) ×3
ELECT REM PT RETURN 9FT PED (ELECTROSURGICAL)
ELECTRODE REM PT RETRN 9FT PED (ELECTROSURGICAL) IMPLANT
ELECTRODE REM PT RTRN 9FT ADLT (ELECTROSURGICAL) ×1 IMPLANT
EVACUATOR SILICONE 100CC (DRAIN) ×3 IMPLANT
GAUZE SPONGE 4X4 12PLY STRL LF (GAUZE/BANDAGES/DRESSINGS) IMPLANT
GLOVE BIO SURGEON STRL SZ 6.5 (GLOVE) ×6 IMPLANT
GLOVE BIO SURGEONS STRL SZ 6.5 (GLOVE) ×3
GLOVE SURG SS PI 7.0 STRL IVOR (GLOVE) ×3 IMPLANT
GOWN STRL REUS W/ TWL LRG LVL3 (GOWN DISPOSABLE) ×3 IMPLANT
GOWN STRL REUS W/TWL LRG LVL3 (GOWN DISPOSABLE) ×6
NEEDLE HYPO 30GX1 BEV (NEEDLE) IMPLANT
NEEDLE PRECISIONGLIDE 27X1.5 (NEEDLE) ×3 IMPLANT
NS IRRIG 1000ML POUR BTL (IV SOLUTION) IMPLANT
PACK BASIN DAY SURGERY FS (CUSTOM PROCEDURE TRAY) ×3 IMPLANT
PAD CLEANER CAUTERY TIP 5X5 (MISCELLANEOUS)
PENCIL BUTTON HOLSTER BLD 10FT (ELECTRODE) ×3 IMPLANT
RUBBERBAND STERILE (MISCELLANEOUS) IMPLANT
SHEET MEDIUM DRAPE 40X70 STRL (DRAPES) ×3 IMPLANT
SLEEVE SCD COMPRESS KNEE MED (MISCELLANEOUS) ×3 IMPLANT
SPONGE GAUZE 2X2 8PLY STER LF (GAUZE/BANDAGES/DRESSINGS)
SPONGE GAUZE 2X2 8PLY STRL LF (GAUZE/BANDAGES/DRESSINGS) IMPLANT
STRIP CLOSURE SKIN 1/2X4 (GAUZE/BANDAGES/DRESSINGS) IMPLANT
SUCTION FRAZIER HANDLE 10FR (MISCELLANEOUS)
SUCTION TUBE FRAZIER 10FR DISP (MISCELLANEOUS) IMPLANT
SUT MNCRL 6-0 UNDY P1 1X18 (SUTURE) IMPLANT
SUT MNCRL AB 3-0 PS2 18 (SUTURE) ×6 IMPLANT
SUT MNCRL AB 4-0 PS2 18 (SUTURE) ×3 IMPLANT
SUT MON AB 5-0 P3 18 (SUTURE) IMPLANT
SUT MON AB 5-0 PS2 18 (SUTURE) ×3 IMPLANT
SUT MONOCRYL 6-0 P1 1X18 (SUTURE)
SUT PROLENE 5 0 P 3 (SUTURE) IMPLANT
SUT PROLENE 5 0 PS 2 (SUTURE) IMPLANT
SUT PROLENE 6 0 P 1 18 (SUTURE) IMPLANT
SUT SILK 3 0 PS 1 (SUTURE) ×9 IMPLANT
SUT VIC AB 5-0 P-3 18X BRD (SUTURE) IMPLANT
SUT VIC AB 5-0 P3 18 (SUTURE)
SUT VIC AB 5-0 PS2 18 (SUTURE) IMPLANT
SUT VICRYL 4-0 PS2 18IN ABS (SUTURE) IMPLANT
SYR BULB 3OZ (MISCELLANEOUS) IMPLANT
SYR BULB IRRIGATION 50ML (SYRINGE) ×3 IMPLANT
SYR CONTROL 10ML LL (SYRINGE) ×3 IMPLANT
TOWEL OR 17X24 6PK STRL BLUE (TOWEL DISPOSABLE) ×3 IMPLANT
TRAY DSU PREP LF (CUSTOM PROCEDURE TRAY) ×3 IMPLANT
TUBE CONNECTING 20'X1/4 (TUBING) ×1
TUBE CONNECTING 20X1/4 (TUBING) ×2 IMPLANT

## 2016-11-15 NOTE — Transfer of Care (Signed)
Immediate Anesthesia Transfer of Care Note  Patient: Cameron Sharp  Procedure(s) Performed: Procedure(s): EXCISION POSITIVE MARGIN RIGHT GROIN (Right)  Patient Location: PACU  Anesthesia Type:General  Level of Consciousness: awake, alert  and oriented  Airway & Oxygen Therapy: Patient Spontanous Breathing and Patient connected to face mask oxygen  Post-op Assessment: Report given to RN and Post -op Vital signs reviewed and stable  Post vital signs: Reviewed and stable  Last Vitals:  Vitals:   11/15/16 1329  Pulse: 69  Resp: 12  Temp: 36.6 C    Last Pain: There were no vitals filed for this visit.       Complications: No apparent anesthesia complications

## 2016-11-15 NOTE — H&P (View-Only) (Signed)
Cameron Sharp is an 81 y.o. male.   Chief Complaint: Paget's disease HPI: The patient is a 81 y.o. 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  He was referred to Dr. Alinda Money by Dr. Julieanne Manson by Dr. Ubaldo Glassing.  He noted a redness in the right groin and was being treatment for a rash. 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  There was concern about the location and getting the area closed after excision.  The area is ~ 4 x 5 cm in size on the right groin / inguinal area.  The scrotum is not involved grossly.  He has a pacemaker and is on coumadin.  He does his own INR testing.  He is scheduled for a pacemaker review 10/31/16.  He also has a 3 cm cyst on his back that he would like to have removed.  It has been present for years but he has not wanted to get it removed while on the coumadin.  Nothing makes it better and it seems to be getting bigger with time.       Past Medical History:  Diagnosis Date  . Arthritis    back and neck  . Atrial fibrillation ([place]) 10/10/2012  . Chronic back pain   . HTN (hypertension) 05/01/2013  . Hyperlipidemia   . Hypertension   . OSA on CPAP    wears CPAP nightly  . Pacemaker -[employer] 2010 05/01/2013  . Permanent atrial fibrillation [facility])     Past Surgical History:  Procedure Laterality Date  . carpel tunnel surgery Right 2012  . CHOLECYSTECTOMY    . COLONOSCOPY    . GALLBLADDER SURGERY  12/2006  . NM MYOCAR PERF WALL MOTION  05/22/07   no significant ischemia  . PERMANENT PACEMAKER INSERTION  01/09/2009   [facility]  . TONSILLECTOMY     age 15  . US ECHOCARDIOGRAPHY  11/24/07   mild LVH,LA & RA mod to severely dilated,mild mitral annular ca+,mild TR,mild Pulmonary hypertensio,AOV mod. sclerotic,mild AI w/root dilatation and ca+.    Family History  Problem Relation Age of Onset  . Cancer Father    Social History:  reports that he quit smoking about 40 years ago. He has never used smokeless tobacco. He reports  that he drinks alcohol. He reports that he does not use drugs.  Allergies: No Known Allergies  No prescriptions prior to admission.    Results for orders placed or performed during the hospital encounter of 11/01/16 (from the past 48 hour(s))  Comprehensive metabolic panel     Status: Abnormal   Collection Time: 10/30/16  7:30 AM  Result Value Ref Range   Sodium 143 135 - 145 mmol/L   Potassium 5.1 3.5 - 5.1 mmol/L   Chloride 105 101 - 111 mmol/L   CO2 28 22 - 32 mmol/L   Glucose, Bld 104 (H) 65 - 99 mg/dL   BUN 15 6 - 20 mg/dL   Creatinine, Ser 1.00 0.61 - 1.24 mg/dL   Calcium 9.4 8.9 - 10.3 mg/dL   Total Protein 6.7 6.5 - 8.1 g/dL   Albumin 4.2 3.5 - 5.0 g/dL   AST 24 15 - 41 U/L   ALT 14 (L) 17 - 63 U/L   Alkaline Phosphatase 49 38 - 126 U/L   Total Bilirubin 1.0 0.3 - 1.2 mg/dL   GFR calc non Af Amer >60 >60 mL/min   GFR calc Af Amer >60 >60 mL/min    Comment: (NOTE) The eGFR  has been calculated using the CKD EPI equation. This calculation has not been validated in all clinical situations. eGFR's persistently <60 mL/min signify possible Chronic Kidney Disease.    Anion gap 10 5 - 15  PT- INR at PAT visit (Pre-admission Testing)     Status: Abnormal   Collection Time: 10/30/16  7:30 AM  Result Value Ref Range   Prothrombin Time 18.2 (H) 11.4 - 15.2 seconds   INR 1.49    No results found.  Review of Systems  Constitutional: Negative.   HENT: Negative.   Eyes: Negative.   Respiratory: Negative.   Cardiovascular: Negative.   Gastrointestinal: Negative.   Genitourinary: Negative.   Musculoskeletal: Negative.   Skin: Negative.   Neurological: Negative.   Psychiatric/Behavioral: Negative.     Height _0  (1.905 m), weight 126.6 kg (279 lb). Physical Exam  Constitutional: He appears well-developed and well-nourished.  HENT:  Head: Normocephalic and atraumatic.  Eyes: EOM are normal. Pupils are equal, round, and reactive to light.  Cardiovascular: Normal  rate.   Respiratory: Effort normal.  GI: Soft. He exhibits no distension. There is no tenderness.  Musculoskeletal: He exhibits no edema.  Neurological: He is alert.  Skin: Skin is warm. No rash noted. No erythema. No pallor.  Psychiatric: He has a normal mood and affect. His behavior is normal. Judgment and thought content normal.     Assessment/Plan Excision of groin paget's disease lesion with possible closure and acell.  [name], DO 11/01/2016, 7:25 AM

## 2016-11-15 NOTE — Anesthesia Postprocedure Evaluation (Addendum)
Anesthesia Post Note  Patient: Cameron Sharp  Procedure(s) Performed: Procedure(s) (LRB): EXCISION POSITIVE MARGIN RIGHT GROIN (Right)  Patient location during evaluation: PACU Anesthesia Type: General Level of consciousness: sedated Pain management: pain level controlled Vital Signs Assessment: post-procedure vital signs reviewed and stable Respiratory status: spontaneous breathing and respiratory function stable Cardiovascular status: stable Anesthetic complications: no       Last Vitals:  Vitals:   11/15/16 1400 11/15/16 1415  BP: (!) 123/57 126/67  Pulse: (!) 59 66  Resp: 19 13  Temp:      Last Pain:  Vitals:   11/15/16 1400  PainSc: 4                  Jashayla Glatfelter DANIEL

## 2016-11-15 NOTE — Anesthesia Procedure Notes (Signed)
Procedure Name: LMA Insertion Date/Time: 11/15/2016 12:16 PM Performed by: Rayvon Char Pre-anesthesia Checklist: Patient identified, Emergency Drugs available, Suction available and Patient being monitored Patient Re-evaluated:Patient Re-evaluated prior to inductionOxygen Delivery Method: Circle system utilized Preoxygenation: Pre-oxygenation with 100% oxygen Intubation Type: IV induction Ventilation: Mask ventilation without difficulty LMA: LMA inserted LMA Size: 5.0 Number of attempts: 1 Dental Injury: Teeth and Oropharynx as per pre-operative assessment

## 2016-11-15 NOTE — Discharge Instructions (Signed)
Post Anesthesia Home Care Instructions  Activity: Get plenty of rest for the remainder of the day. A responsible individual must stay with you for 24 hours following the procedure.  For the next 24 hours, DO NOT: -Drive a car -Paediatric nurse -Drink alcoholic beverages -Take any medication unless instructed by your physician -Make any legal decisions or sign important papers.  Meals: Start with liquid foods such as gelatin or soup. Progress to regular foods as tolerated. Avoid greasy, spicy, heavy foods. If nausea and/or vomiting occur, drink only clear liquids until the nausea and/or vomiting subsides. Call your physician if vomiting continues.  Special Instructions/Symptoms: Your throat may feel dry or sore from the anesthesia or the breathing tube placed in your throat during surgery. If this causes discomfort, gargle with warm salt water. The discomfort should disappear within 24 hours.  If you had a scopolamine patch placed behind your ear for the management of post- operative nausea and/or vomiting:  1. The medication in the patch is effective for 72 hours, after which it should be removed.  Wrap patch in a tissue and discard in the trash. Wash hands thoroughly with soap and water. 2. You may remove the patch earlier than 72 hours if you experience unpleasant side effects which may include dry mouth, dizziness or visual disturbances. 3. Avoid touching the patch. Wash your hands with soap and water after contact with the patch.    About my Jackson-Pratt Bulb Drain  What is a Jackson-Pratt bulb? A Jackson-Pratt is a soft, round device used to collect drainage. It is connected to a long, thin drainage catheter, which is held in place by one or two small stiches near your surgical incision site. When the bulb is squeezed, it forms a vacuum, forcing the drainage to empty into the bulb.  Emptying the Jackson-Pratt bulb- To empty the bulb: 1. Release the plug on the top of the  bulb. 2. Pour the bulb's contents into a measuring container which your nurse will provide. 3. Record the time emptied and amount of drainage. Empty the drain(s) as often as your     doctor or nurse recommends.  Date                  Time                    Amount (Drain 1)                 Amount (Drain 2)  _____________________________________________________________________  _____________________________________________________________________  _____________________________________________________________________  _____________________________________________________________________  _____________________________________________________________________  _____________________________________________________________________  _____________________________________________________________________  _____________________________________________________________________  Squeezing the Jackson-Pratt Bulb- To squeeze the bulb: 1. Make sure the plug at the top of the bulb is open. 2. Squeeze the bulb tightly in your fist. You will hear air squeezing from the bulb. 3. Replace the plug while the bulb is squeezed. 4. Use a safety pin to attach the bulb to your clothing. This will keep the catheter from     pulling at the bulb insertion site.  When to call your doctor- Call your doctor if:  Drain site becomes red, swollen or hot.  You have a fever greater than 101 degrees F.  There is oozing at the drain site.  Drain falls out (apply a guaze bandage over the drain hole and secure it with tape).  Drainage increases daily not related to activity patterns. (You will usually have more drainage when you are active than when you are resting.)  Drainage has a  bad odor.  Post Anesthesia Home Care Instructions  Activity: Get plenty of rest for the remainder of the day. A responsible individual must stay with you for 24 hours following the procedure.  For the next 24 hours, DO NOT: -Drive a  car -Paediatric nurse -Drink alcoholic beverages -Take any medication unless instructed by your physician -Make any legal decisions or sign important papers.  Meals: Start with liquid foods such as gelatin or soup. Progress to regular foods as tolerated. Avoid greasy, spicy, heavy foods. If nausea and/or vomiting occur, drink only clear liquids until the nausea and/or vomiting subsides. Call your physician if vomiting continues.  Special Instructions/Symptoms: Your throat may feel dry or sore from the anesthesia or the breathing tube placed in your throat during surgery. If this causes discomfort, gargle with warm salt water. The discomfort should disappear within 24 hours.  If you had a scopolamine patch placed behind your ear for the management of post- operative nausea and/or vomiting:  1. The medication in the patch is effective for 72 hours, after which it should be removed.  Wrap patch in a tissue and discard in the trash. Wash hands thoroughly with soap and water. 2. You may remove the patch earlier than 72 hours if you experience unpleasant side effects which may include dry mouth, dizziness or visual disturbances. 3. Avoid touching the patch. Wash your hands with soap and water after contact with the patch.     Call your surgeon if you experience:   1.  Fever over 101.0. 2.  Inability to urinate. 3.  Nausea and/or vomiting. 4.  Extreme swelling or bruising at the surgical site. 5.  Continued bleeding from the incision. 6.  Increased pain, redness or drainage from the incision. 7.  Problems related to your pain medication. 8.  Any problems and/or concerns

## 2016-11-15 NOTE — Anesthesia Preprocedure Evaluation (Addendum)
Anesthesia Evaluation  Patient identified by MRN, date of birth, ID band Patient awake    Reviewed: Allergy & Precautions, NPO status , Patient's Chart, lab work & pertinent test results  History of Anesthesia Complications Negative for: history of anesthetic complications  Airway Mallampati: II  TM Distance: >3 FB Neck ROM: Full    Dental no notable dental hx. (+) Dental Advisory Given   Pulmonary sleep apnea and Continuous Positive Airway Pressure Ventilation , former smoker,    breath sounds clear to auscultation       Cardiovascular hypertension, + Peripheral Vascular Disease  + dysrhythmias Atrial Fibrillation + pacemaker  Rhythm:Irregular Rate:Normal     Neuro/Psych    GI/Hepatic negative GI ROS, Neg liver ROS,   Endo/Other  negative endocrine ROS  Renal/GU negative Renal ROS     Musculoskeletal  (+) Arthritis ,   Abdominal   Peds  Hematology   Anesthesia Other Findings   Reproductive/Obstetrics                           Anesthesia Physical  Anesthesia Plan  ASA: III  Anesthesia Plan: General   Post-op Pain Management:    Induction: Intravenous  Airway Management Planned: LMA  Additional Equipment:   Intra-op Plan:   Post-operative Plan: Extubation in OR  Informed Consent: I have reviewed the patients History and Physical, chart, labs and discussed the procedure including the risks, benefits and alternatives for the proposed anesthesia with the patient or authorized representative who has indicated his/her understanding and acceptance.   Dental advisory given  Plan Discussed with: CRNA and Anesthesiologist  Anesthesia Plan Comments:        Anesthesia Quick Evaluation

## 2016-11-15 NOTE — Op Note (Signed)
DATE OF OPERATION: 11/15/2016  LOCATION: [facility]  PREOPERATIVE DIAGNOSIS: Right groin extramammary paget's disease / lesion with positive margins  POSTOPERATIVE DIAGNOSIS: Same  PROCEDURE: Re-Excision of extramammary paget's lesion of right groin 2 x 9 cm   SURGEON: Claire Sanger Dillingham, DO  ASSISTANT: Shawn Rayburn, PA  EBL: 10* cc  CONDITION: Stable  COMPLICATIONS: None  INDICATION: The patient, Cameron Sharp, is a 81 y.o. male born on 07-28-1935, is here for treatment of extramammary paget's disease on the skin of the right groin.  He underwent excision ~ 1 week ago and had positive margins from 6 to 12 o'clock.   PROCEDURE DETAILS:  The patient was seen prior to surgery and marked.  The IV antibiotics were given. The patient was taken to the operating room and given a general anesthetic. A standard time out was performed and all information was confirmed by those in the room. SCDs were placed.   The area was prepped and draped in the usual sterile fashion.  The local was injected under the skin for postoperative pain management.  The are was marked for 1 cm from the previous incision site laterally for a 2 x 9 cm excision.  The needle tip bovie was used to excise the area.  The short stitch was placed at 12 o'clock, the long at 9 o'clock and the double at 6 o'clock.  Hemostasis was achieved with the electrocautery.  There was firm tissue at the base that needed to be excised so this was sent with the stitch at the new deep margin.  The area was irrigated with antibiotic solution.  A drain was placed and secured to the skin with a 4-0 Silk.  The deep layers were closed with 3-0 Monocryl followed by 4-0 Monocryl.  The skin was closed with 5-0 vertical mattress sutures.  Derma bond was applied.  A dry dressing was applied. The patient was allowed to wake up and taken to recovery room in stable condition at the end of the case. The family was notified at the end of the  case.

## 2016-11-15 NOTE — Interval H&P Note (Signed)
History and Physical Interval Note:  11/15/2016 10:42 AM  Cameron Sharp  has presented today for surgery, with the diagnosis of SKIN LESION ON GROIN   The various methods of treatment have been discussed with the patient and family. After consideration of risks, benefits and other options for treatment, the patient has consented to  Procedure(s): EXCISION POSITIVE MARGIN RIGHT GROIN (Right) as a surgical intervention .  The patient's history has been reviewed, patient examined, no change in status, stable for surgery.  I have reviewed the patient's chart and labs.  Questions were answered to the patient's satisfaction.     [name]

## 2016-11-16 ENCOUNTER — Encounter (HOSPITAL_BASED_OUTPATIENT_CLINIC_OR_DEPARTMENT_OTHER): Payer: Self-pay | Admitting: Plastic Surgery

## 2016-11-19 ENCOUNTER — Telehealth: Payer: Self-pay | Admitting: Pharmacist

## 2016-11-19 NOTE — Telephone Encounter (Signed)
Patient reports some drainage with some blood from incision site. Original plan was to resume warfarin today , but looking for recommendation due to bleeding when warfarin resumed after previous surgery.  Noted  A-Fib with pacemaker placement and CHADS2 score  = 2 for Age and HTN No previous hx of stroke, TIA or VTE noted   Recommendation:  1. Okay to hold warfarin for 2 more days  2. Resume warfarin on 11/22/16 as follows    Wednesday (5/3) - 2.5mg   Thursday (5/4) - 2.5mg   Friday(5/5) - 5mg   (Patient will call back to update clinic on surgical site bleeding/healing status)  Saturday (5/6) - 5mg   Sunday (5/7) - 5mg   Monday (5/8) -  Resume previously stable weekly dose

## 2016-11-23 ENCOUNTER — Telehealth: Payer: Self-pay | Admitting: Pharmacist Clinician (PhC)/ Clinical Pharmacy Specialist

## 2016-11-23 NOTE — Telephone Encounter (Signed)
Reviewed case with Dr. Claiborne Billings, will hold off on re-starting warfarin until 2-3 days after drain removed.    Patient called after appt with surgeon, will have drain removed on Monday.  He will restart warfarin Wednesday or Thursday, depending on healing of wound.

## 2016-11-23 NOTE — Telephone Encounter (Signed)
Patient called to report still having bleeding issues with incision site and in drain tube.  Took warfarin Wed/Thurs (1/2 dose - 2.5 mg instead of 5 mg) but feels as if bleeding has been worse since.  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  Advised to review with surgeon and call after with recommendation.  Advised patient that Dr. Claiborne Billings is in the office should the surgeon wish to talk to him.

## 2016-11-26 DIAGNOSIS — Z4803 Encounter for change or removal of drains: Secondary | ICD-10-CM | POA: Diagnosis not present

## 2016-11-26 DIAGNOSIS — C44599 Other specified malignant neoplasm of skin of other part of trunk: Secondary | ICD-10-CM | POA: Diagnosis not present

## 2016-11-26 DIAGNOSIS — Z483 Aftercare following surgery for neoplasm: Secondary | ICD-10-CM | POA: Diagnosis not present

## 2016-11-28 ENCOUNTER — Telehealth: Payer: Self-pay | Admitting: Pharmacist

## 2016-11-28 NOTE — Telephone Encounter (Signed)
Received call from Mr Cameron Sharp. Surgeon removed drainage and healing well but some bleeding remains.  Patient informed clinic he needs to hold warfarin for few more days.    Instructed to call back once able to resume warfarin.

## 2016-12-04 DIAGNOSIS — T8131XA Disruption of external operation (surgical) wound, not elsewhere classified, initial encounter: Secondary | ICD-10-CM | POA: Diagnosis not present

## 2016-12-04 DIAGNOSIS — S31103A Unspecified open wound of abdominal wall, right lower quadrant without penetration into peritoneal cavity, initial encounter: Secondary | ICD-10-CM | POA: Diagnosis not present

## 2016-12-04 DIAGNOSIS — C44599 Other specified malignant neoplasm of skin of other part of trunk: Secondary | ICD-10-CM | POA: Diagnosis not present

## 2016-12-06 ENCOUNTER — Ambulatory Visit (HOSPITAL_BASED_OUTPATIENT_CLINIC_OR_DEPARTMENT_OTHER): Payer: Medicare Other | Admitting: Oncology

## 2016-12-06 VITALS — BP 112/57 | HR 65 | Temp 98.1°F | Resp 18 | Ht 75.0 in | Wt 271.2 lb

## 2016-12-06 DIAGNOSIS — C632 Malignant neoplasm of scrotum: Secondary | ICD-10-CM | POA: Diagnosis not present

## 2016-12-06 DIAGNOSIS — I4891 Unspecified atrial fibrillation: Secondary | ICD-10-CM | POA: Diagnosis not present

## 2016-12-06 NOTE — Progress Notes (Addendum)
  [place] OFFICE PROGRESS NOTE   Diagnosis: Paget's disease of the right groin  INTERVAL HISTORY:   [name] returns as scheduled. He underwent excision of right groin extramammary Paget's disease by Dr. Marla Roe On 11/01/2016. The pathology revealed extramammary Paget's disease. Margins were involved with residual patches. The deep margin was negative. He was taken for reexcision procedure on 11/15/2016. A drain was placed. The pathology revealed extramammary Paget's disease. The resection margins are negative.  The wound is healing. He reports bleeding from the drain site while on Coumadin. The Coumadin was discontinued.  He is scheduled for a follow-up on with Dr. Marla Roe this week.  CTs of the chest, abdomen, and pelvis on 09/24/2016 revealed no evidence of a primary tumor or metastatic disease.  Objective:  Vital signs in last 24 hours:  Blood pressure (!) 112/57, pulse 65, temperature 98.1 F (36.7 C), temperature source Oral, resp. rate 18, height 6\' 3"  (1.905 m), weight 271 lb 3.2 oz (123 kg), SpO2 97 %.    Resp: Lungs clear bilaterally Cardio: Irregular GI: No hepatosplenomegaly, nontender, no mass Vascular: No leg edema  Skin: Healing incision with surrounding induration at the right groin. Rectal: Irregularity at the left posterior anal verge/distal anal canal. No discrete mass     Medications: I have reviewed the patient's current medications.  Assessment/Plan: 1. Extramammary Paget's disease of the right groin  CTs of the chest, abdomen, and pelvis 09/24/2016-negative for a primary tumor site or metastatic disease, single 5 mm left lower lobe nodule  Excisional biopsy 11/01/2016 confirmed extramammary Paget's disease with positive surgical margins  Reexcision 11/15/2016-extramammary Paget's disease, margins negative   2. Atrial fibrillation  3.   Sleep apnea  4.   Chronic back pain  5.   5 mm left lower lobe nodule noted  on a chest CT 09/24/2016     Disposition:  Cameron Sharp has undergone excision of the Paget's disease with negative surgical margins. No evidence of another primary tumor site or metastatic disease was found on physical exam or CT scans. No additional treatment indicated. He plans to continue clinical follow-up with Dr. Ubaldo Glassing. I am available to see him in the future as needed.  The 5 mm lung nodule is nonspecific. I will defer the decision on CT follow-up to Dr. Maudie Mercury.  There is slight irregularity at the anal verge on exam today. He will schedule an appointment with Dr. Earlean Shawl for an anoscopy.  He is not scheduled for a follow-up appointment at the [facility]. I am available to see him in the future as needed.  Cameron Coder, MD  12/06/2016  8:42 AM I reviewed the lung CT findings with radiology. A 1 year follow-up chest CT is recommended based on the patient's history of smoking and Paget's disease. I will communicate this recommendation to Dr. Maudie Mercury.

## 2016-12-07 ENCOUNTER — Ambulatory Visit: Payer: Medicare Other | Admitting: Oncology

## 2016-12-07 DIAGNOSIS — C44599 Other specified malignant neoplasm of skin of other part of trunk: Secondary | ICD-10-CM | POA: Diagnosis not present

## 2016-12-07 DIAGNOSIS — Z483 Aftercare following surgery for neoplasm: Secondary | ICD-10-CM | POA: Diagnosis not present

## 2016-12-10 ENCOUNTER — Ambulatory Visit (INDEPENDENT_AMBULATORY_CARE_PROVIDER_SITE_OTHER): Payer: Medicare Other | Admitting: Pharmacist

## 2016-12-10 DIAGNOSIS — I4821 Permanent atrial fibrillation: Secondary | ICD-10-CM

## 2016-12-10 DIAGNOSIS — I482 Chronic atrial fibrillation: Secondary | ICD-10-CM

## 2016-12-10 DIAGNOSIS — Z7901 Long term (current) use of anticoagulants: Secondary | ICD-10-CM

## 2016-12-10 NOTE — Progress Notes (Signed)
Hey Cameron Sharp,  It won't let me sign this encounter.  Says there is no INR value.

## 2016-12-14 DIAGNOSIS — L918 Other hypertrophic disorders of the skin: Secondary | ICD-10-CM | POA: Diagnosis not present

## 2016-12-14 DIAGNOSIS — D225 Melanocytic nevi of trunk: Secondary | ICD-10-CM | POA: Diagnosis not present

## 2016-12-14 DIAGNOSIS — L821 Other seborrheic keratosis: Secondary | ICD-10-CM | POA: Diagnosis not present

## 2016-12-14 DIAGNOSIS — D2262 Melanocytic nevi of left upper limb, including shoulder: Secondary | ICD-10-CM | POA: Diagnosis not present

## 2016-12-17 LAB — PROTIME-INR: INR: 1.4 — AB (ref <=1.1)

## 2016-12-18 ENCOUNTER — Ambulatory Visit (INDEPENDENT_AMBULATORY_CARE_PROVIDER_SITE_OTHER): Payer: Medicare Other | Admitting: Pharmacist

## 2016-12-18 ENCOUNTER — Encounter: Payer: Self-pay | Admitting: Pharmacist

## 2016-12-18 DIAGNOSIS — Z7901 Long term (current) use of anticoagulants: Secondary | ICD-10-CM

## 2016-12-18 DIAGNOSIS — I482 Chronic atrial fibrillation: Secondary | ICD-10-CM

## 2016-12-18 DIAGNOSIS — I4821 Permanent atrial fibrillation: Secondary | ICD-10-CM

## 2016-12-18 NOTE — Telephone Encounter (Signed)
This encounter was created in error - please disregard.

## 2016-12-19 DIAGNOSIS — Z86010 Personal history of colonic polyps: Secondary | ICD-10-CM | POA: Diagnosis not present

## 2016-12-20 DIAGNOSIS — M549 Dorsalgia, unspecified: Secondary | ICD-10-CM | POA: Diagnosis not present

## 2016-12-20 DIAGNOSIS — Z79899 Other long term (current) drug therapy: Secondary | ICD-10-CM | POA: Diagnosis not present

## 2016-12-20 DIAGNOSIS — M47816 Spondylosis without myelopathy or radiculopathy, lumbar region: Secondary | ICD-10-CM | POA: Diagnosis not present

## 2016-12-20 DIAGNOSIS — G894 Chronic pain syndrome: Secondary | ICD-10-CM | POA: Diagnosis not present

## 2016-12-20 DIAGNOSIS — Z79891 Long term (current) use of opiate analgesic: Secondary | ICD-10-CM | POA: Diagnosis not present

## 2016-12-21 ENCOUNTER — Telehealth: Payer: Self-pay | Admitting: Pharmacist Clinician (PhC)/ Clinical Pharmacy Specialist

## 2016-12-21 NOTE — Addendum Note (Signed)
Addendum  created 12/21/16 1343 by Rica Koyanagi, MD   Sign clinical note

## 2016-12-21 NOTE — Telephone Encounter (Signed)
Requesting permission to hold warfarin x 5 days for colonoscopy, scheduled for February 28, 2017.    Per office protocol, patient with permanent AF, CHADS2 score of 2 (age, hypertension).  Ok to hold warfarin x 5 days prior to procedure and re-start day after.    Faxed clearance form back to Dr. Earlean Shawl

## 2016-12-22 NOTE — Addendum Note (Signed)
Addendum  created 12/22/16 0859 by Duane Boston, MD   Sign clinical note

## 2016-12-26 DIAGNOSIS — L304 Erythema intertrigo: Secondary | ICD-10-CM | POA: Diagnosis not present

## 2016-12-27 ENCOUNTER — Ambulatory Visit (INDEPENDENT_AMBULATORY_CARE_PROVIDER_SITE_OTHER): Payer: Medicare Other | Admitting: Pharmacist

## 2016-12-27 DIAGNOSIS — I4821 Permanent atrial fibrillation: Secondary | ICD-10-CM

## 2016-12-27 DIAGNOSIS — I482 Chronic atrial fibrillation: Secondary | ICD-10-CM

## 2016-12-27 DIAGNOSIS — Z7901 Long term (current) use of anticoagulants: Secondary | ICD-10-CM

## 2016-12-27 DIAGNOSIS — Z952 Presence of prosthetic heart valve: Secondary | ICD-10-CM | POA: Diagnosis not present

## 2016-12-27 LAB — PROTIME-INR: INR: 2 — AB (ref 0.9–1.1)

## 2017-01-01 DIAGNOSIS — I482 Chronic atrial fibrillation: Secondary | ICD-10-CM | POA: Diagnosis not present

## 2017-01-01 DIAGNOSIS — C44599 Other specified malignant neoplasm of skin of other part of trunk: Secondary | ICD-10-CM | POA: Diagnosis not present

## 2017-01-01 DIAGNOSIS — Z7901 Long term (current) use of anticoagulants: Secondary | ICD-10-CM | POA: Diagnosis not present

## 2017-01-11 ENCOUNTER — Ambulatory Visit (INDEPENDENT_AMBULATORY_CARE_PROVIDER_SITE_OTHER): Payer: Medicare Other | Admitting: Pharmacist Clinician (PhC)/ Clinical Pharmacy Specialist

## 2017-01-11 DIAGNOSIS — I482 Chronic atrial fibrillation: Secondary | ICD-10-CM

## 2017-01-11 DIAGNOSIS — I4821 Permanent atrial fibrillation: Secondary | ICD-10-CM

## 2017-01-11 DIAGNOSIS — Z7901 Long term (current) use of anticoagulants: Secondary | ICD-10-CM

## 2017-01-11 LAB — POCT INR: INR: 2.8

## 2017-01-31 ENCOUNTER — Ambulatory Visit (INDEPENDENT_AMBULATORY_CARE_PROVIDER_SITE_OTHER): Payer: Self-pay | Admitting: Pharmacist

## 2017-01-31 DIAGNOSIS — I4821 Permanent atrial fibrillation: Secondary | ICD-10-CM

## 2017-01-31 DIAGNOSIS — I482 Chronic atrial fibrillation: Secondary | ICD-10-CM

## 2017-01-31 DIAGNOSIS — Z7901 Long term (current) use of anticoagulants: Secondary | ICD-10-CM

## 2017-01-31 LAB — PROTIME-INR: INR: 2.4 — AB (ref 0.9–1.1)

## 2017-02-14 DIAGNOSIS — G894 Chronic pain syndrome: Secondary | ICD-10-CM | POA: Diagnosis not present

## 2017-02-14 DIAGNOSIS — Z79899 Other long term (current) drug therapy: Secondary | ICD-10-CM | POA: Diagnosis not present

## 2017-02-14 DIAGNOSIS — Z79891 Long term (current) use of opiate analgesic: Secondary | ICD-10-CM | POA: Diagnosis not present

## 2017-02-14 DIAGNOSIS — M549 Dorsalgia, unspecified: Secondary | ICD-10-CM | POA: Diagnosis not present

## 2017-02-14 DIAGNOSIS — M47816 Spondylosis without myelopathy or radiculopathy, lumbar region: Secondary | ICD-10-CM | POA: Diagnosis not present

## 2017-02-21 ENCOUNTER — Ambulatory Visit (INDEPENDENT_AMBULATORY_CARE_PROVIDER_SITE_OTHER): Payer: Medicare Other | Admitting: Pharmacist

## 2017-02-21 DIAGNOSIS — I4821 Permanent atrial fibrillation: Secondary | ICD-10-CM

## 2017-02-21 DIAGNOSIS — Z7901 Long term (current) use of anticoagulants: Secondary | ICD-10-CM

## 2017-02-21 DIAGNOSIS — Z952 Presence of prosthetic heart valve: Secondary | ICD-10-CM | POA: Diagnosis not present

## 2017-02-21 DIAGNOSIS — I482 Chronic atrial fibrillation: Secondary | ICD-10-CM

## 2017-02-21 LAB — PROTIME-INR: INR: 2.1 — AB (ref 0.9–1.1)

## 2017-02-27 DIAGNOSIS — Z5181 Encounter for therapeutic drug level monitoring: Secondary | ICD-10-CM | POA: Diagnosis not present

## 2017-02-28 DIAGNOSIS — K648 Other hemorrhoids: Secondary | ICD-10-CM | POA: Diagnosis not present

## 2017-02-28 DIAGNOSIS — Z86010 Personal history of colonic polyps: Secondary | ICD-10-CM | POA: Diagnosis not present

## 2017-02-28 DIAGNOSIS — Z1211 Encounter for screening for malignant neoplasm of colon: Secondary | ICD-10-CM | POA: Diagnosis not present

## 2017-02-28 DIAGNOSIS — K5730 Diverticulosis of large intestine without perforation or abscess without bleeding: Secondary | ICD-10-CM | POA: Diagnosis not present

## 2017-02-28 DIAGNOSIS — D124 Benign neoplasm of descending colon: Secondary | ICD-10-CM | POA: Diagnosis not present

## 2017-02-28 DIAGNOSIS — K635 Polyp of colon: Secondary | ICD-10-CM | POA: Diagnosis not present

## 2017-03-04 ENCOUNTER — Telehealth: Payer: Self-pay | Admitting: Cardiovascular Disease

## 2017-03-04 DIAGNOSIS — Z7901 Long term (current) use of anticoagulants: Secondary | ICD-10-CM | POA: Diagnosis not present

## 2017-03-04 DIAGNOSIS — C44599 Other specified malignant neoplasm of skin of other part of trunk: Secondary | ICD-10-CM | POA: Diagnosis not present

## 2017-03-04 DIAGNOSIS — Z483 Aftercare following surgery for neoplasm: Secondary | ICD-10-CM | POA: Diagnosis not present

## 2017-03-04 DIAGNOSIS — C4499 Other specified malignant neoplasm of skin, unspecified: Secondary | ICD-10-CM | POA: Diagnosis not present

## 2017-03-04 DIAGNOSIS — K635 Polyp of colon: Secondary | ICD-10-CM | POA: Diagnosis not present

## 2017-03-04 DIAGNOSIS — D126 Benign neoplasm of colon, unspecified: Secondary | ICD-10-CM | POA: Diagnosis not present

## 2017-03-04 NOTE — Telephone Encounter (Signed)
Dr.Dillingham is calling in regards to Cameron Sharp .

## 2017-03-04 NOTE — Telephone Encounter (Signed)
I spoke with Dr. Boston Service.  Since the patient had just had colonoscopy last week and is not yet fully therapeutic on anticoagulation.  I would prefer the patient become therapeutic for at least several weeks prior to holding Coumadin again.  For this reason, we discussed deferring the surgery for several weeks rather than doing next week.

## 2017-03-14 ENCOUNTER — Ambulatory Visit (INDEPENDENT_AMBULATORY_CARE_PROVIDER_SITE_OTHER): Payer: Medicare Other | Admitting: Pharmacist

## 2017-03-14 DIAGNOSIS — I482 Chronic atrial fibrillation: Secondary | ICD-10-CM

## 2017-03-14 DIAGNOSIS — I4821 Permanent atrial fibrillation: Secondary | ICD-10-CM

## 2017-03-14 DIAGNOSIS — Z7901 Long term (current) use of anticoagulants: Secondary | ICD-10-CM

## 2017-03-14 LAB — PROTIME-INR: INR: 2.1 — AB (ref 0.9–1.1)

## 2017-03-21 ENCOUNTER — Telehealth: Payer: Self-pay | Admitting: Pharmacist Clinician (PhC)/ Clinical Pharmacy Specialist

## 2017-03-21 NOTE — Telephone Encounter (Signed)
Coumadin clinic  

## 2017-03-27 ENCOUNTER — Ambulatory Visit (INDEPENDENT_AMBULATORY_CARE_PROVIDER_SITE_OTHER): Payer: Medicare Other | Admitting: Cardiovascular Disease

## 2017-03-27 ENCOUNTER — Encounter: Payer: Self-pay | Admitting: Cardiovascular Disease

## 2017-03-27 VITALS — BP 110/60 | HR 60 | Ht 75.0 in | Wt 265.3 lb

## 2017-03-27 DIAGNOSIS — I482 Chronic atrial fibrillation: Secondary | ICD-10-CM

## 2017-03-27 DIAGNOSIS — G4733 Obstructive sleep apnea (adult) (pediatric): Secondary | ICD-10-CM

## 2017-03-27 DIAGNOSIS — E669 Obesity, unspecified: Secondary | ICD-10-CM | POA: Diagnosis not present

## 2017-03-27 DIAGNOSIS — Z9989 Dependence on other enabling machines and devices: Secondary | ICD-10-CM | POA: Diagnosis not present

## 2017-03-27 DIAGNOSIS — I10 Essential (primary) hypertension: Secondary | ICD-10-CM | POA: Diagnosis not present

## 2017-03-27 DIAGNOSIS — Z7901 Long term (current) use of anticoagulants: Secondary | ICD-10-CM | POA: Diagnosis not present

## 2017-03-27 DIAGNOSIS — Z950 Presence of cardiac pacemaker: Secondary | ICD-10-CM

## 2017-03-27 DIAGNOSIS — I4821 Permanent atrial fibrillation: Secondary | ICD-10-CM

## 2017-03-27 LAB — CUP PACEART INCLINIC DEVICE CHECK
Date Time Interrogation Session: 20180905093311
Implantable Lead Location: 753860
Implantable Pulse Generator Implant Date: 20100720
Lead Channel Impedance Value: 482 Ohm
Lead Channel Pacing Threshold Pulse Width: 0.4 ms
Lead Channel Sensing Intrinsic Amplitude: 11.6 mV
MDC IDC LEAD IMPLANT DT: 20100720
MDC IDC MSMT BATTERY IMPEDANCE: 1300 Ohm
MDC IDC MSMT BATTERY VOLTAGE: 2.78 V
MDC IDC MSMT LEADCHNL RV PACING THRESHOLD AMPLITUDE: 0.5 V
MDC IDC SET LEADCHNL RV PACING AMPLITUDE: 2.5 V
MDC IDC SET LEADCHNL RV PACING PULSEWIDTH: 0.4 ms
MDC IDC SET LEADCHNL RV SENSING SENSITIVITY: 2 mV
Pulse Gen Serial Number: 7043529

## 2017-03-27 MED ORDER — METOPROLOL TARTRATE 25 MG PO TABS: 12.5000 mg | ORAL_TABLET | Freq: Every morning | ORAL | 3 refills | Status: DC

## 2017-03-27 NOTE — Progress Notes (Signed)
Cardiology Office Note    Date:  03/27/2017   ID:  Cameron Sharp, DOB 11/19/1935, MRN [id number]  PCP:  [name], MD  Cardiologist: Shelva Majestic, M.D.;  Sanda Klein, MD   Chief Complaint  Patient presents with  . Follow-up    pt reports no complaints; pt d/t have groin surgery next week by Dr Loletha Grayer Dillingham    History of Present Illness:  Cameron Sharp is a 81 y.o. male with after with slow ventricular response and a single-chamber [employer] Jude Zephyr pacemaker implanted in 2010, as well severe obesity, obstructive sleep apnea, hypertension and hyperlipidemia.   His back problems make him use a cane or sometimes a walker, but this doesn't stop him from going to the golf driving range. He denies syncope, dizziness or palpitations. He has not had any serious bleeding complications or focal neurological events. He denies exertional angina or dyspnea. He has lost 6 more pounds over the last half year.  He is due to undergo a third surgery for his scrotal Paget's disease to widen the resections margins. He will stop his warfarin 5 days before the procedure which is scheduled in a couple of weeks.  Pacemaker interrogation shows normal device function. He has 74% ventricular pacing and there are no episodes of high ventricular rates. Heart rate histograms remain slightly blunted, appropriate for his activity level Estimated generator longevity is 4.5-6.25 years. Lead parameters are normal.   Past Medical History:  Diagnosis Date  . Arthritis    back and neck  . Atrial fibrillation ([place]) 10/10/2012  . Chronic back pain   . HTN (hypertension) 05/01/2013  . Hyperlipidemia   . Hypertension   . OSA on CPAP    wears CPAP nightly  . Pacemaker -Melissa 2010 05/01/2013  . Permanent atrial fibrillation [facility])     Past Surgical History:  Procedure Laterality Date  . carpel tunnel surgery Right 2012  . CHOLECYSTECTOMY    . COLONOSCOPY    . GALLBLADDER SURGERY  12/2006   . LIPOMA EXCISION N/A 11/01/2016   Procedure: EXCISION OF SEBACEOUS CYST ON BACK;  Surgeon: [name], DO;  Location: [facility];  Service: Plastics;  Laterality: N/A;  . MASS EXCISION N/A 11/01/2016   Procedure: EXCISION OF RIGHT GROIN SKIN LESION;  Surgeon: [name], DO;  Location: [facility];  Service: Plastics;  Laterality: N/A;  . MASS EXCISION Right 11/15/2016   Procedure: EXCISION POSITIVE MARGIN RIGHT GROIN;  Surgeon: [name], DO;  Location: [facility];  Service: Plastics;  Laterality: Right;  . NM MYOCAR PERF WALL MOTION  05/22/07   no significant ischemia  . PERMANENT PACEMAKER INSERTION  01/09/2009   St.Jude  . TONSILLECTOMY     age 21  . US ECHOCARDIOGRAPHY  11/24/07   mild LVH,LA & RA mod to severely dilated,mild mitral annular ca+,mild TR,mild Pulmonary hypertensio,AOV mod. sclerotic,mild AI w/root dilatation and ca+.    Current Medications: Outpatient Medications Prior to Visit  Medication Sig Dispense Refill  . atorvastatin (LIPITOR) 20 MG tablet Take 1 tablet (20 mg total) by mouth daily. 90 tablet 3  . baclofen (LIORESAL) 10 MG tablet Take 1 tablet by mouth 2 (two) times daily.  0  . COUMADIN 5 MG tablet TAKE 1 TABLET DAILY OR AS DIRECTED 90 tablet 3  . docusate sodium (COLACE) 100 MG capsule Take 100 mg by mouth 2 (two) times daily.    Marland Kitchen gabapentin (NEURONTIN) 300  MG capsule Take 900 mg by mouth daily.     Marland Kitchen HYDROcodone-acetaminophen (NORCO) 5-325 MG tablet Take 1 tablet by mouth every 12 (twelve) hours as needed for moderate pain. 20 tablet 0  . metoprolol tartrate (LOPRESSOR) 25 MG tablet TAKE ONE-HALF (12.5 MG) TABLET TWICE A DAY 90 tablet 3  . Thiamine HCl (VITAMIN B-1) 250 MG tablet Take 250 mg by mouth daily.    Marland Kitchen ULORIC 40 MG tablet Take 1 tablet (40 mg total) by mouth daily. 30 tablet 6  . valsartan-hydrochlorothiazide (DIOVAN-HCT) 160-12.5 MG tablet Take 1 tablet by mouth daily. 90 tablet  3  . HYDROcodone-acetaminophen (NORCO) 10-325 MG per tablet Take 1 tablet by mouth every 6 (six) hours as needed for pain.     No facility-administered medications prior to visit.      Allergies:   Patient has no known allergies.   Social History   Social History  . Marital status: Married    Spouse name: N/A  . Number of children: N/A  . Years of education: N/A   Social History Main Topics  . Smoking status: Former Smoker    Quit date: 07/23/1976  . Smokeless tobacco: Never Used  . Alcohol use 0.0 oz/week     Comment: 2oz scotch daily  . Drug use: No  . Sexual activity: Not Asked   Other Topics Concern  . None   Social History Narrative  . None     Family History:  The patient's family history includes Cancer in his father.   ROS:   Please see the history of present illness.    ROS All other systems reviewed and are negative.   PHYSICAL EXAM:   VS:  BP 110/60   Pulse 60   Ht 6\' 3"  (1.905 m)   Wt 265 lb 4.8 oz (120.3 kg)   BMI 33.16 kg/m     General: Alert, oriented x3, no , mildly obese Head: no evidence of trauma, PERRL, EOMI, no exophtalmos or lid lag, no myxedema, no xanthelasma; normal ears, nose and oropharynx Neck: normal jugular venous pulsations and no hepatojugular reflux; brisk carotid pulses without delay and no carotid bruits Chest: clear to auscultation, no signs of consolidation by percussion or palpation, normal fremitus, symmetrical and full respiratory excursions Cardiovascular: normal position and quality of the apical impulse, regular rhythm, normal first and Paradoxically split second heart sounds, no murmurs, rubs or gallops Abdomen: no tenderness or distention, no masses by palpation, no abnormal pulsatility or arterial bruits, normal bowel sounds, no hepatosplenomegaly Extremities: no clubbing, cyanosis or edema; 2+ radial, ulnar and brachial pulses bilaterally; 2+ right femoral, posterior tibial and dorsalis pedis pulses; 2+ left femoral,  posterior tibial and dorsalis pedis pulses; no subclavian or femoral bruits Neurological: grossly nonfocal Psych: euthymic mood, full affect  Wt Readings from Last 3 Encounters:  03/27/17 265 lb 4.8 oz (120.3 kg)  12/06/16 271 lb 3.2 oz (123 kg)  11/13/16 272 lb (123.4 kg)      Studies/Labs Reviewed:   EKG:  EKG is ordered today. It shows background atrial fibrillation with 100% ventricular paced rhythm  Recent Labs: 10/30/2016: ALT 14; BUN 15; Creatinine, Ser 1.00; Potassium 5.1; Sodium 143   Lipid Panel    Component Value Date/Time   CHOL 173 10/25/2014 0838   TRIG 129 10/25/2014 0838   HDL 65 10/25/2014 0838   CHOLHDL 2.7 10/25/2014 0838   VLDL 26 10/25/2014 0838   LDLCALC 82 10/25/2014 0838    ASSESSMENT:  1. Permanent atrial fibrillation ([place])   2. Pacemaker -[place]ard 2010   3. Long term current use of anticoagulant therapy   4. Essential hypertension   5. OSA on CPAP   6. Mild obesity      PLAN:  In order of problems listed above:  1. Afib: Slow ventricular response. Recommended that he discontinue the morning dose of metoprolol since his percentage of ventricular pacing has increased substantially. If he does not have high ventricular response episodes at his next appointment in 6 months, would recommend discontinuing that medication altogether. The planned surgery should not interfere with normal pacemaker function. 2. PM: Normal pacemaker function. No reprogramming changes necessary. Unfortunately his device is not amenable to remote monitoring. Follow-up in office pacemaker checks every 6 months. 3. Warfarin: CHADSVasc 3 (age 60, HTN). Relatively low embolic risk, does not need bridging before his surgery. 4. HTN: Excellent control, I do not think that cutting back the metoprolol will change that 5. OSA: Reports compliance with CPAP 6. Obesity: improved to mildly obese range. He continues to steadily lose weight and is congratulated for  that.    Medication Adjustments/Labs and Tests Ordered: Current medicines are reviewed at length with the patient today.  Concerns regarding medicines are outlined above.  Medication changes, Labs and Tests ordered today are listed in the Patient Instructions below. Patient Instructions  Dr Sallyanne Kuster recommends that you schedule a follow-up appointment in 6 months with a pacemaker check. You will receive a reminder letter in the mail two months in advance. If you don't receive a letter, please call our office to schedule the follow-up appointment.  If you need a refill on your cardiac medications before your next appointment, please call your pharmacy.   Reduce metoprolol tartrate to 12.5 mg in the morning only, once daily.  Signed, Sanda Klein, MD  03/27/2017 9:45 AM    [facility] [address], [place], [place]  [address] Phone: [phone]; Fax: [phone]

## 2017-03-27 NOTE — Patient Instructions (Signed)
Dr Croitoru recommends that you schedule a follow-up appointment in 6 months with a pacemaker check. You will receive a reminder letter in the mail two months in advance. If you don't receive a letter, please call our office to schedule the follow-up appointment.  If you need a refill on your cardiac medications before your next appointment, please call your pharmacy. 

## 2017-03-29 ENCOUNTER — Ambulatory Visit: Payer: Self-pay | Admitting: Plastic Surgery

## 2017-03-29 ENCOUNTER — Encounter (HOSPITAL_BASED_OUTPATIENT_CLINIC_OR_DEPARTMENT_OTHER): Payer: Self-pay | Admitting: *Deleted

## 2017-03-29 ENCOUNTER — Telehealth: Payer: Self-pay | Admitting: Pharmacist

## 2017-03-29 DIAGNOSIS — C4499 Other specified malignant neoplasm of skin, unspecified: Secondary | ICD-10-CM

## 2017-03-29 NOTE — Telephone Encounter (Signed)
Patient called today to report he is having surgery on 04/04/2017.   Last warfarin dose today, then resume ASAP after surgery.   Patient with diagnosis of atrial fibrillation on warfarin for anticoagulation.    Date of procedure: 04/04/2017  CHADS2 score of 2 (HTN, AGE)  Per office protocol, patient can hold warfarin for 5 days prior to procedure.   Patient will not need bridging with Lovenox (enoxaparin) around procedure.  If not bridging, patient should restart warfarin on the evening of procedure or day after, at discretion of procedure MD

## 2017-03-29 NOTE — Progress Notes (Signed)
Patient's last dose Coumadin is 03-29-17 per Dr Sallyanne Kuster and has note from cards visit 03-27-17 that surgery will not interfere with pacer. He will come in 04-03-17 for PT/INR and BMET.

## 2017-04-03 ENCOUNTER — Encounter (HOSPITAL_BASED_OUTPATIENT_CLINIC_OR_DEPARTMENT_OTHER)
Admission: RE | Admit: 2017-04-03 | Discharge: 2017-04-03 | Disposition: A | Discharge status: Home / Self Care | Payer: Medicare Other | Source: Ambulatory Visit | Attending: Plastic Surgery | Admitting: Plastic Surgery

## 2017-04-03 DIAGNOSIS — I10 Essential (primary) hypertension: Secondary | ICD-10-CM | POA: Diagnosis not present

## 2017-04-03 DIAGNOSIS — C44599 Other specified malignant neoplasm of skin of other part of trunk: Secondary | ICD-10-CM | POA: Diagnosis not present

## 2017-04-03 DIAGNOSIS — I482 Chronic atrial fibrillation: Secondary | ICD-10-CM | POA: Diagnosis not present

## 2017-04-03 DIAGNOSIS — Z950 Presence of cardiac pacemaker: Secondary | ICD-10-CM | POA: Diagnosis not present

## 2017-04-03 DIAGNOSIS — Z7901 Long term (current) use of anticoagulants: Secondary | ICD-10-CM | POA: Diagnosis not present

## 2017-04-03 DIAGNOSIS — G4733 Obstructive sleep apnea (adult) (pediatric): Secondary | ICD-10-CM | POA: Diagnosis not present

## 2017-04-03 DIAGNOSIS — L821 Other seborrheic keratosis: Secondary | ICD-10-CM | POA: Diagnosis not present

## 2017-04-03 DIAGNOSIS — Z87891 Personal history of nicotine dependence: Secondary | ICD-10-CM | POA: Diagnosis not present

## 2017-04-03 LAB — BASIC METABOLIC PANEL
ANION GAP: 4 — AB (ref 5–15)
BUN: 19 mg/dL (ref 6–20)
CO2: 26 mmol/L (ref 22–32)
Calcium: 9.2 mg/dL (ref 8.9–10.3)
Chloride: 108 mmol/L (ref 101–111)
Creatinine, Ser: 0.99 mg/dL (ref 0.61–1.24)
GFR calc Af Amer: >60 mL/min (ref >60)
GLUCOSE: 96 mg/dL (ref 65–99)
POTASSIUM: 4.6 mmol/L (ref 3.5–5.1)
Sodium: 138 mmol/L (ref 135–145)

## 2017-04-03 LAB — PROTIME-INR
INR: 1.13
Prothrombin Time: 14.5 seconds (ref 11.4–15.2)

## 2017-04-04 ENCOUNTER — Encounter (HOSPITAL_BASED_OUTPATIENT_CLINIC_OR_DEPARTMENT_OTHER): Payer: Self-pay | Admitting: Anesthesiology

## 2017-04-04 ENCOUNTER — Ambulatory Visit (HOSPITAL_BASED_OUTPATIENT_CLINIC_OR_DEPARTMENT_OTHER): Payer: Medicare Other | Admitting: Anesthesiology

## 2017-04-04 ENCOUNTER — Encounter (HOSPITAL_BASED_OUTPATIENT_CLINIC_OR_DEPARTMENT_OTHER)
Admission: RE | Disposition: A | Discharge status: Home / Self Care | Payer: Self-pay | Source: Ambulatory Visit | Attending: Plastic Surgery

## 2017-04-04 ENCOUNTER — Ambulatory Visit (HOSPITAL_BASED_OUTPATIENT_CLINIC_OR_DEPARTMENT_OTHER)
Admission: RE | Admit: 2017-04-04 | Discharge: 2017-04-04 | Disposition: A | Discharge status: Home / Self Care | Payer: Medicare Other | Source: Ambulatory Visit | Attending: Plastic Surgery | Admitting: Plastic Surgery

## 2017-04-04 DIAGNOSIS — I482 Chronic atrial fibrillation: Secondary | ICD-10-CM | POA: Insufficient documentation

## 2017-04-04 DIAGNOSIS — C4499 Other specified malignant neoplasm of skin, unspecified: Secondary | ICD-10-CM

## 2017-04-04 DIAGNOSIS — I10 Essential (primary) hypertension: Secondary | ICD-10-CM | POA: Diagnosis not present

## 2017-04-04 DIAGNOSIS — G4733 Obstructive sleep apnea (adult) (pediatric): Secondary | ICD-10-CM | POA: Diagnosis not present

## 2017-04-04 DIAGNOSIS — Z7901 Long term (current) use of anticoagulants: Secondary | ICD-10-CM | POA: Diagnosis not present

## 2017-04-04 DIAGNOSIS — I4891 Unspecified atrial fibrillation: Secondary | ICD-10-CM | POA: Diagnosis not present

## 2017-04-04 DIAGNOSIS — M889 Osteitis deformans of unspecified bone: Secondary | ICD-10-CM | POA: Diagnosis not present

## 2017-04-04 DIAGNOSIS — C44509 Unspecified malignant neoplasm of skin of other part of trunk: Secondary | ICD-10-CM | POA: Diagnosis not present

## 2017-04-04 DIAGNOSIS — E785 Hyperlipidemia, unspecified: Secondary | ICD-10-CM | POA: Diagnosis not present

## 2017-04-04 DIAGNOSIS — Z87891 Personal history of nicotine dependence: Secondary | ICD-10-CM | POA: Diagnosis not present

## 2017-04-04 DIAGNOSIS — Z950 Presence of cardiac pacemaker: Secondary | ICD-10-CM | POA: Diagnosis not present

## 2017-04-04 DIAGNOSIS — C44599 Other specified malignant neoplasm of skin of other part of trunk: Secondary | ICD-10-CM | POA: Insufficient documentation

## 2017-04-04 HISTORY — DX: Gout, unspecified: M10.9

## 2017-04-04 HISTORY — PX: MASS EXCISION: SHX2000

## 2017-04-04 SURGERY — EXCISION MASS
Anesthesia: General | Site: Groin | Laterality: Right

## 2017-04-04 MED ORDER — OXYCODONE HCL 5 MG PO TABS: 5.0000 mg | ORAL_TABLET | ORAL | Status: DC | PRN

## 2017-04-04 MED ORDER — FENTANYL CITRATE (PF) 100 MCG/2ML IJ SOLN
INTRAMUSCULAR | Status: AC
  Filled 2017-04-04: qty 2

## 2017-04-04 MED ORDER — BUPIVACAINE-EPINEPHRINE (PF) 0.25% -1:200000 IJ SOLN
INTRAMUSCULAR | Status: AC
  Filled 2017-04-04: qty 30

## 2017-04-04 MED ORDER — CEFAZOLIN SODIUM-DEXTROSE 2-4 GM/100ML-% IV SOLN
2.0000 g | INTRAVENOUS | Status: AC
  Administered 2017-04-04: 2 g via INTRAVENOUS

## 2017-04-04 MED ORDER — SODIUM CHLORIDE 0.9% FLUSH: 3.0000 mL | Freq: Two times a day (BID) | INTRAVENOUS | Status: DC

## 2017-04-04 MED ORDER — DEXAMETHASONE SODIUM PHOSPHATE 10 MG/ML IJ SOLN
INTRAMUSCULAR | Status: AC
  Filled 2017-04-04: qty 1

## 2017-04-04 MED ORDER — LACTATED RINGERS IV SOLN
INTRAVENOUS | Status: DC
  Administered 2017-04-04: 10 mL/h via INTRAVENOUS

## 2017-04-04 MED ORDER — PHENYLEPHRINE 40 MCG/ML (10ML) SYRINGE FOR IV PUSH (FOR BLOOD PRESSURE SUPPORT)
PREFILLED_SYRINGE | INTRAVENOUS | Status: AC
  Filled 2017-04-04: qty 10

## 2017-04-04 MED ORDER — DEXAMETHASONE SODIUM PHOSPHATE 4 MG/ML IJ SOLN
INTRAMUSCULAR | Status: DC | PRN
  Administered 2017-04-04: 10 mg via INTRAVENOUS

## 2017-04-04 MED ORDER — CEFAZOLIN SODIUM-DEXTROSE 2-4 GM/100ML-% IV SOLN
INTRAVENOUS | Status: AC
  Filled 2017-04-04: qty 100

## 2017-04-04 MED ORDER — ACETAMINOPHEN 650 MG RE SUPP: 650.0000 mg | RECTAL | Status: DC | PRN

## 2017-04-04 MED ORDER — SODIUM CHLORIDE 0.9% FLUSH: 3.0000 mL | INTRAVENOUS | Status: DC | PRN

## 2017-04-04 MED ORDER — LIDOCAINE 2% (20 MG/ML) 5 ML SYRINGE
INTRAMUSCULAR | Status: AC
  Filled 2017-04-04: qty 5

## 2017-04-04 MED ORDER — FENTANYL CITRATE (PF) 100 MCG/2ML IJ SOLN
50.0000 ug | INTRAMUSCULAR | Status: DC | PRN
  Administered 2017-04-04: 100 ug via INTRAVENOUS

## 2017-04-04 MED ORDER — ONDANSETRON HCL 4 MG/2ML IJ SOLN
INTRAMUSCULAR | Status: DC | PRN
  Administered 2017-04-04: 4 mg via INTRAVENOUS

## 2017-04-04 MED ORDER — LIDOCAINE HCL (CARDIAC) 20 MG/ML IV SOLN
INTRAVENOUS | Status: DC | PRN
  Administered 2017-04-04: 50 mg via INTRAVENOUS

## 2017-04-04 MED ORDER — SCOPOLAMINE 1 MG/3DAYS TD PT72: 1.0000 | MEDICATED_PATCH | Freq: Once | TRANSDERMAL | Status: DC | PRN

## 2017-04-04 MED ORDER — SODIUM CHLORIDE 0.9 % IV SOLN: 250.0000 mL | INTRAVENOUS | Status: DC | PRN

## 2017-04-04 MED ORDER — BUPIVACAINE-EPINEPHRINE 0.25% -1:200000 IJ SOLN
INTRAMUSCULAR | Status: DC | PRN
  Administered 2017-04-04: 12 mL

## 2017-04-04 MED ORDER — ONDANSETRON HCL 4 MG/2ML IJ SOLN
INTRAMUSCULAR | Status: AC
  Filled 2017-04-04: qty 2

## 2017-04-04 MED ORDER — EPHEDRINE 5 MG/ML INJ
INTRAVENOUS | Status: AC
  Filled 2017-04-04: qty 10

## 2017-04-04 MED ORDER — PHENYLEPHRINE HCL 10 MG/ML IJ SOLN
INTRAMUSCULAR | Status: DC | PRN
  Administered 2017-04-04: 80 ug via INTRAVENOUS

## 2017-04-04 MED ORDER — HYDROMORPHONE HCL 1 MG/ML IJ SOLN: 0.2500 mg | INTRAMUSCULAR | Status: DC | PRN

## 2017-04-04 MED ORDER — PROMETHAZINE HCL 25 MG/ML IJ SOLN: 6.2500 mg | INTRAMUSCULAR | Status: DC | PRN

## 2017-04-04 MED ORDER — MIDAZOLAM HCL 2 MG/2ML IJ SOLN: 1.0000 mg | INTRAMUSCULAR | Status: DC | PRN

## 2017-04-04 MED ORDER — ACETAMINOPHEN 325 MG PO TABS: 650.0000 mg | ORAL_TABLET | ORAL | Status: DC | PRN

## 2017-04-04 MED ORDER — PROPOFOL 10 MG/ML IV BOLUS
INTRAVENOUS | Status: DC | PRN
  Administered 2017-04-04: 200 mg via INTRAVENOUS

## 2017-04-04 MED ORDER — SUCCINYLCHOLINE CHLORIDE 200 MG/10ML IV SOSY
PREFILLED_SYRINGE | INTRAVENOUS | Status: AC
  Filled 2017-04-04: qty 10

## 2017-04-04 SURGICAL SUPPLY — 64 items
BENZOIN TINCTURE PRP APPL 2/3 (GAUZE/BANDAGES/DRESSINGS) IMPLANT
BLADE CLIPPER SURG (BLADE) IMPLANT
BLADE SURG 15 STRL LF DISP TIS (BLADE) ×1 IMPLANT
BLADE SURG 15 STRL SS (BLADE) ×2
BNDG CONFORM 2 STRL LF (GAUZE/BANDAGES/DRESSINGS) IMPLANT
BNDG ELASTIC 2X5.8 VLCR STR LF (GAUZE/BANDAGES/DRESSINGS) IMPLANT
CANISTER SUCT 1200ML W/VALVE (MISCELLANEOUS) IMPLANT
CHLORAPREP W/TINT 26ML (MISCELLANEOUS) IMPLANT
CLEANER CAUTERY TIP 5X5 PAD (MISCELLANEOUS) IMPLANT
CLOSURE WOUND 1/2 X4 (GAUZE/BANDAGES/DRESSINGS)
CORD BIPOLAR FORCEPS 12FT (ELECTRODE) IMPLANT
COVER BACK TABLE 60X90IN (DRAPES) ×3 IMPLANT
COVER MAYO STAND STRL (DRAPES) ×3 IMPLANT
DECANTER SPIKE VIAL GLASS SM (MISCELLANEOUS) IMPLANT
DERMABOND ADVANCED (GAUZE/BANDAGES/DRESSINGS) ×2
DERMABOND ADVANCED .7 DNX12 (GAUZE/BANDAGES/DRESSINGS) ×1 IMPLANT
DRAPE LAPAROTOMY 100X72 PEDS (DRAPES) IMPLANT
DRAPE U-SHAPE 76X120 STRL (DRAPES) ×3 IMPLANT
DRSG TEGADERM 2-3/8X2-3/4 SM (GAUZE/BANDAGES/DRESSINGS) IMPLANT
DRSG TEGADERM 4X4.75 (GAUZE/BANDAGES/DRESSINGS) IMPLANT
ELECT COATED BLADE 2.86 ST (ELECTRODE) IMPLANT
ELECT NEEDLE BLADE 2-5/6 (NEEDLE) IMPLANT
ELECT REM PT RETURN 9FT ADLT (ELECTROSURGICAL)
ELECT REM PT RETURN 9FT PED (ELECTROSURGICAL)
ELECTRODE REM PT RETRN 9FT PED (ELECTROSURGICAL) IMPLANT
ELECTRODE REM PT RTRN 9FT ADLT (ELECTROSURGICAL) IMPLANT
GAUZE SPONGE 4X4 12PLY STRL LF (GAUZE/BANDAGES/DRESSINGS) IMPLANT
GLOVE BIO SURGEON STRL SZ 6.5 (GLOVE) ×4 IMPLANT
GLOVE BIO SURGEONS STRL SZ 6.5 (GLOVE) ×2
GOWN STRL REUS W/ TWL LRG LVL3 (GOWN DISPOSABLE) ×2 IMPLANT
GOWN STRL REUS W/TWL LRG LVL3 (GOWN DISPOSABLE) ×4
NEEDLE HYPO 30GX1 BEV (NEEDLE) IMPLANT
NEEDLE PRECISIONGLIDE 27X1.5 (NEEDLE) ×3 IMPLANT
NS IRRIG 1000ML POUR BTL (IV SOLUTION) IMPLANT
PACK BASIN DAY SURGERY FS (CUSTOM PROCEDURE TRAY) ×3 IMPLANT
PAD CLEANER CAUTERY TIP 5X5 (MISCELLANEOUS)
PENCIL BUTTON HOLSTER BLD 10FT (ELECTRODE) IMPLANT
RUBBERBAND STERILE (MISCELLANEOUS) IMPLANT
SHEET MEDIUM DRAPE 40X70 STRL (DRAPES) IMPLANT
SLEEVE SCD COMPRESS KNEE MED (MISCELLANEOUS) IMPLANT
SPONGE GAUZE 2X2 8PLY STER LF (GAUZE/BANDAGES/DRESSINGS)
SPONGE GAUZE 2X2 8PLY STRL LF (GAUZE/BANDAGES/DRESSINGS) IMPLANT
STRIP CLOSURE SKIN 1/2X4 (GAUZE/BANDAGES/DRESSINGS) IMPLANT
SUCTION FRAZIER HANDLE 10FR (MISCELLANEOUS)
SUCTION TUBE FRAZIER 10FR DISP (MISCELLANEOUS) IMPLANT
SUT MNCRL 6-0 UNDY P1 1X18 (SUTURE) IMPLANT
SUT MNCRL AB 3-0 PS2 18 (SUTURE) IMPLANT
SUT MNCRL AB 4-0 PS2 18 (SUTURE) IMPLANT
SUT MON AB 5-0 P3 18 (SUTURE) IMPLANT
SUT MON AB 5-0 PS2 18 (SUTURE) IMPLANT
SUT MONOCRYL 6-0 P1 1X18 (SUTURE)
SUT PROLENE 5 0 P 3 (SUTURE) IMPLANT
SUT PROLENE 5 0 PS 2 (SUTURE) ×3 IMPLANT
SUT PROLENE 6 0 P 1 18 (SUTURE) IMPLANT
SUT VIC AB 5-0 P-3 18X BRD (SUTURE) IMPLANT
SUT VIC AB 5-0 P3 18 (SUTURE)
SUT VIC AB 5-0 PS2 18 (SUTURE) IMPLANT
SUT VICRYL 4-0 PS2 18IN ABS (SUTURE) IMPLANT
SYR BULB 3OZ (MISCELLANEOUS) IMPLANT
SYR CONTROL 10ML LL (SYRINGE) ×3 IMPLANT
TOWEL OR 17X24 6PK STRL BLUE (TOWEL DISPOSABLE) ×3 IMPLANT
TRAY DSU PREP LF (CUSTOM PROCEDURE TRAY) ×3 IMPLANT
TUBE CONNECTING 20'X1/4 (TUBING)
TUBE CONNECTING 20X1/4 (TUBING) IMPLANT

## 2017-04-04 NOTE — Discharge Instructions (Signed)
Excision of Skin Lesions Excision of a skin lesion refers to the removal of a section of skin by making small cuts (incisions) in the skin. This procedure may be done to remove a cancerous (malignant) or noncancerous (benign) growth on the skin. It is typically done to treat or prevent cancer or infection. It may also be done to improve cosmetic appearance. The procedure may be done to remove:  Cancerous growths, such as basal cell carcinoma, squamous cell carcinoma, or melanoma.  Noncancerous growths, such as a cyst or lipoma.  Growths, such as moles or skin tags, which may be removed for cosmetic reasons.  Various excision or surgical techniques may be used depending on your condition, the location of the lesion, and your overall health. Tell a health care provider about:  Any allergies you have.  All medicines you are taking, including vitamins, herbs, eye drops, creams, and over-the-counter medicines.  Any problems you or family members have had with anesthetic medicines.  Any blood disorders you have.  Any surgeries you have had.  Any medical conditions you have.  Whether you are pregnant or may be pregnant. What are the risks? Generally, this is a safe procedure. However, problems may occur, including:  Bleeding.  Infection.  Scarring.  Recurrence of the cyst, lipoma, or cancer.  Changes in skin sensation or appearance, such as discoloration or swelling.  Reaction to the anesthetics.  Allergic reaction to surgical materials or ointments.  Damage to nerves, blood vessels, muscles, or other structures.  Continued pain.  What happens before the procedure?  Ask your health care provider about: ? Changing or stopping your regular medicines. This is especially important if you are taking diabetes medicines or blood thinners. ? Taking medicines such as aspirin and ibuprofen. These medicines can thin your blood. Do not take these medicines before your procedure if  your health care provider instructs you not to.  You may be asked to take certain medicines.  You may be asked to stop smoking.  You may have an exam or testing.  Plan to have someone take you home after the procedure.  Plan to have someone help you with activities during recovery. What happens during the procedure?  To reduce your risk of infection: ? Your health care team will wash or sanitize their hands. ? Your skin will be washed with soap.  You will be given a medicine to numb the area (local anesthetic).  One of the following excision techniques will be performed.  At the end of any of these procedures, antibiotic ointment will be applied as needed. Each of the following techniques may vary among health care providers and hospitals. Complete Surgical Excision The area of skin that needs to be removed will be marked with a pen. Using a small scalpel or scissors, the surgeon will gently cut around and under the lesion until it is completely removed. The lesion will be placed in a fluid and sent to the lab for examination. If necessary, bleeding will be controlled with a device that delivers heat (electrocautery). The edges of the wound may be stitched (sutured) together, and a bandage (dressing) will be applied. This procedure may be performed to treat a cancerous growth or a noncancerous cyst or lesion. Excision of a Cyst The surgeon will make an incision on the cyst. The entire cyst will be removed through the incision. The incision may be closed with sutures. Shave Excision During shave excision, the surgeon will use a small blade or an electrically  heated loop instrument to shave off the lesion. This may be done to remove a mole or a skin tag. The wound will usually be left to heal on its own without sutures. Punch Excision During punch excision, the surgeon will use a small tool that is like a cookie cutter or a hole punch to cut a circle shape out of the skin. The outer edges  of the skin will be sutured together. This may be done to remove a mole or a scar or to perform a biopsy of the lesion. Mohs Micrographic Surgery During Mohs micrographic surgery, layers of the lesion will be removed with a scalpel or a loop instrument and will be examined right away under a microscope. Layers will be removed until all of the abnormal or cancerous tissue has been removed. This procedure is minimally invasive, and it ensures the best cosmetic outcome. It involves the removal of as little normal tissue as possible. Mohs is usually done to treat skin cancer, such as basal cell carcinoma or squamous cell carcinoma, particularly on the face and ears. Depending on the size of the surgical wound, it may be sutured closed. What happens after the procedure?  Return to your normal activities as told by your health care provider.  Talk with your health care provider to discuss any test results, treatment options, and if necessary, the need for more tests. This information is not intended to replace advice given to you by your health care provider. Make sure you discuss any questions you have with your health care provider. Document Released: 10/03/2009 Document Revised: 12/15/2015 Document Reviewed: 08/25/2014 Elsevier Interactive Patient Education  2018 [employer]. May shower tomorrow.      Post Anesthesia Home Care Instructions  Activity: Get plenty of rest for the remainder of the day. A responsible individual must stay with you for 24 hours following the procedure.  For the next 24 hours, DO NOT: -Drive a car -Paediatric nurse -Drink alcoholic beverages -Take any medication unless instructed by your physician -Make any legal decisions or sign important papers.  Meals: Start with liquid foods such as gelatin or soup. Progress to regular foods as tolerated. Avoid greasy, spicy, heavy foods. If nausea and/or vomiting occur, drink only clear liquids until the nausea and/or  vomiting subsides. Call your physician if vomiting continues.  Special Instructions/Symptoms: Your throat may feel dry or sore from the anesthesia or the breathing tube placed in your throat during surgery. If this causes discomfort, gargle with warm salt water. The discomfort should disappear within 24 hours.  If you had a scopolamine patch placed behind your ear for the management of post- operative nausea and/or vomiting:  1. The medication in the patch is effective for 72 hours, after which it should be removed.  Wrap patch in a tissue and discard in the trash. Wash hands thoroughly with soap and water. 2. You may remove the patch earlier than 72 hours if you experience unpleasant side effects which may include dry mouth, dizziness or visual disturbances. 3. Avoid touching the patch. Wash your hands with soap and water after contact with the patch.   Post Anesthesia Home Care Instructions  Activity: Get plenty of rest for the remainder of the day. A responsible individual must stay with you for 24 hours following the procedure.  For the next 24 hours, DO NOT: -Drive a car -Paediatric nurse -Drink alcoholic beverages -Take any medication unless instructed by your physician -Make any legal decisions or sign important papers.  Meals: Start with liquid foods such as gelatin or soup. Progress to regular foods as tolerated. Avoid greasy, spicy, heavy foods. If nausea and/or vomiting occur, drink only clear liquids until the nausea and/or vomiting subsides. Call your physician if vomiting continues.  Special Instructions/Symptoms: Your throat may feel dry or sore from the anesthesia or the breathing tube placed in your throat during surgery. If this causes discomfort, gargle with warm salt water. The discomfort should disappear within 24 hours.  If you had a scopolamine patch placed behind your ear for the management of post- operative nausea and/or vomiting:  1. The medication in the  patch is effective for 72 hours, after which it should be removed.  Wrap patch in a tissue and discard in the trash. Wash hands thoroughly with soap and water. 2. You may remove the patch earlier than 72 hours if you experience unpleasant side effects which may include dry mouth, dizziness or visual disturbances. 3. Avoid touching the patch. Wash your hands with soap and water after contact with the patch.    Post Anesthesia Home Care Instructions  Activity: Get plenty of rest for the remainder of the day. A responsible individual must stay with you for 24 hours following the procedure.  For the next 24 hours, DO NOT: -Drive a car -Paediatric nurse -Drink alcoholic beverages -Take any medication unless instructed by your physician -Make any legal decisions or sign important papers.  Meals: Start with liquid foods such as gelatin or soup. Progress to regular foods as tolerated. Avoid greasy, spicy, heavy foods. If nausea and/or vomiting occur, drink only clear liquids until the nausea and/or vomiting subsides. Call your physician if vomiting continues.  Special Instructions/Symptoms: Your throat may feel dry or sore from the anesthesia or the breathing tube placed in your throat during surgery. If this causes discomfort, gargle with warm salt water. The discomfort should disappear within 24 hours.  If you had a scopolamine patch placed behind your ear for the management of post- operative nausea and/or vomiting:  1. The medication in the patch is effective for 72 hours, after which it should be removed.  Wrap patch in a tissue and discard in the trash. Wash hands thoroughly with soap and water. 2. You may remove the patch earlier than 72 hours if you experience unpleasant side effects which may include dry mouth, dizziness or visual disturbances. 3. Avoid touching the patch. Wash your hands with soap and water after contact with the patch.

## 2017-04-04 NOTE — H&P (Signed)
Cameron Sharp is an 81 y.o. male.   Chief Complaint: extramammary pagets HPI: The patient is a 81 y.o. 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  The path showed negative margins but close at the 9 o'clock position at 1 mm and the plan is for further excision. He is chronically anticoagulated due to chronic a fib, but is able to come off his warfarin for brief peri-operative periods without bridging per cardiology.  Past Medical History:  Diagnosis Date  . Arthritis    back and neck  . Atrial fibrillation ([place]) 10/10/2012  . Chronic back pain   . Gout   . HTN (hypertension) 05/01/2013  . Hyperlipidemia   . Hypertension   . OSA on CPAP    wears CPAP nightly  . Pacemaker   . Pacemaker -[place] 2010 05/01/2013  . Permanent atrial fibrillation [facility])     Past Surgical History:  Procedure Laterality Date  . carpel tunnel surgery Right 2012  . CHOLECYSTECTOMY    . COLONOSCOPY    . GALLBLADDER SURGERY  12/2006  . LIPOMA EXCISION N/A 11/01/2016   Procedure: EXCISION OF SEBACEOUS CYST ON BACK;  Surgeon: [name], DO;  Location: [place];  Service: Plastics;  Laterality: N/A;  . MASS EXCISION N/A 11/01/2016   Procedure: EXCISION OF RIGHT GROIN SKIN LESION;  Surgeon: [name], DO;  Location: [place];  Service: Plastics;  Laterality: N/A;  . MASS EXCISION Right 11/15/2016   Procedure: EXCISION POSITIVE MARGIN RIGHT GROIN;  Surgeon: [name], DO;  Location: [place];  Service: Plastics;  Laterality: Right;  . NM MYOCAR PERF WALL MOTION  05/22/07   no significant ischemia  . PERMANENT PACEMAKER INSERTION  01/09/2009   [facility]  . TONSILLECTOMY     age 50  . US ECHOCARDIOGRAPHY  11/24/07   mild LVH,LA & RA mod to severely dilated,mild mitral annular ca+,mild TR,mild Pulmonary hypertensio,AOV mod. sclerotic,mild AI  w/root dilatation and ca+.    Family History  Problem Relation Age of Onset  . Cancer Father    Social History:  reports that he quit smoking about 40 years ago. He has never used smokeless tobacco. He reports that he drinks alcohol. He reports that he does not use drugs.  Allergies: No Known Allergies  No prescriptions prior to admission.    Results for orders placed or performed during the hospital encounter of 04/04/17 (from the past 48 hour(s))  Basic metabolic panel     Status: Abnormal   Collection Time: 04/03/17  9:21 AM  Result Value Ref Range   Sodium 138 135 - 145 mmol/L   Potassium 4.6 3.5 - 5.1 mmol/L   Chloride 108 101 - 111 mmol/L   CO2 26 22 - 32 mmol/L   Glucose, Bld 96 65 - 99 mg/dL   BUN 19 6 - 20 mg/dL   Creatinine, Ser 0.99 0.61 - 1.24 mg/dL   Calcium 9.2 8.9 - 10.3 mg/dL   GFR calc non Af Amer >60 >60 mL/min   GFR calc Af Amer >60 >60 mL/min    Comment: (NOTE) The eGFR has been calculated using the CKD EPI equation. This calculation has not been validated in all clinical situations. eGFR's persistently <60 mL/min signify possible Chronic Kidney Disease.    Anion gap 4 (L) 5 - 15  PT- INR at PAT visit (Pre-admission Testing)  Status: None   Collection Time: 04/03/17  9:21 AM  Result Value Ref Range   Prothrombin Time 14.5 11.4 - 15.2 seconds   INR 1.13    No results found.  Review of Systems  Constitutional: Negative.   HENT: Negative.   Eyes: Negative.   Respiratory: Negative.   Cardiovascular: Negative.   Gastrointestinal: Negative.   Genitourinary: Negative.   Musculoskeletal: Negative.   Skin: Negative.   Neurological: Negative.   Psychiatric/Behavioral: Negative.     Height '6\' 3"'  (1.905 m), weight 120.2 kg (265 lb). Physical Exam  Constitutional: He is oriented to person, place, and time. He appears well-developed and well-nourished.  HENT:  Head: Normocephalic and atraumatic.  Eyes: Pupils are equal, round, and reactive to  light. EOM are normal.  Cardiovascular: Normal rate.   Respiratory: Effort normal.  GI: Soft.  Neurological: He is alert and oriented to person, place, and time.  Skin: Skin is warm.  Psychiatric: He has a normal mood and affect. His behavior is normal. Judgment and thought content normal.     Assessment/Plan Plan re-excision of right groin extra mammary Paget's disease.  The risks that can be encountered with and after excision of a skin lesion were discussed and include the following but not limited to these: bleeding, infection, delayed healing, anesthesia risks, skin sensation changes, injury to structures including nerves, blood vessels, and muscles which may be temporary or permanent, allergies to tape, suture materials and glues, blood products, topical preparations or injected agents, skin contour irregularities, skin discoloration and swelling, deep vein thrombosis, cardiac and pulmonary complications, pain, which may persist, persistent pain, recurrence of the lesion, poor healing of the incision, possible need for revisional surgery or staged procedures. The patient is going to stop his warfarin 5 days prior to the planned procedure.   [name], DO 04/04/2017, 7:15 AM

## 2017-04-04 NOTE — Op Note (Signed)
DATE OF OPERATION: 04/04/2017  LOCATION: [facility]  PREOPERATIVE DIAGNOSIS: Extramammary paget's right groin  POSTOPERATIVE DIAGNOSIS: Same  PROCEDURE: Re-excision of extramammary paget's right groin 1 x 8 cm  SURGEON: Brittany Osier Sanger Meldrick Buttery, DO  ASSISTANT: Shawn Rayburn, PA  EBL: none CONDITION: Stable  COMPLICATIONS: None  INDICATION: The patient, Cameron Sharp, is a 81 y.o. male born on [birth year], is here for treatment close margin on previous extramammary paget's disease of right groin.   PROCEDURE DETAILS:  The patient was seen prior to surgery and marked.  The IV antibiotics were given. The patient was taken to the operating room and given a general anesthetic. A standard time out was performed and all information was confirmed by those in the room. SCDs were placed.   The area was marked.  The patient was prepped and draped in the usual sterile fashion.  The are was then injected with local for intraoperative hemostasis and postoperative pain control.  The #15 blade was used to excise the 1 x 8 cm area of skin that included all of the previous vertical scar.  The long stitch was placed at the specimen 12 o'clock position and short double stitch at the 3 o'clock position.  Hemostasis was achieved with electrocautery.  The deep layers were closed with 4-0 Monocryl.  The skin edges were closed with a running 5-0 Monocryl.  Derma bond was applied.  Sterile dressing applied. The patient was allowed to wake up and taken to recovery room in stable condition at the end of the case. The family was notified at the end of the case.

## 2017-04-04 NOTE — Anesthesia Postprocedure Evaluation (Signed)
Anesthesia Post Note  Patient: Manoah Deckard  Procedure(s) Performed: Procedure(s) (LRB): REEXCISION OF EXTRA MAMARY PAGETS SKIN DISEASE OF RIGHT GROIN (Right)     Patient location during evaluation: PACU Anesthesia Type: General Level of consciousness: sedated Pain management: pain level controlled Vital Signs Assessment: post-procedure vital signs reviewed and stable Respiratory status: spontaneous breathing and respiratory function stable Cardiovascular status: stable Postop Assessment: no signs of nausea or vomiting Anesthetic complications: no    Last Vitals:  Vitals:   04/04/17 1043 04/04/17 1044  BP: (!) 115/58   Pulse: (!) 59 (!) 59  Resp: 13 10  Temp:    SpO2: 95% 97%    Last Pain:  Vitals:   04/04/17 1030  TempSrc:   PainSc: 3                  Poetry Cerro DANIEL

## 2017-04-04 NOTE — Transfer of Care (Signed)
Immediate Anesthesia Transfer of Care Note  Patient: Cameron Sharp  Procedure(s) Performed: Procedure(s): REEXCISION OF EXTRA MAMARY PAGETS SKIN DISEASE OF RIGHT GROIN (Right)  Patient Location: PACU  Anesthesia Type:General  Level of Consciousness: awake, alert  and oriented  Airway & Oxygen Therapy: Patient Spontanous Breathing and Patient connected to face mask oxygen  Post-op Assessment: Report given to RN and Post -op Vital signs reviewed and stable  Post vital signs: Reviewed and stable  Last Vitals:  Vitals:   04/04/17 0803  BP: (!) 127/51  Pulse: 64  Resp: 20  Temp: 36.6 C  SpO2: 100%    Last Pain:  Vitals:   04/04/17 0803  TempSrc: Oral         Complications: No apparent anesthesia complications

## 2017-04-04 NOTE — Anesthesia Preprocedure Evaluation (Signed)
Anesthesia Evaluation  Patient identified by MRN, date of birth, ID band Patient awake    Reviewed: Allergy & Precautions, NPO status , Patient's Chart, lab work & pertinent test results  History of Anesthesia Complications Negative for: history of anesthetic complications  Airway Mallampati: II  TM Distance: >3 FB Neck ROM: Full    Dental no notable dental hx. (+) Dental Advisory Given   Pulmonary sleep apnea and Continuous Positive Airway Pressure Ventilation , former smoker,    breath sounds clear to auscultation       Cardiovascular hypertension, + Peripheral Vascular Disease  + dysrhythmias Atrial Fibrillation + pacemaker  Rhythm:Irregular Rate:Normal     Neuro/Psych    GI/Hepatic negative GI ROS, Neg liver ROS,   Endo/Other  negative endocrine ROS  Renal/GU negative Renal ROS     Musculoskeletal  (+) Arthritis ,   Abdominal   Peds  Hematology   Anesthesia Other Findings   Reproductive/Obstetrics                             Anesthesia Physical  Anesthesia Plan  ASA: III  Anesthesia Plan: General   Post-op Pain Management:    Induction: Intravenous  PONV Risk Score and Plan:   Airway Management Planned: LMA  Additional Equipment:   Intra-op Plan:   Post-operative Plan: Extubation in OR  Informed Consent: I have reviewed the patients History and Physical, chart, labs and discussed the procedure including the risks, benefits and alternatives for the proposed anesthesia with the patient or authorized representative who has indicated his/her understanding and acceptance.   Dental advisory given  Plan Discussed with: CRNA and Anesthesiologist  Anesthesia Plan Comments:         Anesthesia Quick Evaluation

## 2017-04-04 NOTE — Interval H&P Note (Signed)
History and Physical Interval Note:  04/04/2017 8:51 AM  Cameron Sharp  has presented today for surgery, with the diagnosis of PAGETS DISEASE  The various methods of treatment have been discussed with the patient and family. After consideration of risks, benefits and other options for treatment, the patient has consented to  Procedure(s): REEXCISION OF EXTRA [name] (Right) as a surgical intervention .  The patient's history has been reviewed, patient examined, no change in status, stable for surgery.  I have reviewed the patient's chart and labs.  Questions were answered to the patient's satisfaction.     [name]

## 2017-04-04 NOTE — Anesthesia Procedure Notes (Signed)
Procedure Name: LMA Insertion Date/Time: 04/04/2017 9:25 AM Performed by: Melynda Ripple D Pre-anesthesia Checklist: Patient identified, Emergency Drugs available, Suction available and Patient being monitored Patient Re-evaluated:Patient Re-evaluated prior to induction Oxygen Delivery Method: Circle system utilized Preoxygenation: Pre-oxygenation with 100% oxygen Induction Type: IV induction Ventilation: Mask ventilation without difficulty LMA: LMA inserted LMA Size: 5.0 Number of attempts: 1 Airway Equipment and Method: Bite block Placement Confirmation: positive ETCO2 Tube secured with: Tape Dental Injury: Teeth and Oropharynx as per pre-operative assessment

## 2017-04-05 ENCOUNTER — Encounter (HOSPITAL_BASED_OUTPATIENT_CLINIC_OR_DEPARTMENT_OTHER): Payer: Self-pay | Admitting: Plastic Surgery

## 2017-04-05 NOTE — Addendum Note (Signed)
Addendum  created 04/05/17 0826 by Tawni Millers, CRNA   Charge Capture section accepted

## 2017-04-05 NOTE — Addendum Note (Signed)
Addendum  created 04/05/17 0756 by Willa Frater, CRNA   Charge Capture section accepted

## 2017-04-11 DIAGNOSIS — Z79891 Long term (current) use of opiate analgesic: Secondary | ICD-10-CM | POA: Diagnosis not present

## 2017-04-11 DIAGNOSIS — Z79899 Other long term (current) drug therapy: Secondary | ICD-10-CM | POA: Diagnosis not present

## 2017-04-11 DIAGNOSIS — G894 Chronic pain syndrome: Secondary | ICD-10-CM | POA: Diagnosis not present

## 2017-04-11 DIAGNOSIS — M47816 Spondylosis without myelopathy or radiculopathy, lumbar region: Secondary | ICD-10-CM | POA: Diagnosis not present

## 2017-04-11 DIAGNOSIS — M549 Dorsalgia, unspecified: Secondary | ICD-10-CM | POA: Diagnosis not present

## 2017-04-12 ENCOUNTER — Encounter: Payer: Self-pay | Admitting: Cardiovascular Disease

## 2017-04-12 ENCOUNTER — Ambulatory Visit
Admission: RE | Admit: 2017-04-12 | Discharge: 2017-04-12 | Disposition: A | Discharge status: Home / Self Care | Payer: Medicare Other | Source: Ambulatory Visit | Attending: Physician Assistant | Admitting: Physician Assistant

## 2017-04-12 ENCOUNTER — Ambulatory Visit (INDEPENDENT_AMBULATORY_CARE_PROVIDER_SITE_OTHER): Payer: Medicare Other | Admitting: Cardiovascular Disease

## 2017-04-12 ENCOUNTER — Other Ambulatory Visit: Payer: Self-pay | Admitting: Physician Assistant

## 2017-04-12 VITALS — BP 117/69 | HR 61 | Ht 75.0 in | Wt 264.0 lb

## 2017-04-12 DIAGNOSIS — I4821 Permanent atrial fibrillation: Secondary | ICD-10-CM

## 2017-04-12 DIAGNOSIS — I482 Chronic atrial fibrillation: Secondary | ICD-10-CM

## 2017-04-12 DIAGNOSIS — I10 Essential (primary) hypertension: Secondary | ICD-10-CM | POA: Diagnosis not present

## 2017-04-12 DIAGNOSIS — Z7901 Long term (current) use of anticoagulants: Secondary | ICD-10-CM

## 2017-04-12 DIAGNOSIS — C4499 Other specified malignant neoplasm of skin, unspecified: Secondary | ICD-10-CM

## 2017-04-12 DIAGNOSIS — E669 Obesity, unspecified: Secondary | ICD-10-CM | POA: Diagnosis not present

## 2017-04-12 DIAGNOSIS — M47816 Spondylosis without myelopathy or radiculopathy, lumbar region: Secondary | ICD-10-CM

## 2017-04-12 DIAGNOSIS — Z950 Presence of cardiac pacemaker: Secondary | ICD-10-CM | POA: Diagnosis not present

## 2017-04-12 DIAGNOSIS — G4733 Obstructive sleep apnea (adult) (pediatric): Secondary | ICD-10-CM | POA: Diagnosis not present

## 2017-04-12 DIAGNOSIS — Z9989 Dependence on other enabling machines and devices: Secondary | ICD-10-CM | POA: Diagnosis not present

## 2017-04-12 NOTE — Progress Notes (Signed)
Patient ID: Cameron Sharp, male   DOB: [birth year], 81 y.o.   MRN: [id number]    Primary M.D.: Dr. [name] Pacemaker M.D.: Dr. Sallyanne Kuster  HPI: Cameron Sharp is a 81 y.o. male who presents to the office today for an 12 month follow up cardiology evaluation.  Cameron Sharp has a history of permanent atrial fibrillation and is on chronic Coumadin therapy.  He does home testing of his Coumadin.  He also has a history of hypertension, hyperlipidemia, obstructive sleep apnea on CPAP therapy, and obesity.  In July 2010 he underwent insertion of a [employer] Zephyr single-chamber pacemaker.  He sees Dr. Sallyanne Kuster for pacemaker followup evaluation.  Cameron. Cameron Sharp has had difficulty with weight fluctuation with weights ranging from 344 pounds to 271 pounds.  In April 2013 his weight had reduced to 271 pounds.  Since that time, his weight has gradually increased to approximately 335 pounds.  He has had difficulty with lower back degenerative disc disease, which has limited some of his previous ambulation.    He has obstructive sleep apnea.  His CPAP unit is set at 14 cm water pressure.  He admits to 100% compliance with CPAP therapy.  His sleep duration is typically greater than 8 hours per night.  He never sleeps without it.  He denies breakthrough snoring.  His sleep is restorative.  He denies excessive daytime sleepiness.  There is no bruxism.  There is no restless legs.    A seven-year follow-up echo Doppler study on 10/11/2014 showed an ejection fraction at 55-60%.  There was moderate aortic sclerosis without stenosis with mild aortic insufficiency.  There was mild mitral regurgitation.  There was biatrial enlargement, left greater than right.  There was trivial pulmonic insufficiency.  Pulmonary pressures were normal.  When I saw him last year he had spent most of the summer in [place].  However, he developed a swollen right leg and aduplex exam in [place] which did not reveal DVT. He came back to [place].   He has been evaluated by Dr. Oneida Alar and Dr. Donnetta Hutching.  There may had been initial plans for laser therapy.  Ultimately, a conservative approach with Dr. Jess Barters silver compression socks was recommended.   He continues to use CPAP and [employer] is his DME company.  Continues to have difficulty with his degenerative disease of his low back, limiting his ambulation.  He continues with home monitoring of his INR and denies recent bleeding.  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  I've been contacted by his plastic surgeon at [facility].  He had previously undergone colonoscopy and had to hold Coumadin for this procedure.  Subsequently, we deferred his plastic surgery procedure, so that he could get back on Coumadin for several weeks prior to holding again.  Subsequently, he has required 3 and is in need for fourth due to some activity noted at the margins.  He tolerated these procedures well.  He denies any chest pain.  He denies palpitations.  He continues to use compressions socks.  He is not had bleeding.  He continues to use CPAP with 100% compliance.  Several weeks ago.  He saw Dr. Sallyanne Kuster.  He has normal pacemaker device function with estimated generator longevity at 4.5-6.25 years.  He presents for evaluation.  Past Medical History:  Diagnosis Date  . Arthritis    back and neck  . Atrial fibrillation ([place]) 10/10/2012  . Chronic back pain   .  Gout   . HTN (hypertension) 05/01/2013  . Hyperlipidemia   . Hypertension   . OSA on CPAP    wears CPAP nightly  . Pacemaker   . Pacemaker -[place] 2010 05/01/2013  . Permanent atrial fibrillation [facility])     Past Surgical History:  Procedure Laterality Date  . carpel tunnel surgery Right 2012  . CHOLECYSTECTOMY    . COLONOSCOPY    . GALLBLADDER SURGERY  12/2006  . LIPOMA EXCISION N/A 11/01/2016   Procedure: EXCISION OF SEBACEOUS CYST ON BACK;  Surgeon: [name], DO;  Location: [place];  Service: Plastics;  Laterality: N/A;  . MASS EXCISION N/A 11/01/2016   Procedure: EXCISION OF RIGHT GROIN SKIN LESION;  Surgeon: [name], DO;  Location: [place];  Service: Plastics;  Laterality: N/A;  . MASS EXCISION Right 11/15/2016   Procedure: EXCISION POSITIVE MARGIN RIGHT GROIN;  Surgeon: [name], DO;  Location: [place];  Service: Plastics;  Laterality: Right;  . MASS EXCISION Right 04/04/2017   Procedure: REEXCISION OF EXTRA MAMARY PAGETS SKIN DISEASE OF RIGHT GROIN;  Surgeon: [name], DO;  Location: [place];  Service: Plastics;  Laterality: Right;  . NM MYOCAR PERF WALL MOTION  05/22/07   no significant ischemia  . PERMANENT PACEMAKER INSERTION  01/09/2009   [facility]  . TONSILLECTOMY     age 35  . US ECHOCARDIOGRAPHY  11/24/07   mild LVH,LA & RA mod to severely dilated,mild mitral annular ca+,mild TR,mild Pulmonary hypertensio,AOV mod. sclerotic,mild AI w/root dilatation and ca+.    No Known Allergies  Current Outpatient Prescriptions  Medication Sig Dispense Refill  . atorvastatin (LIPITOR) 20 MG tablet Take 1 tablet (20 mg total) by mouth daily. 90 tablet 3  . baclofen (LIORESAL) 10 MG tablet Take 1 tablet by mouth 2 (two) times daily.  0  . COUMADIN 5 MG tablet TAKE 1 TABLET DAILY OR AS DIRECTED 90 tablet 3  . docusate sodium (COLACE) 100 MG capsule Take 100 mg by mouth 2 (two) times daily.    Marland Kitchen gabapentin (NEURONTIN) 300 MG capsule Take 900 mg by mouth daily.     Marland Kitchen HYDROcodone-acetaminophen (NORCO) 5-325 MG tablet Take 1 tablet by mouth every 12 (twelve) hours as needed for moderate pain. 20 tablet 0  . metoprolol tartrate (LOPRESSOR) 25 MG tablet Take 0.5 tablets (12.5 mg total) by mouth every morning. 45 tablet 3  . Thiamine HCl (VITAMIN B-1) 250 MG tablet Take 250 mg by mouth daily.    Marland Kitchen ULORIC 40 MG tablet Take 1 tablet (40 mg total) by  mouth daily. 30 tablet 6  . valsartan-hydrochlorothiazide (DIOVAN-HCT) 160-12.5 MG tablet Take 1 tablet by mouth daily. 90 tablet 3   No current facility-administered medications for this visit.     Social History   Social History  . Marital status: Married    Spouse name: N/A  . Number of children: N/A  . Years of education: N/A   Occupational History  . Not on file.   Social History Main Topics  . Smoking status: Former Smoker    Quit date: 07/23/1976  . Smokeless tobacco: Never Used  . Alcohol use 0.0 oz/week     Comment: 2oz scotch daily  . Drug use: No  . Sexual activity: Not on file   Other Topics Concern  . Not on file   Social History Narrative  . No narrative on file  Family History  Problem Relation Age of Onset  . Cancer Father     ROS General: Positive for weight gain No fevers, chills, or night sweats; Positive for morbid obesity. HEENT: Negative; No changes in vision or hearing, sinus congestion, difficulty swallowing Pulmonary: Negative; No cough, wheezing, shortness of breath, hemoptysis Cardiovascular: Negative; No chest pain, presyncope, syncope, palpatations Positive for mild edema;  Venous insufficiency GI: Negative; No nausea, vomiting, diarrhea, or abdominal pain GU: Negative; No dysuria, hematuria, or difficulty voiding Musculoskeletal: Positive for low back discomfort due to degenerative disc disease;  no myalgias, joint pain, or weakness Hematologic: Negative; no easy bruising, bleeding Endocrine: Negative; no heat/cold intolerance; no diabetes, Neuro: Positive for degenerative disc disease; no changes in balance, headaches Skin: Negative; No rashes or skin lesions Psychiatric: Negative; No behavioral problems, depression Sleep: Positive for obstructive sleep apnea with CPAP therapy and 100% compliance.  No snoring,  daytime sleepiness, hypersomnolence, bruxism, restless legs, hypnogognic hallucinations. Other comprehensive 14 point system  review is negative    Physical Exam BP 117/69   Pulse 61   Ht _0  (1.905 m)   Wt 264 lb (119.7 kg)   BMI 33.00 kg/m    Wt Readings from Last 3 Encounters:  04/12/17 264 lb (119.7 kg)  04/04/17 269 lb (122 kg)  03/27/17 265 lb 4.8 oz (120.3 kg)   General: Alert, oriented, no distress.  Skin: normal turgor, no rashes, warm and dry HEENT: Normocephalic, atraumatic. Pupils equal round and reactive to light; sclera anicteric; extraocular muscles intact;  Nose without nasal septal hypertrophy Mouth/Parynx benign; Mallinpatti scale 3Neck: No JVD, no carotid bruits; normal carotid upstroke Lungs: clear to ausculatation and percussion; no wheezing or rales Chest wall: without tenderness to palpitation Heart: PMI not displaced, irregularly irregular with a controlled ventricular response, s1 s2 normal, 1/6 systolic murmur, no diastolic murmur, no rubs, gallops, thrills, or heaves Abdomen: soft, nontender; no hepatosplenomehaly, BS+; abdominal aorta nontender and not dilated by palpation. Back: no CVA tenderness Pulses 2+ Musculoskeletal: full range of motion, normal strength, no joint deformities Extremities: Trivial ankle edema with stasis changes to his lower extremities.  no clubbing cyanosis, Homan's sign negative  Neurologic: grossly nonfocal; Cranial nerves grossly wnl Psychologic: Normal mood and affect   ECG (independently read by me): Baseline atrial fibrillation;  Frequent ventricular paced rhythm  T-wave abnormality  September 2017 ECG (independently read by me): Ventricular paced rhythm at 60 bpm.   October 2016 ECG (independently read by me):  Underlying atrial fibrillation with appropriate V pacing  April 2016 ECG (independently read by me): Atrial fibrillation with appropriate V pacing and sensing.  January 2016 ECG (independently read by me): Atrial fibrillation at 67 bpm with nonspecific T changes.  Appropriate V pacing and sensing.  Prior ECG (independently read  by me): Underlying atrial fibrillation with appropriate V. pacing and V. sensing.  Ventricular rate 65  LABS:  BMP Latest Ref Rng & Units 04/03/2017 10/30/2016 09/24/2016  Glucose 65 - 99 mg/dL 96 104(H) 108  BUN 6 - 20 mg/dL 19 15 14.5  Creatinine 0.61 - 1.24 mg/dL 0.99 1.00 1.0  Sodium 135 - 145 mmol/L 138 143 141  Potassium 3.5 - 5.1 mmol/L 4.6 5.1 4.2  Chloride 101 - 111 mmol/L 108 105 -  CO2 22 - 32 mmol/L _1 Calcium 8.9 - 10.3 mg/dL 9.2 9.4 10.3     Hepatic Function Latest Ref Rng & Units 10/30/2016 10/25/2014 11/19/2013  Total Protein 6.5 - 8.1 g/dL  6.7 7.2 6.6  Albumin 3.5 - 5.0 g/dL 4.2 4.7 4.2  AST 15 - 41 U/L _0 ALT 17 - 63 U/L 14(L) 14 13  Alk Phosphatase 38 - 126 U/L 49 41 39  Total Bilirubin 0.3 - 1.2 mg/dL 1.0 1.4(H) 0.8    CBC Latest Ref Rng & Units 11/19/2013  WBC 4.0 - 10.5 K/uL 5.9  Hemoglobin 13.0 - 17.0 g/dL 15.2  Hematocrit 39.0 - 52.0 % 44.2  Platelets 150 - 400 K/uL 191   No results found for: TSH  BNP No results found for: PROBNP  Lipid Panel     Component Value Date/Time   CHOL 173 10/25/2014 0838   TRIG 129 10/25/2014 0838   HDL 65 10/25/2014 0838   CHOLHDL 2.7 10/25/2014 0838   VLDL 26 10/25/2014 0838   LDLCALC 82 10/25/2014 0838     RADIOLOGY: Ct Lumbar Spine Wo Contrast  11/10/2013   CLINICAL DATA:  Low back pain.  Left leg weakness.  EXAM: CT LUMBAR SPINE WITHOUT CONTRAST  TECHNIQUE: Multidetector CT imaging of the lumbar spine was performed without intravenous contrast administration. Multiplanar CT image reconstructions were also generated.  COMPARISON:  Lumbar spine radiographs 09/23/2012.  FINDINGS: Mild degenerative scoliosis convex left mid lumbar region. Anatomic alignment except for 2 mm facet mediated slip L5-S1. Advanced disc space narrowing L3-4 and L4-5. No worrisome osseous lesions. Atherosclerosis of the aorta, non aneurysmal. Unremarkable SI joints.  At L1-L2, there is a large extraforaminal spur to the left. There  is some left-sided foraminal narrowing which could affect the L1 nerve root. Slight left lateral recess stenosis without clear-cut L2 nerve root impingement.  At L2-L3, there is mild disc space narrowing. There is moderate facet ligamentum flavum hypertrophy. Mild left and moderate right neural foraminal narrowing could affect the L2 nerve roots. Mild central canal stenosis without definite L3 nerve root impingement.  At L3-L4, there is multifactorial spinal stenosis related to right greater than left disc space narrowing, central protrusion which is partially calcified, osteophytic spurring, as well as posterior element hypertrophy. Bilateral L3 and L4 nerve root impingement are likely, right worse than left.  At L4-5, there is a large calcified right paracentral central disc extrusion associated with moderate to severe disc space narrowing. Bilateral foraminal narrowing is evident. Advanced posterior element hypertrophy. Right greater than left L5 nerve root impingement in the canal with bilateral L4 nerve root impingement in the foramina.  At L5-S1 there is 2 mm of facet mediated anterolisthesis. There is mild annular bulging. Advanced facet overgrowth. Bilateral L5 nerve root impingement is likely due to foraminal narrowing. No definite S1 nerve root compression in the canal.  IMPRESSION: Multilevel spondylosis as described. Potentially symptomatic left-sided neural compression is observed at L1-2, L3-4, L4-5, and L5-S1. See discussion above.  If further investigation is desired, consider CT myelography with flexion extension views.   Electronically Signed   By: Rolla Flatten M.D.   On: 11/10/2013 14:32   IMPRESSION: 1. Permanent atrial fibrillation ([place])   2. Long term current use of anticoagulant therapy   3. Essential hypertension   4. OSA on CPAP   5. Mild obesity   6. Paget disease, extra-mammary   7. Pacemaker -single-chamber [employer] Zephyr 2010      ASSESSMENT AND PLAN: Cameron. Stacey Maura  is a 81 year old gentleman who has a long-standing history of permanent atrial fibrillation, for which he is on Coumadin anticoagulation.  He has a Optometrist,  and this is functioning appropriately.  Recent device interrogation showed that he was 74% pacing and there were no episodes of high ventricular rates.  Estimated generator longevity is 4.5-6.25 years.  He had normal lead parameters.  His blood pressure today is  controlled on combination metoprolol 12.5 mg twice a and valsartan HCT 160/12.5 mg. He continues to tolerate atorvastatin for hyperlipidemia. There are no myalgias.  He has obstructive sleep apnea and continues to be 100% compliant.  He states he cannot sleep without it.  He denies daytime sleepiness.  Her residual snoring.  Sleep is restorative.  He has multilevel spondylosis noted on his CT of his lumbar spine.  His venous insufficiency is controlled with his support stockings.  He is in need for fourth procedure to widen his margins for his extra mammary Paget's disease.  This will be done in several weeks.  He is resuming Coumadin as of today from his last procedure.  He will need to hold Coumadin again for 5 days prior to his next procedure.  He will be having complete blood work from his primary physician.  He has lost significant amount of weight purposefully from a peak weight of 353 down to a present weight of 264.  I will see him in one year for reevaluation or sooner if problems arise. Time spent: 25 minutes  Troy Sine, MD, [facility]  04/14/2017 8:22 AM

## 2017-04-12 NOTE — Patient Instructions (Signed)

## 2017-04-17 ENCOUNTER — Ambulatory Visit (HOSPITAL_BASED_OUTPATIENT_CLINIC_OR_DEPARTMENT_OTHER): Payer: Medicare Other | Admitting: Oncology

## 2017-04-17 VITALS — BP 104/50 | HR 60 | Temp 97.8°F | Resp 18 | Ht 75.0 in | Wt 268.2 lb

## 2017-04-17 DIAGNOSIS — I4891 Unspecified atrial fibrillation: Secondary | ICD-10-CM

## 2017-04-17 DIAGNOSIS — C632 Malignant neoplasm of scrotum: Secondary | ICD-10-CM | POA: Diagnosis not present

## 2017-04-17 NOTE — Progress Notes (Signed)
  [facility] OFFICE PROGRESS NOTE   Diagnosis: Extramammary Paget's disease  INTERVAL HISTORY:   Mr. Dimaria returns for oncology evaluation after undergoing multiple re-excisions of the right groin extramammary Paget's disease. A reexcision procedure 11/15/2016 had a negative, but close margin. He was taken to another reexcision procedure 04/04/2017. The pathology ([id number]) confirmed extramammary Paget's disease with involvement of the 7-8:00 margin.  He reports he is being scheduled for another reexcision.  He had a hematoma after one of the excision procedures. No other complaint.  He reports undergoing a negative colonoscopy by Dr. Earlean Shawl in August.  Objective:  Vital signs in last 24 hours:  Blood pressure (!) 104/50, pulse 60, temperature 97.8 F (36.6 C), temperature source Oral, resp. rate 18, height 6\' 3"  (1.905 m), weight 268 lb 3.2 oz (121.7 kg), SpO2 98 %.    HEENT: Neck without mass Lymphatics: No cervical, supraclavicular, axillary, or inguinal nodes. Soft less than 1 cm subcutaneous nodular lesion that moves over the left clavicle Resp: Lungs clear bilaterally Cardio: Irregular GI: No hepatosplenomegaly Vascular: No leg edema  Skin: Scar in the right groin near the lateral aspect of the scrotum is healed and without evidence of recurrent tumor. No suspicious skin lesion in the right groin.    Medications: I have reviewed the patient's current medications.  Assessment/Plan: 1. Extramammary Paget's disease of the right groin  CTs of the chest, abdomen, and pelvis 09/24/2016-negative for a primary tumor site or metastatic disease, single 5 mm left lower lobe nodule  Excisional biopsy 11/01/2016 confirmed extramammary Paget's disease with positive surgical margins  Reexcision 11/15/2016-extramammary Paget's disease, margins negative, but close  Reexcision 04/04/2017-extramammary Paget disease involving the 7-8:00 margin   2. Atrial  fibrillation  3. Sleep apnea  4. Chronic back pain  5.   5 mm left lower lobe nodule noted on a chest CT 09/24/2016   Disposition:  Mr. Harig appears stable. He has undergone multiple excision procedure for management of extramammary Paget's at the right groin. The most recent excision confirmed residual disease involving one of the surgical margins. He is being scheduled for another excision procedure.  I discussed treatment options with Mr. Acree and his wife. If the next excision reveals residual tumor I recommend a radiation oncology consult.  He will contact me after the upcoming surgery and I will review the surgical pathology. If there is a positive or close margin I will recommend a radiation referral.  He is not scheduled for a follow-up appointment at the [facility]. I am available to see him in the future as needed.    Donneta Romberg, MD  04/17/2017  11:15 AM

## 2017-04-22 ENCOUNTER — Ambulatory Visit (INDEPENDENT_AMBULATORY_CARE_PROVIDER_SITE_OTHER): Payer: Medicare Other | Admitting: Pharmacist

## 2017-04-22 DIAGNOSIS — Z7901 Long term (current) use of anticoagulants: Secondary | ICD-10-CM

## 2017-04-22 DIAGNOSIS — I482 Chronic atrial fibrillation: Secondary | ICD-10-CM

## 2017-04-22 DIAGNOSIS — I4821 Permanent atrial fibrillation: Secondary | ICD-10-CM

## 2017-04-22 LAB — POCT INR: INR: 2

## 2017-04-23 DIAGNOSIS — C44599 Other specified malignant neoplasm of skin of other part of trunk: Secondary | ICD-10-CM | POA: Diagnosis not present

## 2017-05-02 ENCOUNTER — Ambulatory Visit (INDEPENDENT_AMBULATORY_CARE_PROVIDER_SITE_OTHER): Payer: Medicare Other | Admitting: Pharmacist Clinician (PhC)/ Clinical Pharmacy Specialist

## 2017-05-02 DIAGNOSIS — I4821 Permanent atrial fibrillation: Secondary | ICD-10-CM

## 2017-05-02 DIAGNOSIS — I482 Chronic atrial fibrillation: Secondary | ICD-10-CM | POA: Diagnosis not present

## 2017-05-02 DIAGNOSIS — Z7901 Long term (current) use of anticoagulants: Secondary | ICD-10-CM | POA: Diagnosis not present

## 2017-05-02 LAB — POCT INR: INR: 2.4

## 2017-05-03 DIAGNOSIS — Z23 Encounter for immunization: Secondary | ICD-10-CM | POA: Diagnosis not present

## 2017-05-07 ENCOUNTER — Encounter (HOSPITAL_BASED_OUTPATIENT_CLINIC_OR_DEPARTMENT_OTHER): Payer: Self-pay | Admitting: *Deleted

## 2017-05-07 ENCOUNTER — Ambulatory Visit: Payer: Self-pay | Admitting: Plastic Surgery

## 2017-05-07 DIAGNOSIS — C4499 Other specified malignant neoplasm of skin, unspecified: Secondary | ICD-10-CM

## 2017-05-07 NOTE — Progress Notes (Signed)
This is patient's 4th surgery for Paget's disease excision, all done here at [facility]. Patient stopped Coumadin 05-02-17 and lab INR that day was 2.4. He checks INR at home and 05-07-17 it was 1.2. Multiple notes in EPIC form cards that surgery will not interfere with pacemaker function. Last cards note from Dr Claiborne Billings on 04-12-17 on chart.

## 2017-05-07 NOTE — H&P (Signed)
Cameron Sharp is an 81 y.o. male.   Chief Complaint: extramammary paget's disease HPI: The patient is a 81 yrs old male here for excision of the right groin for extra mammary paget's disease.  The path from this excision was positive at 7 to 8 o'clock margins.  History:  He underwent excision of an area of extra mammary paget's disease of the right groin skin and excision of a sebaceous cyst of his right upper back on 11/01/16. Re-excised on 11/15/16 due to positive margins. The follow up path returned close margins and plans were made to re-excise the area again after several months as he needed time for the area to heal and stretch to allow for further excision.    Past Medical History:  Diagnosis Date  . Arthritis    back and neck  . Atrial fibrillation ([place]) 10/10/2012  . Chronic back pain   . Gout   . HTN (hypertension) 05/01/2013  . Hyperlipidemia   . Hypertension   . OSA on CPAP    wears CPAP nightly  . Pacemaker   . Pacemaker -[place] 2010 05/01/2013  . Permanent atrial fibrillation [facility])     Past Surgical History:  Procedure Laterality Date  . carpel tunnel surgery Right 2012  . CHOLECYSTECTOMY    . COLONOSCOPY    . GALLBLADDER SURGERY  12/2006  . LIPOMA EXCISION N/A 11/01/2016   Procedure: EXCISION OF SEBACEOUS CYST ON BACK;  Surgeon: [name], DO;  Location: [place];  Service: Plastics;  Laterality: N/A;  . MASS EXCISION N/A 11/01/2016   Procedure: EXCISION OF RIGHT GROIN SKIN LESION;  Surgeon: [name], DO;  Location: [place];  Service: Plastics;  Laterality: N/A;  . MASS EXCISION Right 11/15/2016   Procedure: EXCISION POSITIVE MARGIN RIGHT GROIN;  Surgeon: [name], DO;  Location: [place];  Service: Plastics;  Laterality: Right;  . MASS EXCISION Right 04/04/2017   Procedure: REEXCISION OF EXTRA MAMARY PAGETS SKIN DISEASE OF RIGHT GROIN;  Surgeon: [name], DO;  Location: [place];  Service: Plastics;  Laterality: Right;  . NM MYOCAR PERF WALL MOTION  05/22/07   no significant ischemia  . PERMANENT PACEMAKER INSERTION  01/09/2009   [facility]  . TONSILLECTOMY     age 37  . US ECHOCARDIOGRAPHY  11/24/07   mild LVH,LA & RA mod to severely dilated,mild mitral annular ca+,mild TR,mild Pulmonary hypertensio,AOV mod. sclerotic,mild AI w/root dilatation and ca+.    Family History  Problem Relation Age of Onset  . Cancer Father    Social History:  reports that he quit smoking about 40 years ago. He has never used smokeless tobacco. He reports that he drinks alcohol. He reports that he does not use drugs.  Allergies: No Known Allergies   (Not in a hospital admission)  No results found for this or any previous visit (from the past 48 hour(s)). No results found.  Review of Systems  Constitutional: Negative.   HENT: Negative.   Eyes: Negative.   Respiratory: Negative.   Cardiovascular: Negative.   Gastrointestinal: Negative.   Genitourinary: Negative.   Musculoskeletal: Negative.   Skin: Negative.   Neurological: Negative.   Psychiatric/Behavioral: Negative.     There were no vitals taken for this visit. Physical Exam  Constitutional: He is oriented to person, place, and time. He appears well-developed and well-nourished.  HENT:  Head: Normocephalic and atraumatic.  Eyes: Pupils are equal,  round, and reactive to light. EOM are normal.  Cardiovascular: Normal rate.   Respiratory: Effort normal.  GI: Soft.  Musculoskeletal:       Legs: Neurological: He is alert and oriented to person, place, and time.  Skin: Skin is warm.  Psychiatric: He has a normal mood and affect. His behavior is normal. Judgment and thought content normal.     Assessment/Plan Excision of right groin extra-mammary paget's disease.  [name], DO 05/07/2017, 2:28 PM

## 2017-05-07 NOTE — Progress Notes (Signed)
Reviewed chart with Dr Tobias Alexander (Coumadin, INR, pacemaker) who says OK for surgery

## 2017-05-08 ENCOUNTER — Ambulatory Visit (HOSPITAL_BASED_OUTPATIENT_CLINIC_OR_DEPARTMENT_OTHER)
Admission: RE | Admit: 2017-05-08 | Discharge: 2017-05-08 | Disposition: A | Discharge status: Home / Self Care | Payer: Medicare Other | Source: Ambulatory Visit | Attending: Plastic Surgery | Admitting: Plastic Surgery

## 2017-05-08 ENCOUNTER — Encounter (HOSPITAL_BASED_OUTPATIENT_CLINIC_OR_DEPARTMENT_OTHER): Payer: Self-pay | Admitting: *Deleted

## 2017-05-08 ENCOUNTER — Ambulatory Visit (HOSPITAL_BASED_OUTPATIENT_CLINIC_OR_DEPARTMENT_OTHER): Payer: Medicare Other | Admitting: Anesthesiology

## 2017-05-08 ENCOUNTER — Encounter (HOSPITAL_BASED_OUTPATIENT_CLINIC_OR_DEPARTMENT_OTHER)
Admission: RE | Disposition: A | Discharge status: Home / Self Care | Payer: Self-pay | Source: Ambulatory Visit | Attending: Plastic Surgery

## 2017-05-08 DIAGNOSIS — L989 Disorder of the skin and subcutaneous tissue, unspecified: Secondary | ICD-10-CM | POA: Diagnosis not present

## 2017-05-08 DIAGNOSIS — C4499 Other specified malignant neoplasm of skin, unspecified: Secondary | ICD-10-CM

## 2017-05-08 DIAGNOSIS — G4733 Obstructive sleep apnea (adult) (pediatric): Secondary | ICD-10-CM | POA: Insufficient documentation

## 2017-05-08 DIAGNOSIS — I739 Peripheral vascular disease, unspecified: Secondary | ICD-10-CM | POA: Diagnosis not present

## 2017-05-08 DIAGNOSIS — Z79899 Other long term (current) drug therapy: Secondary | ICD-10-CM | POA: Diagnosis not present

## 2017-05-08 DIAGNOSIS — M1990 Unspecified osteoarthritis, unspecified site: Secondary | ICD-10-CM | POA: Insufficient documentation

## 2017-05-08 DIAGNOSIS — I4891 Unspecified atrial fibrillation: Secondary | ICD-10-CM | POA: Diagnosis not present

## 2017-05-08 DIAGNOSIS — Z950 Presence of cardiac pacemaker: Secondary | ICD-10-CM | POA: Diagnosis not present

## 2017-05-08 DIAGNOSIS — Z7901 Long term (current) use of anticoagulants: Secondary | ICD-10-CM | POA: Diagnosis not present

## 2017-05-08 DIAGNOSIS — Z87891 Personal history of nicotine dependence: Secondary | ICD-10-CM | POA: Diagnosis not present

## 2017-05-08 DIAGNOSIS — M47819 Spondylosis without myelopathy or radiculopathy, site unspecified: Secondary | ICD-10-CM | POA: Insufficient documentation

## 2017-05-08 DIAGNOSIS — I10 Essential (primary) hypertension: Secondary | ICD-10-CM | POA: Insufficient documentation

## 2017-05-08 DIAGNOSIS — I482 Chronic atrial fibrillation: Secondary | ICD-10-CM | POA: Insufficient documentation

## 2017-05-08 DIAGNOSIS — E785 Hyperlipidemia, unspecified: Secondary | ICD-10-CM | POA: Diagnosis not present

## 2017-05-08 DIAGNOSIS — C44599 Other specified malignant neoplasm of skin of other part of trunk: Secondary | ICD-10-CM | POA: Insufficient documentation

## 2017-05-08 DIAGNOSIS — Z9989 Dependence on other enabling machines and devices: Secondary | ICD-10-CM | POA: Diagnosis not present

## 2017-05-08 DIAGNOSIS — M109 Gout, unspecified: Secondary | ICD-10-CM | POA: Insufficient documentation

## 2017-05-08 DIAGNOSIS — C44511 Basal cell carcinoma of skin of breast: Secondary | ICD-10-CM | POA: Diagnosis not present

## 2017-05-08 HISTORY — PX: CYST EXCISION: SHX5701

## 2017-05-08 LAB — POCT I-STAT, CHEM 8
BUN: 26 mg/dL — ABNORMAL HIGH (ref 6–20)
BUN: 28 mg/dL — ABNORMAL HIGH (ref 6–20)
CALCIUM ION: 1.03 mmol/L — AB (ref 1.15–1.40)
Calcium, Ion: 1.03 mmol/L — ABNORMAL LOW (ref 1.15–1.40)
Chloride: 107 mmol/L (ref 101–111)
Chloride: 107 mmol/L (ref 101–111)
Creatinine, Ser: 0.9 mg/dL (ref 0.61–1.24)
Creatinine, Ser: 0.9 mg/dL (ref 0.61–1.24)
GLUCOSE: 104 mg/dL — AB (ref 65–99)
Glucose, Bld: 97 mg/dL (ref 65–99)
HCT: 42 % (ref 39.0–52.0)
HCT: 45 % (ref 39.0–52.0)
HEMOGLOBIN: 15.3 g/dL (ref 13.0–17.0)
Hemoglobin: 14.3 g/dL (ref 13.0–17.0)
Potassium: 5.5 mmol/L — ABNORMAL HIGH (ref 3.5–5.1)
Potassium: 6.1 mmol/L — ABNORMAL HIGH (ref 3.5–5.1)
SODIUM: 139 mmol/L (ref 135–145)
Sodium: 140 mmol/L (ref 135–145)
TCO2: 26 mmol/L (ref 22–32)
TCO2: 28 mmol/L (ref 22–32)

## 2017-05-08 SURGERY — CYST REMOVAL
Anesthesia: General | Site: Groin | Laterality: Right

## 2017-05-08 MED ORDER — BUPIVACAINE-EPINEPHRINE (PF) 0.25% -1:200000 IJ SOLN
INTRAMUSCULAR | Status: AC
  Filled 2017-05-08: qty 90

## 2017-05-08 MED ORDER — LIDOCAINE-EPINEPHRINE 1 %-1:100000 IJ SOLN
INTRAMUSCULAR | Status: DC | PRN
  Administered 2017-05-08: 10 mL

## 2017-05-08 MED ORDER — LACTATED RINGERS IV SOLN
INTRAVENOUS | Status: DC
  Administered 2017-05-08: 08:00:00 via INTRAVENOUS

## 2017-05-08 MED ORDER — FENTANYL CITRATE (PF) 100 MCG/2ML IJ SOLN
INTRAMUSCULAR | Status: AC
  Filled 2017-05-08: qty 2

## 2017-05-08 MED ORDER — DEXAMETHASONE SODIUM PHOSPHATE 4 MG/ML IJ SOLN
INTRAMUSCULAR | Status: DC | PRN
  Administered 2017-05-08: 8 mg via INTRAVENOUS

## 2017-05-08 MED ORDER — FENTANYL CITRATE (PF) 100 MCG/2ML IJ SOLN
50.0000 ug | INTRAMUSCULAR | Status: DC | PRN
  Administered 2017-05-08: 50 ug via INTRAVENOUS

## 2017-05-08 MED ORDER — DEXAMETHASONE SODIUM PHOSPHATE 10 MG/ML IJ SOLN
INTRAMUSCULAR | Status: AC
  Filled 2017-05-08: qty 1

## 2017-05-08 MED ORDER — CEFAZOLIN SODIUM-DEXTROSE 2-4 GM/100ML-% IV SOLN
INTRAVENOUS | Status: AC
  Filled 2017-05-08: qty 200

## 2017-05-08 MED ORDER — LIDOCAINE-EPINEPHRINE 1 %-1:100000 IJ SOLN
INTRAMUSCULAR | Status: AC
  Filled 2017-05-08: qty 3

## 2017-05-08 MED ORDER — PROPOFOL 500 MG/50ML IV EMUL
INTRAVENOUS | Status: AC
  Filled 2017-05-08: qty 50

## 2017-05-08 MED ORDER — MIDAZOLAM HCL 2 MG/2ML IJ SOLN: 1.0000 mg | INTRAMUSCULAR | Status: DC | PRN

## 2017-05-08 MED ORDER — SUCCINYLCHOLINE CHLORIDE 200 MG/10ML IV SOSY
PREFILLED_SYRINGE | INTRAVENOUS | Status: AC
  Filled 2017-05-08: qty 10

## 2017-05-08 MED ORDER — LIDOCAINE HCL (CARDIAC) 20 MG/ML IV SOLN
INTRAVENOUS | Status: DC | PRN
  Administered 2017-05-08: 100 mg via INTRAVENOUS

## 2017-05-08 MED ORDER — CEFAZOLIN SODIUM-DEXTROSE 2-4 GM/100ML-% IV SOLN
2.0000 g | INTRAVENOUS | Status: AC
  Administered 2017-05-08: 2 g via INTRAVENOUS

## 2017-05-08 MED ORDER — ONDANSETRON HCL 4 MG/2ML IJ SOLN
INTRAMUSCULAR | Status: AC
  Filled 2017-05-08: qty 2

## 2017-05-08 MED ORDER — LIDOCAINE 2% (20 MG/ML) 5 ML SYRINGE
INTRAMUSCULAR | Status: AC
  Filled 2017-05-08: qty 5

## 2017-05-08 MED ORDER — EPHEDRINE 5 MG/ML INJ
INTRAVENOUS | Status: AC
  Filled 2017-05-08: qty 10

## 2017-05-08 MED ORDER — BACITRACIN ZINC 500 UNIT/GM EX OINT
TOPICAL_OINTMENT | CUTANEOUS | Status: AC
  Filled 2017-05-08: qty 56.7

## 2017-05-08 MED ORDER — PROPOFOL 10 MG/ML IV BOLUS
INTRAVENOUS | Status: DC | PRN
  Administered 2017-05-08: 200 mg via INTRAVENOUS

## 2017-05-08 MED ORDER — PHENYLEPHRINE 40 MCG/ML (10ML) SYRINGE FOR IV PUSH (FOR BLOOD PRESSURE SUPPORT)
PREFILLED_SYRINGE | INTRAVENOUS | Status: AC
  Filled 2017-05-08: qty 10

## 2017-05-08 MED ORDER — LIDOCAINE-EPINEPHRINE (PF) 1 %-1:200000 IJ SOLN
INTRAMUSCULAR | Status: AC
  Filled 2017-05-08: qty 90

## 2017-05-08 SURGICAL SUPPLY — 55 items
BLADE CLIPPER SURG (BLADE) IMPLANT
BLADE SURG 15 STRL LF DISP TIS (BLADE) ×1 IMPLANT
BLADE SURG 15 STRL SS (BLADE) ×2
CANISTER SUCT 1200ML W/VALVE (MISCELLANEOUS) IMPLANT
CHLORAPREP W/TINT 26ML (MISCELLANEOUS) IMPLANT
CLOSURE WOUND 1/2 X4 (GAUZE/BANDAGES/DRESSINGS)
CORD BIPOLAR FORCEPS 12FT (ELECTRODE) IMPLANT
COVER BACK TABLE 60X90IN (DRAPES) ×3 IMPLANT
COVER MAYO STAND STRL (DRAPES) ×3 IMPLANT
DERMABOND ADVANCED (GAUZE/BANDAGES/DRESSINGS)
DERMABOND ADVANCED .7 DNX12 (GAUZE/BANDAGES/DRESSINGS) IMPLANT
DRAPE U-SHAPE 76X120 STRL (DRAPES) ×3 IMPLANT
DRSG PAD ABDOMINAL 8X10 ST (GAUZE/BANDAGES/DRESSINGS) ×3 IMPLANT
DRSG TEGADERM 2-3/8X2-3/4 SM (GAUZE/BANDAGES/DRESSINGS) IMPLANT
ELECT COATED BLADE 2.86 ST (ELECTRODE) IMPLANT
ELECT NEEDLE BLADE 2-5/6 (NEEDLE) ×3 IMPLANT
ELECT REM PT RETURN 9FT ADLT (ELECTROSURGICAL)
ELECT REM PT RETURN 9FT PED (ELECTROSURGICAL)
ELECTRODE REM PT RETRN 9FT PED (ELECTROSURGICAL) IMPLANT
ELECTRODE REM PT RTRN 9FT ADLT (ELECTROSURGICAL) IMPLANT
GAUZE SPONGE 4X4 12PLY STRL LF (GAUZE/BANDAGES/DRESSINGS) IMPLANT
GAUZE SPONGE 4X4 16PLY XRAY LF (GAUZE/BANDAGES/DRESSINGS) ×3 IMPLANT
GLOVE BIO SURGEON STRL SZ 6.5 (GLOVE) ×4 IMPLANT
GLOVE BIO SURGEONS STRL SZ 6.5 (GLOVE) ×2
GOWN STRL REUS W/ TWL LRG LVL3 (GOWN DISPOSABLE) ×2 IMPLANT
GOWN STRL REUS W/TWL LRG LVL3 (GOWN DISPOSABLE) ×4
NEEDLE HYPO 30GX1 BEV (NEEDLE) ×3 IMPLANT
NEEDLE PRECISIONGLIDE 27X1.5 (NEEDLE) IMPLANT
NS IRRIG 1000ML POUR BTL (IV SOLUTION) IMPLANT
PACK BASIN DAY SURGERY FS (CUSTOM PROCEDURE TRAY) ×3 IMPLANT
PENCIL BUTTON HOLSTER BLD 10FT (ELECTRODE) ×3 IMPLANT
RUBBERBAND STERILE (MISCELLANEOUS) IMPLANT
SHEET MEDIUM DRAPE 40X70 STRL (DRAPES) IMPLANT
SPONGE GAUZE 2X2 8PLY STER LF (GAUZE/BANDAGES/DRESSINGS)
SPONGE GAUZE 2X2 8PLY STRL LF (GAUZE/BANDAGES/DRESSINGS) IMPLANT
STRIP CLOSURE SKIN 1/2X4 (GAUZE/BANDAGES/DRESSINGS) IMPLANT
STRIP SUTURE WOUND CLOSURE 1/2 (SUTURE) IMPLANT
SUCTION FRAZIER HANDLE 10FR (MISCELLANEOUS)
SUCTION TUBE FRAZIER 10FR DISP (MISCELLANEOUS) IMPLANT
SUT ETHILON 5 0 P 3 18 (SUTURE)
SUT MNCRL 6-0 UNDY P1 1X18 (SUTURE) ×1 IMPLANT
SUT MNCRL AB 4-0 PS2 18 (SUTURE) IMPLANT
SUT MON AB 5-0 P3 18 (SUTURE) IMPLANT
SUT MONOCRYL 6-0 P1 1X18 (SUTURE) ×2
SUT NYLON ETHILON 5-0 P-3 1X18 (SUTURE) IMPLANT
SUT PLAIN 5 0 P 3 18 (SUTURE) IMPLANT
SUT VIC AB 5-0 P-3 18X BRD (SUTURE) IMPLANT
SUT VIC AB 5-0 P3 18 (SUTURE)
SUT VICRYL 4-0 PS2 18IN ABS (SUTURE) IMPLANT
SYR BULB 3OZ (MISCELLANEOUS) IMPLANT
SYR CONTROL 10ML LL (SYRINGE) ×3 IMPLANT
TOWEL OR 17X24 6PK STRL BLUE (TOWEL DISPOSABLE) ×3 IMPLANT
TRAY DSU PREP LF (CUSTOM PROCEDURE TRAY) ×3 IMPLANT
TUBE CONNECTING 20'X1/4 (TUBING)
TUBE CONNECTING 20X1/4 (TUBING) IMPLANT

## 2017-05-08 NOTE — Interval H&P Note (Signed)
History and Physical Interval Note:  05/08/2017 9:56 AM  Cameron Sharp  has presented today for surgery, with the diagnosis of PAGET DISEASE  The various methods of treatment have been discussed with the patient and family. After consideration of risks, benefits and other options for treatment, the patient has consented to  Procedure(s): REEXCISION OF EXTRA [place] (Right) as a surgical intervention .  The patient's history has been reviewed, patient examined, no change in status, stable for surgery.  I have reviewed the patient's chart and labs.  Questions were answered to the patient's satisfaction.     [name]

## 2017-05-08 NOTE — Transfer of Care (Signed)
Immediate Anesthesia Transfer of Care Note  Patient: Cameron Sharp  Procedure(s) Performed: REEXCISION OF EXTRA MAMARY PAGETS DISEASE OF RIGHT GROIN (Right Groin)  Patient Location: PACU  Anesthesia Type:General  Level of Consciousness: awake, alert , oriented and drowsy  Airway & Oxygen Therapy: Patient Spontanous Breathing and Patient connected to face mask oxygen  Post-op Assessment: Report given to RN and Post -op Vital signs reviewed and stable  Post vital signs: Reviewed and stable  Last Vitals:  Vitals:   05/08/17 0836  BP: 140/64  Pulse: (!) 58  Resp: 18  Temp: 36.4 C  SpO2: 98%    Last Pain:  Vitals:   05/08/17 0836  TempSrc: Oral  PainSc: 0-No pain         Complications: No apparent anesthesia complications

## 2017-05-08 NOTE — Anesthesia Preprocedure Evaluation (Addendum)
Anesthesia Evaluation  Patient identified by MRN, date of birth, ID band Patient awake    Reviewed: Allergy & Precautions, NPO status , Patient's Chart, lab work & pertinent test results, reviewed documented beta blocker date and time   Airway Mallampati: II  TM Distance: >3 FB Neck ROM: Full    Dental  (+) Teeth Intact, Dental Advisory Given, Caps   Pulmonary sleep apnea and Continuous Positive Airway Pressure Ventilation , former smoker,    Pulmonary exam normal breath sounds clear to auscultation       Cardiovascular hypertension, Pt. on home beta blockers and Pt. on medications + Peripheral Vascular Disease  Normal cardiovascular exam+ dysrhythmias Atrial Fibrillation + pacemaker  Rhythm:Regular Rate:Normal     Neuro/Psych negative neurological ROS  negative psych ROS   GI/Hepatic negative GI ROS, Neg liver ROS,   Endo/Other  Obesity   Renal/GU negative Renal ROS     Musculoskeletal  (+) Arthritis , Osteoarthritis,    Abdominal   Peds  Hematology  (+) Blood dyscrasia (Coumadin), ,   Anesthesia Other Findings Day of surgery medications reviewed with the patient.  Reproductive/Obstetrics                            Anesthesia Physical Anesthesia Plan  ASA: III  Anesthesia Plan: General   Post-op Pain Management:    Induction: Intravenous  PONV Risk Score and Plan: 2 and Ondansetron and Dexamethasone  Airway Management Planned: LMA  Additional Equipment:   Intra-op Plan:   Post-operative Plan: Extubation in OR  Informed Consent: I have reviewed the patients History and Physical, chart, labs and discussed the procedure including the risks, benefits and alternatives for the proposed anesthesia with the patient or authorized representative who has indicated his/her understanding and acceptance.   Dental advisory given  Plan Discussed with: CRNA  Anesthesia Plan Comments:  (Risks/benefits of general anesthesia discussed with patient including risk of damage to teeth, lips, gum, and tongue, nausea/vomiting, allergic reactions to medications, and the possibility of heart attack, stroke and death.  All patient questions answered.  Patient wishes to proceed.)        Anesthesia Quick Evaluation

## 2017-05-08 NOTE — Discharge Instructions (Signed)
Triple antibiotic daily to right groin May shower starting tomorrow   Post Anesthesia Home Care Instructions  Activity: Get plenty of rest for the remainder of the day. A responsible individual must stay with you for 24 hours following the procedure.  For the next 24 hours, DO NOT: -Drive a car -Paediatric nurse -Drink alcoholic beverages -Take any medication unless instructed by your physician -Make any legal decisions or sign important papers.  Meals: Start with liquid foods such as gelatin or soup. Progress to regular foods as tolerated. Avoid greasy, spicy, heavy foods. If nausea and/or vomiting occur, drink only clear liquids until the nausea and/or vomiting subsides. Call your physician if vomiting continues.  Special Instructions/Symptoms: Your throat may feel dry or sore from the anesthesia or the breathing tube placed in your throat during surgery. If this causes discomfort, gargle with warm salt water. The discomfort should disappear within 24 hours.  If you had a scopolamine patch placed behind your ear for the management of post- operative nausea and/or vomiting:  1. The medication in the patch is effective for 72 hours, after which it should be removed.  Wrap patch in a tissue and discard in the trash. Wash hands thoroughly with soap and water. 2. You may remove the patch earlier than 72 hours if you experience unpleasant side effects which may include dry mouth, dizziness or visual disturbances. 3. Avoid touching the patch. Wash your hands with soap and water after contact with the patch.

## 2017-05-08 NOTE — Op Note (Signed)
DATE OF OPERATION: 05/08/2017  LOCATION: [facility]  PREOPERATIVE DIAGNOSIS: Right groin extramammary paget's disease  POSTOPERATIVE DIAGNOSIS: Same  PROCEDURE: Excision of right groin extramammary paget's disease 2 x 6 cm with intermediate closure  SURGEON: Rabia Argote Sanger Ozell Juhasz, DO  ASSISTANT: Shawn Rayburn, PA  EBL: 5 cc  CONDITION: Stable  COMPLICATIONS: None  INDICATION: The patient, Cameron Sharp, is a 81 y.o. male born on 05-08-1936, is here for treatment of a right groin skin lesion.  He underwent excision of the lesion but had positive margins.   PROCEDURE DETAILS:  The patient was seen prior to surgery and marked.  The IV antibiotics were given. The patient was taken to the operating room and given a general anesthetic. A standard time out was performed and all information was confirmed by those in the room. SCDs were placed.   The right groin was marked at the inferior most portion of the previous excision site.  Local was injected.  After waiting several minutes the bovie was used to excise the 2 x 6 cm area of skin.  Marking stitches were placed: long at 12 o'clock, short 3 o'clock and double at 9 o'clock.  Undermining was done with the skin hooks for retraction and the bovie.  This was done to decrease the tension with closure.  Deep 3-0 Monocryl was placed.  The 4-0 and 5-0 Monocryl were used to close the final dermal and subcuticular layers.  Triple antibiotic was placed.  The patient was allowed to wake up and taken to recovery room in stable condition at the end of the case. The family was notified at the end of the case.

## 2017-05-08 NOTE — Anesthesia Postprocedure Evaluation (Signed)
Anesthesia Post Note  Patient: Cameron Sharp  Procedure(s) Performed: REEXCISION OF EXTRA MAMARY PAGETS DISEASE OF RIGHT GROIN (Right Groin)     Patient location during evaluation: PACU Anesthesia Type: General Level of consciousness: awake and alert Pain management: pain level controlled Vital Signs Assessment: post-procedure vital signs reviewed and stable Respiratory status: spontaneous breathing, nonlabored ventilation and respiratory function stable Cardiovascular status: blood pressure returned to baseline and stable Postop Assessment: no apparent nausea or vomiting Anesthetic complications: no    Last Vitals:  Vitals:   05/08/17 1130 05/08/17 1155  BP: 125/69 (!) 137/94  Pulse: (!) 59 68  Resp: 15 18  Temp:  36.5 C  SpO2: 98% 99%    Last Pain:  Vitals:   05/08/17 1155  TempSrc: Oral  PainSc: 2                  Catalina Gravel

## 2017-05-08 NOTE — H&P (View-Only) (Signed)
Cameron Sharp is an 81 y.o. male.   Chief Complaint: extramammary paget's disease HPI: The patient is a 81 yrs old male here for excision of the right groin for extra mammary paget's disease.  The path from this excision was positive at 7 to 8 o'clock margins.  History:  He underwent excision of an area of extra mammary paget's disease of the right groin skin and excision of a sebaceous cyst of his right upper back on 11/01/16. Re-excised on 11/15/16 due to positive margins. The follow up path returned close margins and plans were made to re-excise the area again after several months as he needed time for the area to heal and stretch to allow for further excision.    Past Medical History:  Diagnosis Date  . Arthritis    back and neck  . Atrial fibrillation ([place]) 10/10/2012  . Chronic back pain   . Gout   . HTN (hypertension) 05/01/2013  . Hyperlipidemia   . Hypertension   . OSA on CPAP    wears CPAP nightly  . Pacemaker   . Pacemaker -[place] 2010 05/01/2013  . Permanent atrial fibrillation [facility])     Past Surgical History:  Procedure Laterality Date  . carpel tunnel surgery Right 2012  . CHOLECYSTECTOMY    . COLONOSCOPY    . GALLBLADDER SURGERY  12/2006  . LIPOMA EXCISION N/A 11/01/2016   Procedure: EXCISION OF SEBACEOUS CYST ON BACK;  Surgeon: [name], DO;  Location: [place];  Service: Plastics;  Laterality: N/A;  . MASS EXCISION N/A 11/01/2016   Procedure: EXCISION OF RIGHT GROIN SKIN LESION;  Surgeon: [name], DO;  Location: [place];  Service: Plastics;  Laterality: N/A;  . MASS EXCISION Right 11/15/2016   Procedure: EXCISION POSITIVE MARGIN RIGHT GROIN;  Surgeon: [name], DO;  Location: [place];  Service: Plastics;  Laterality: Right;  . MASS EXCISION Right 04/04/2017   Procedure: REEXCISION OF EXTRA MAMARY PAGETS SKIN DISEASE OF RIGHT GROIN;  Surgeon: [name], DO;  Location: [place];  Service: Plastics;  Laterality: Right;  . NM MYOCAR PERF WALL MOTION  05/22/07   no significant ischemia  . PERMANENT PACEMAKER INSERTION  01/09/2009   [facility]  . TONSILLECTOMY     age 64  . US ECHOCARDIOGRAPHY  11/24/07   mild LVH,LA & RA mod to severely dilated,mild mitral annular ca+,mild TR,mild Pulmonary hypertensio,AOV mod. sclerotic,mild AI w/root dilatation and ca+.    Family History  Problem Relation Age of Onset  . Cancer Father    Social History:  reports that he quit smoking about 40 years ago. He has never used smokeless tobacco. He reports that he drinks alcohol. He reports that he does not use drugs.  Allergies: No Known Allergies   (Not in a hospital admission)  No results found for this or any previous visit (from the past 48 hour(s)). No results found.  Review of Systems  Constitutional: Negative.   HENT: Negative.   Eyes: Negative.   Respiratory: Negative.   Cardiovascular: Negative.   Gastrointestinal: Negative.   Genitourinary: Negative.   Musculoskeletal: Negative.   Skin: Negative.   Neurological: Negative.   Psychiatric/Behavioral: Negative.     There were no vitals taken for this visit. Physical Exam  Constitutional: He is oriented to person, place, and time. He appears well-developed and well-nourished.  HENT:  Head: Normocephalic and atraumatic.  Eyes: Pupils are equal,  round, and reactive to light. EOM are normal.  Cardiovascular: Normal rate.   Respiratory: Effort normal.  GI: Soft.  Musculoskeletal:       Legs: Neurological: He is alert and oriented to person, place, and time.  Skin: Skin is warm.  Psychiatric: He has a normal mood and affect. His behavior is normal. Judgment and thought content normal.     Assessment/Plan Excision of right groin extra-mammary paget's disease.  [name], DO 05/07/2017, 2:28 PM

## 2017-05-08 NOTE — Anesthesia Procedure Notes (Signed)
Procedure Name: LMA Insertion Date/Time: 05/08/2017 10:29 AM Performed by: Melynda Ripple D Pre-anesthesia Checklist: Patient identified, Emergency Drugs available, Suction available and Patient being monitored Patient Re-evaluated:Patient Re-evaluated prior to induction Oxygen Delivery Method: Circle system utilized Preoxygenation: Pre-oxygenation with 100% oxygen Induction Type: IV induction Ventilation: Mask ventilation without difficulty LMA: LMA inserted LMA Size: 5.0 Number of attempts: 1 Airway Equipment and Method: Bite block Placement Confirmation: positive ETCO2 Tube secured with: Tape Dental Injury: Teeth and Oropharynx as per pre-operative assessment

## 2017-05-09 ENCOUNTER — Encounter (HOSPITAL_BASED_OUTPATIENT_CLINIC_OR_DEPARTMENT_OTHER): Payer: Self-pay | Admitting: Plastic Surgery

## 2017-05-09 NOTE — Addendum Note (Signed)
Addendum  created 05/09/17 1756 by Catalina Gravel, MD   Anesthesia Staff edited

## 2017-05-17 ENCOUNTER — Other Ambulatory Visit: Payer: Self-pay | Admitting: Cardiovascular Disease

## 2017-05-20 ENCOUNTER — Telehealth: Payer: Self-pay | Admitting: *Deleted

## 2017-05-20 DIAGNOSIS — C4499 Other specified malignant neoplasm of skin, unspecified: Secondary | ICD-10-CM

## 2017-05-20 NOTE — Telephone Encounter (Signed)
Call from [place], [place] with Dr. Marla Roe: Pt had another R groin excision of Paget's disease on 10/17.  Pathology discussed with Dr. Benay Spice: Verbal order received for radiation referral. Left message for pt to call office to discuss plan.

## 2017-05-20 NOTE — Telephone Encounter (Addendum)
Pt returned call, informed him that Dr. Benay Spice made a referral to radiation oncology. Pt asked if doing nothing was still an option. Informed him it'd be best to make that decision after meeting with the radiation MD. He would like to wait until after his follow up visit with the surgeon.

## 2017-05-21 ENCOUNTER — Ambulatory Visit (INDEPENDENT_AMBULATORY_CARE_PROVIDER_SITE_OTHER): Payer: Medicare Other | Admitting: Pharmacist

## 2017-05-21 DIAGNOSIS — I4821 Permanent atrial fibrillation: Secondary | ICD-10-CM

## 2017-05-21 DIAGNOSIS — I482 Chronic atrial fibrillation: Secondary | ICD-10-CM | POA: Diagnosis not present

## 2017-05-21 DIAGNOSIS — Z7901 Long term (current) use of anticoagulants: Secondary | ICD-10-CM | POA: Diagnosis not present

## 2017-05-21 DIAGNOSIS — Z952 Presence of prosthetic heart valve: Secondary | ICD-10-CM | POA: Diagnosis not present

## 2017-05-21 LAB — POCT INR: INR: 1.3

## 2017-05-22 ENCOUNTER — Encounter: Payer: Self-pay | Admitting: Radiation Oncology

## 2017-05-23 NOTE — Progress Notes (Signed)
Histology and Location of Primary Cancer: Extramammary Paget's disease   Location(s) of Symptomatic tumor(s): right groin  Biopsies revealed:  05/08/17 Diagnosis Skin , Mammary Paget's tissue - EXTRA MAMMARY PAGET'S DISEASE, SPANNING 0.6 CM. - DISEASE IS 0.1 CM TO THE 9 O'CLOCK MARGIN.  04/04/17 Diagnosis Skin , Right Groin - EXTRAMAMMARY PAGET'S DISEASE, INVOLVING APPROXIMATE 7-8:00 MARGINS.  Past/Anticipated chemotherapy by medical oncology, if any: no  Pain on a scale of 0-10 is: 0  SAFETY ISSUES:  Prior radiation? no  Pacemaker/ICD? yes  Possible current pregnancy? no  Is the patient on methotrexate? no  Additional Complaints / other details:  Patient is here with his wife.  Patient mentioned that he has venous insufficiency and is worried about lymphedema in his right leg.  He said he is supposed to wait 3 weeks after his last surgey.  He said Dr. Baltazar Apo would like to talk with Dr. Sondra Come before he starts treatment.  BP (!) 119/55 (BP Location: Left Arm, Patient Position: Sitting)   Pulse 60   Temp 97.8 F (36.6 C) (Oral)   Ht 6\' 3"  (1.905 m)   Wt 270 lb 3.2 oz (122.6 kg)   SpO2 98%   BMI 33.77 kg/m    Wt Readings from Last 3 Encounters:  05/30/17 270 lb 3.2 oz (122.6 kg)  05/08/17 278 lb (126.1 kg)  04/17/17 268 lb 3.2 oz (121.7 kg)

## 2017-05-28 DIAGNOSIS — C44599 Other specified malignant neoplasm of skin of other part of trunk: Secondary | ICD-10-CM | POA: Diagnosis not present

## 2017-05-30 ENCOUNTER — Ambulatory Visit
Admission: RE | Admit: 2017-05-30 | Discharge: 2017-05-30 | Disposition: A | Discharge status: Home / Self Care | Payer: Medicare Other | Source: Ambulatory Visit | Attending: Radiation Oncology | Admitting: Radiation Oncology

## 2017-05-30 ENCOUNTER — Encounter: Payer: Self-pay | Admitting: Radiation Oncology

## 2017-05-30 VITALS — BP 119/55 | HR 60 | Temp 97.8°F | Ht 75.0 in | Wt 270.2 lb

## 2017-05-30 DIAGNOSIS — Z7901 Long term (current) use of anticoagulants: Secondary | ICD-10-CM | POA: Diagnosis not present

## 2017-05-30 DIAGNOSIS — M549 Dorsalgia, unspecified: Secondary | ICD-10-CM | POA: Insufficient documentation

## 2017-05-30 DIAGNOSIS — M1990 Unspecified osteoarthritis, unspecified site: Secondary | ICD-10-CM | POA: Insufficient documentation

## 2017-05-30 DIAGNOSIS — G4733 Obstructive sleep apnea (adult) (pediatric): Secondary | ICD-10-CM | POA: Diagnosis not present

## 2017-05-30 DIAGNOSIS — Z800 Family history of malignant neoplasm of digestive organs: Secondary | ICD-10-CM | POA: Insufficient documentation

## 2017-05-30 DIAGNOSIS — C4499 Other specified malignant neoplasm of skin, unspecified: Secondary | ICD-10-CM

## 2017-05-30 DIAGNOSIS — Z950 Presence of cardiac pacemaker: Secondary | ICD-10-CM | POA: Insufficient documentation

## 2017-05-30 DIAGNOSIS — M109 Gout, unspecified: Secondary | ICD-10-CM | POA: Insufficient documentation

## 2017-05-30 DIAGNOSIS — C44599 Other specified malignant neoplasm of skin of other part of trunk: Secondary | ICD-10-CM | POA: Insufficient documentation

## 2017-05-30 DIAGNOSIS — E785 Hyperlipidemia, unspecified: Secondary | ICD-10-CM | POA: Diagnosis not present

## 2017-05-30 DIAGNOSIS — G8929 Other chronic pain: Secondary | ICD-10-CM | POA: Diagnosis not present

## 2017-05-30 DIAGNOSIS — Z87891 Personal history of nicotine dependence: Secondary | ICD-10-CM | POA: Diagnosis not present

## 2017-05-30 DIAGNOSIS — Z9889 Other specified postprocedural states: Secondary | ICD-10-CM | POA: Diagnosis not present

## 2017-05-30 DIAGNOSIS — I10 Essential (primary) hypertension: Secondary | ICD-10-CM | POA: Insufficient documentation

## 2017-05-30 DIAGNOSIS — I482 Chronic atrial fibrillation: Secondary | ICD-10-CM | POA: Diagnosis not present

## 2017-05-30 DIAGNOSIS — Z79899 Other long term (current) drug therapy: Secondary | ICD-10-CM | POA: Insufficient documentation

## 2017-05-30 NOTE — Progress Notes (Signed)
Radiation Oncology         [phone] ________________________________  Initial outpatient Consultation  Name: Cameron Sharp MRN: [id number]  Date: 05/30/2017  DOB: 1936-03-24  CC:Kim, Jeneen Rinks, MD  Ladell Pier, MD   REFERRING PHYSICIAN: Ladell Pier, MD  DIAGNOSIS: Extramammary Paget's disease presenting in the right inguinal region  HISTORY OF PRESENT ILLNESS::Cameron Sharp is a 81 y.o. male who is seen out courtesy of Dr Julieanne Manson for an opinion concerning radiation therapy as part of management of the patient's extramammary Paget's disease presenting in the right groin. Several months ago the patient noted a crusting area in his right groin area. He was seen by Dr Rolm Bookbinder and a biopsy was performed of this area revealing Paget's disease. Patient was referred to urology for consideration for additional surgery but given the location was felt the patient needed to see a plastic surgeon. He was seen by Dr Marla Roe and has undergone  excisions from the right groin and underwent the last excision 05/08/17.   Pathology from the last excision showed:  EXTRA MAMMARY PAGET'S DISEASE, SPANNING 0.6 CM. - DISEASE IS 0.1 CM TO THE 9 O'CLOCK MARGIN. Radiation therapy has been consulted for consideration for postoperative treatment given the difficulties with obtaining clear margins.   PREVIOUS RADIATION THERAPY: No  PAST MEDICAL HISTORY:  has a past medical history of Arthritis, Atrial fibrillation ([place]) (10/10/2012), Chronic back pain, Gout, HTN (hypertension) (05/01/2013), Hyperlipidemia, Hypertension, OSA on CPAP, Pacemaker, Pacemaker -single-chamber [employer] [place] 2010 (05/01/2013), and Permanent atrial fibrillation ([place]).    PAST SURGICAL HISTORY: Past Surgical History:  Procedure Laterality Date  . carpel tunnel surgery Right 2012  . CHOLECYSTECTOMY    . COLONOSCOPY    . GALLBLADDER SURGERY  12/2006  . NM MYOCAR PERF WALL MOTION  05/22/07   no significant ischemia    . PERMANENT PACEMAKER INSERTION  01/09/2009   [facility]  . TONSILLECTOMY     age 76  . US ECHOCARDIOGRAPHY  11/24/07   mild LVH,LA & RA mod to severely dilated,mild mitral annular ca+,mild TR,mild Pulmonary hypertensio,AOV mod. sclerotic,mild AI w/root dilatation and ca+.    FAMILY HISTORY: family history includes Breast cancer in his paternal grandmother; Lung cancer in his maternal grandfather; Prostate cancer in his paternal grandfather; Stomach cancer in his father.  SOCIAL HISTORY:  reports that he quit smoking about 40 years ago. He has a 39.00 pack-year smoking history. he has never used smokeless tobacco. He reports that he drinks alcohol. He reports that he does not use drugs.  ALLERGIES: Patient has no known allergies.  MEDICATIONS:  Current Outpatient Medications  Medication Sig Dispense Refill  . atorvastatin (LIPITOR) 20 MG tablet Take 1 tablet (20 mg total) by mouth daily. 90 tablet 3  . baclofen (LIORESAL) 10 MG tablet Take 1 tablet by mouth 2 (two) times daily.  0  . COUMADIN 5 MG tablet TAKE 1 TABLET DAILY OR AS DIRECTED 90 tablet 3  . docusate sodium (COLACE) 100 MG capsule Take 100 mg by mouth 2 (two) times daily.    Marland Kitchen gabapentin (NEURONTIN) 300 MG capsule Take 900 mg by mouth daily.     . metoprolol tartrate (LOPRESSOR) 25 MG tablet Take 0.5 tablets (12.5 mg total) by mouth every morning. 45 tablet 3  . Thiamine HCl (VITAMIN B-1) 250 MG tablet Take 250 mg by mouth daily.    Marland Kitchen ULORIC 40 MG tablet Take 1 tablet (40 mg total) by mouth daily. 30 tablet 6  .  valsartan-hydrochlorothiazide (DIOVAN-HCT) 160-12.5 MG tablet TAKE 1 TABLET DAILY 90 tablet 3   No current facility-administered medications for this encounter.     REVIEW OF SYSTEMS: REVIEW OF SYSTEMS: A 10+ POINT REVIEW OF SYSTEMS WAS OBTAINED including neurology, dermatology, psychiatry, cardiac, respiratory, lymph, extremities, GI, GU, musculoskeletal, constitutional, reproductive, HEENT. All pertinent positives are  noted in the HPI. All others are negative.   PHYSICAL EXAM:  height is 6\' 3"  (1.905 m) and weight is 270 lb 3.2 oz (122.6 kg). His oral temperature is 97.8 F (36.6 C). His blood pressure is 119/55 (abnormal) and his pulse is 60. His oxygen saturation is 98%.   General: Alert and oriented, in no acute distress HEENT: Head is normocephalic. Extraocular movements are intact. Oropharynx is clear. Neck: Neck is supple, no palpable cervical or supraclavicular lymphadenopathy. Heart: Regular in rate and rhythm with no murmurs, rubs, or gallops. Pacemaker in place in the left upper chest. Patient appears to be in regular rhythm at this time Chest: Clear to auscultation bilaterally, with no rhonchi, wheezes, or rales. Abdomen: Soft, nontender, nondistended, with no rigidity or guarding. Extremities: Patient has chronic edema in the right lower extremity with edema extending up into the right medial thigh area. The patient does have a stocking in place extending to the knee area Lymphatics: see Neck Exam Skin: No concerning lesions. Musculoskeletal: symmetric strength and muscle tone throughout. Neurologic: Cranial nerves II through XII are grossly intact. No obvious focalities. Speech is fluent. Coordination is intact. Psychiatric: Judgment and insight are intact. Affect is appropriate. Examination of the right inguinal area reveals a long scar extending through much of the inguinal crease. There is a small amount of blood along the central portion of the scar but no significant separation a scar is noted. No signs of infection.     ECOG = 1  LABORATORY DATA:  Lab Results  Component Value Date   WBC 5.9 11/19/2013   HGB 14.3 05/08/2017   HCT 42.0 05/08/2017   MCV 95.3 11/19/2013   PLT 191 11/19/2013   Lab Results  Component Value Date   NA 140 05/08/2017   K 5.5 (H) 05/08/2017   CL 107 05/08/2017   CO2 26 04/03/2017   GLUCOSE 97 05/08/2017   CREATININE 0.90 05/08/2017   CALCIUM 9.2  04/03/2017      RADIOGRAPHY: No results found.    IMPRESSION: Extramammary Paget's disease presenting in the right inguinal region. Patient has had at least 4 surgeries in this area. With difficulty clearing the margins,  I reviewed the option of radiation therapy at this point the patient and his wife. The patient is hesitant considering radiation therapy as part of his management. To treat this area would also result in significant skin breakdown/ mobidity with some potential for worsening right lower extremity edema. I did speak with Dr. Marla Roe and she does feel that the patient would be a potential candidate for one additional surgery to clear the most recent close margin.  If the patient is found to have a positive margin at that time or residual disease then he will likely need to proceed with postoperative radiation therapy  PLAN: Patient will proceed with additional surgery depending on Dr. Eusebio Friendly schedule. Final decision regarding postoperative radiation therapy will depend on results of this final surgery.     ------------------------------------------------  Blair Promise, PhD, MD

## 2017-06-03 ENCOUNTER — Ambulatory Visit (INDEPENDENT_AMBULATORY_CARE_PROVIDER_SITE_OTHER): Payer: Medicare Other | Admitting: Pharmacist Clinician (PhC)/ Clinical Pharmacy Specialist

## 2017-06-03 DIAGNOSIS — I482 Chronic atrial fibrillation: Secondary | ICD-10-CM

## 2017-06-03 DIAGNOSIS — I4821 Permanent atrial fibrillation: Secondary | ICD-10-CM

## 2017-06-03 DIAGNOSIS — Z7901 Long term (current) use of anticoagulants: Secondary | ICD-10-CM

## 2017-06-03 LAB — POCT INR: INR: 2

## 2017-06-06 DIAGNOSIS — G894 Chronic pain syndrome: Secondary | ICD-10-CM | POA: Diagnosis not present

## 2017-06-06 DIAGNOSIS — M47816 Spondylosis without myelopathy or radiculopathy, lumbar region: Secondary | ICD-10-CM | POA: Diagnosis not present

## 2017-06-06 DIAGNOSIS — M549 Dorsalgia, unspecified: Secondary | ICD-10-CM | POA: Diagnosis not present

## 2017-06-06 DIAGNOSIS — Z79891 Long term (current) use of opiate analgesic: Secondary | ICD-10-CM | POA: Diagnosis not present

## 2017-06-06 DIAGNOSIS — Z79899 Other long term (current) drug therapy: Secondary | ICD-10-CM | POA: Diagnosis not present

## 2017-06-17 ENCOUNTER — Ambulatory Visit (INDEPENDENT_AMBULATORY_CARE_PROVIDER_SITE_OTHER): Payer: Medicare Other | Admitting: Pharmacist Clinician (PhC)/ Clinical Pharmacy Specialist

## 2017-06-17 DIAGNOSIS — I482 Chronic atrial fibrillation: Secondary | ICD-10-CM

## 2017-06-17 DIAGNOSIS — I4821 Permanent atrial fibrillation: Secondary | ICD-10-CM

## 2017-06-17 DIAGNOSIS — Z7901 Long term (current) use of anticoagulants: Secondary | ICD-10-CM

## 2017-06-17 LAB — POCT INR: INR: 2.7

## 2017-06-25 DIAGNOSIS — D485 Neoplasm of uncertain behavior of skin: Secondary | ICD-10-CM | POA: Diagnosis not present

## 2017-06-25 DIAGNOSIS — C44599 Other specified malignant neoplasm of skin of other part of trunk: Secondary | ICD-10-CM | POA: Diagnosis not present

## 2017-07-12 ENCOUNTER — Ambulatory Visit (INDEPENDENT_AMBULATORY_CARE_PROVIDER_SITE_OTHER): Payer: Medicare Other | Admitting: Pharmacist Clinician (PhC)/ Clinical Pharmacy Specialist

## 2017-07-12 DIAGNOSIS — Z7901 Long term (current) use of anticoagulants: Secondary | ICD-10-CM

## 2017-07-12 DIAGNOSIS — I482 Chronic atrial fibrillation: Secondary | ICD-10-CM

## 2017-07-12 DIAGNOSIS — I4821 Permanent atrial fibrillation: Secondary | ICD-10-CM

## 2017-07-12 LAB — POCT INR: INR: 2.2

## 2017-07-15 ENCOUNTER — Other Ambulatory Visit: Payer: Self-pay | Admitting: Cardiovascular Disease

## 2017-07-26 ENCOUNTER — Encounter (HOSPITAL_BASED_OUTPATIENT_CLINIC_OR_DEPARTMENT_OTHER): Payer: Self-pay | Admitting: *Deleted

## 2017-07-26 ENCOUNTER — Other Ambulatory Visit: Payer: Self-pay

## 2017-07-26 ENCOUNTER — Ambulatory Visit: Payer: Self-pay | Admitting: Plastic Surgery

## 2017-07-26 DIAGNOSIS — L989 Disorder of the skin and subcutaneous tissue, unspecified: Secondary | ICD-10-CM

## 2017-07-26 DIAGNOSIS — C4499 Other specified malignant neoplasm of skin, unspecified: Secondary | ICD-10-CM

## 2017-07-26 MED ORDER — FENTANYL CITRATE (PF) 100 MCG/2ML IJ SOLN
25.0000 ug | INTRAMUSCULAR | Status: DC | PRN
  Administered 2017-07-31 (×2): 50 ug via INTRAVENOUS
  Administered 2017-07-31: 25 ug via INTRAVENOUS

## 2017-07-26 MED ORDER — ONDANSETRON HCL 4 MG/2ML IJ SOLN
4.0000 mg | Freq: Once | INTRAMUSCULAR | Status: AC | PRN
  Administered 2017-07-31: 4 mg via INTRAVENOUS

## 2017-07-26 MED ORDER — FENTANYL CITRATE (PF) 100 MCG/2ML IJ SOLN
25.0000 ug | INTRAMUSCULAR | Status: DC | PRN
  Administered 2017-07-31: 50 ug via INTRAVENOUS

## 2017-07-26 MED ORDER — ONDANSETRON HCL 4 MG/2ML IJ SOLN: 4.0000 mg | Freq: Once | INTRAMUSCULAR | Status: DC | PRN

## 2017-07-26 NOTE — H&P (View-Only) (Signed)
Cameron Sharp is an 82 y.o. male.   Chief Complaint: extramammary paget's disease and changing skin lesion HPI: The patient is a 82 yrs old wm here for his paget's disease.  He has undergone several excisions with positive margins.  He now has a skin lesion on the left groin that is changing.  We plan for excision of both areas. He will hold his coumadin 5 days prior to surgery and start back the day aft  Past Medical History:  Diagnosis Date  . Arthritis    back and neck  . Atrial fibrillation ([place]) 10/10/2012  . Chronic back pain   . Gout   . HTN (hypertension) 05/01/2013  . Hyperlipidemia   . Hypertension   . OSA on CPAP    wears CPAP nightly  . Pacemaker   . Pacemaker -[place] 2010 05/01/2013  . Paget disease, extramammary    left groin  . Permanent atrial fibrillation [facility])     Past Surgical History:  Procedure Laterality Date  . carpel tunnel surgery Right 2012  . CHOLECYSTECTOMY    . COLONOSCOPY    . CYST EXCISION Right 05/08/2017   Procedure: REEXCISION OF EXTRA MAMARY PAGETS DISEASE OF RIGHT GROIN;  Surgeon: [name], DO;  Location: [place];  Service: Plastics;  Laterality: Right;  . GALLBLADDER SURGERY  12/2006  . LIPOMA EXCISION N/A 11/01/2016   Procedure: EXCISION OF SEBACEOUS CYST ON BACK;  Surgeon: [name], DO;  Location: [place];  Service: Plastics;  Laterality: N/A;  . MASS EXCISION N/A 11/01/2016   Procedure: EXCISION OF RIGHT GROIN SKIN LESION;  Surgeon: [name], DO;  Location: [place];  Service: Plastics;  Laterality: N/A;  . MASS EXCISION Right 11/15/2016   Procedure: EXCISION POSITIVE MARGIN RIGHT GROIN;  Surgeon: [name], DO;  Location: [place];  Service: Plastics;  Laterality: Right;  . MASS EXCISION Right 04/04/2017   Procedure: REEXCISION OF EXTRA MAMARY PAGETS SKIN DISEASE OF RIGHT GROIN;  Surgeon: [name], DO;  Location: [place];  Service: Plastics;  Laterality: Right;  . NM MYOCAR PERF WALL MOTION  05/22/07   no significant ischemia  . PERMANENT PACEMAKER INSERTION  01/09/2009   [facility]  . TONSILLECTOMY     age 56  . US ECHOCARDIOGRAPHY  11/24/07   mild LVH,LA & RA mod to severely dilated,mild mitral annular ca+,mild TR,mild Pulmonary hypertensio,AOV mod. sclerotic,mild AI w/root dilatation and ca+.    Family History  Problem Relation Age of Onset  . Stomach cancer Father   . Lung cancer Maternal Grandfather   . Breast cancer Paternal Grandmother   . Prostate cancer Paternal Grandfather    Social History:  reports that he quit smoking about 41 years ago. He has a 39.00 pack-year smoking history. he has never used smokeless tobacco. He reports that he drinks alcohol. He reports that he does not use drugs.  Allergies: No Known Allergies   (Not in a hospital admission)  No results found for this or any previous visit (from the past 48 hour(s)). No results found.  Review of Systems  Constitutional: Negative.   HENT: Negative.   Eyes: Negative.   Respiratory: Negative.   Cardiovascular: Negative.   Gastrointestinal: Negative.   Genitourinary: Negative.   Musculoskeletal: Negative.   Skin: Negative.   Neurological: Negative.   Psychiatric/Behavioral: Negative.     There were no vitals taken for this visit.  Physical Exam  Constitutional: He is oriented to person, place, and time. He appears well-developed and well-nourished.  HENT:  Head: Normocephalic and atraumatic.  Eyes: EOM are normal. Pupils are equal, round, and reactive to light.  Cardiovascular: Normal rate.  Respiratory: Effort normal.  Musculoskeletal:       Legs: Neurological: He is alert and oriented to person, place, and time.  Skin: Skin is warm. There is erythema.  Psychiatric: He has a normal mood and affect. His behavior is normal. Judgment and thought content normal.      Assessment/Plan Excision of bilateral groin skin lesions.  [place], DO 07/26/2017, 3:48 PM

## 2017-07-26 NOTE — Progress Notes (Signed)
This will be patient's 5th surgery for exc Paget's disease, all done here at [facility]. Last surgery done 05-08-17. There are multiple notes in Epic that surgery will not interfere with pacemaker function. Last cards note from Dr Claiborne Billings done 04-12-17. Patient will stop Coumadin 07-25-16, he will come in for BMET and PT/INR prior to surgery.

## 2017-07-26 NOTE — H&P (Signed)
Cameron Sharp is an 82 y.o. male.   Chief Complaint: extramammary paget's disease and changing skin lesion HPI: The patient is a 82 yrs old wm here for his paget's disease.  He has undergone several excisions with positive margins.  He now has a skin lesion on the left groin that is changing.  We plan for excision of both areas. He will hold his coumadin 5 days prior to surgery and start back the day aft  Past Medical History:  Diagnosis Date  . Arthritis    back and neck  . Atrial fibrillation ([place]) 10/10/2012  . Chronic back pain   . Gout   . HTN (hypertension) 05/01/2013  . Hyperlipidemia   . Hypertension   . OSA on CPAP    wears CPAP nightly  . Pacemaker   . Pacemaker -[place] 2010 05/01/2013  . Paget disease, extramammary    left groin  . Permanent atrial fibrillation [facility])     Past Surgical History:  Procedure Laterality Date  . carpel tunnel surgery Right 2012  . CHOLECYSTECTOMY    . COLONOSCOPY    . CYST EXCISION Right 05/08/2017   Procedure: REEXCISION OF EXTRA MAMARY PAGETS DISEASE OF RIGHT GROIN;  Surgeon: [name], DO;  Location: [place];  Service: Plastics;  Laterality: Right;  . GALLBLADDER SURGERY  12/2006  . LIPOMA EXCISION N/A 11/01/2016   Procedure: EXCISION OF SEBACEOUS CYST ON BACK;  Surgeon: [name], DO;  Location: [place];  Service: Plastics;  Laterality: N/A;  . MASS EXCISION N/A 11/01/2016   Procedure: EXCISION OF RIGHT GROIN SKIN LESION;  Surgeon: [name], DO;  Location: [place];  Service: Plastics;  Laterality: N/A;  . MASS EXCISION Right 11/15/2016   Procedure: EXCISION POSITIVE MARGIN RIGHT GROIN;  Surgeon: [name], DO;  Location: [place];  Service: Plastics;  Laterality: Right;  . MASS EXCISION Right 04/04/2017   Procedure: REEXCISION OF EXTRA MAMARY PAGETS SKIN DISEASE OF RIGHT GROIN;  Surgeon: [name], DO;  Location: [place];  Service: Plastics;  Laterality: Right;  . NM MYOCAR PERF WALL MOTION  05/22/07   no significant ischemia  . PERMANENT PACEMAKER INSERTION  01/09/2009   [facility]  . TONSILLECTOMY     age 60  . US ECHOCARDIOGRAPHY  11/24/07   mild LVH,LA & RA mod to severely dilated,mild mitral annular ca+,mild TR,mild Pulmonary hypertensio,AOV mod. sclerotic,mild AI w/root dilatation and ca+.    Family History  Problem Relation Age of Onset  . Stomach cancer Father   . Lung cancer Maternal Grandfather   . Breast cancer Paternal Grandmother   . Prostate cancer Paternal Grandfather    Social History:  reports that he quit smoking about 41 years ago. He has a 39.00 pack-year smoking history. he has never used smokeless tobacco. He reports that he drinks alcohol. He reports that he does not use drugs.  Allergies: No Known Allergies   (Not in a hospital admission)  No results found for this or any previous visit (from the past 48 hour(s)). No results found.  Review of Systems  Constitutional: Negative.   HENT: Negative.   Eyes: Negative.   Respiratory: Negative.   Cardiovascular: Negative.   Gastrointestinal: Negative.   Genitourinary: Negative.   Musculoskeletal: Negative.   Skin: Negative.   Neurological: Negative.   Psychiatric/Behavioral: Negative.     There were no vitals taken for this visit.  Physical Exam  Constitutional: He is oriented to person, place, and time. He appears well-developed and well-nourished.  HENT:  Head: Normocephalic and atraumatic.  Eyes: EOM are normal. Pupils are equal, round, and reactive to light.  Cardiovascular: Normal rate.  Respiratory: Effort normal.  Musculoskeletal:       Legs: Neurological: He is alert and oriented to person, place, and time.  Skin: Skin is warm. There is erythema.  Psychiatric: He has a normal mood and affect. His behavior is normal. Judgment and thought content normal.      Assessment/Plan Excision of bilateral groin skin lesions.  [place], DO 07/26/2017, 3:48 PM

## 2017-07-30 ENCOUNTER — Encounter (HOSPITAL_BASED_OUTPATIENT_CLINIC_OR_DEPARTMENT_OTHER)
Admission: RE | Admit: 2017-07-30 | Discharge: 2017-07-30 | Disposition: A | Discharge status: Home / Self Care | Payer: Medicare Other | Source: Ambulatory Visit | Attending: Plastic Surgery | Admitting: Plastic Surgery

## 2017-07-30 DIAGNOSIS — G8929 Other chronic pain: Secondary | ICD-10-CM | POA: Diagnosis not present

## 2017-07-30 DIAGNOSIS — C44599 Other specified malignant neoplasm of skin of other part of trunk: Secondary | ICD-10-CM | POA: Diagnosis not present

## 2017-07-30 DIAGNOSIS — Z950 Presence of cardiac pacemaker: Secondary | ICD-10-CM | POA: Diagnosis not present

## 2017-07-30 DIAGNOSIS — M109 Gout, unspecified: Secondary | ICD-10-CM | POA: Diagnosis not present

## 2017-07-30 DIAGNOSIS — M549 Dorsalgia, unspecified: Secondary | ICD-10-CM | POA: Diagnosis not present

## 2017-07-30 DIAGNOSIS — Z79899 Other long term (current) drug therapy: Secondary | ICD-10-CM | POA: Diagnosis not present

## 2017-07-30 DIAGNOSIS — Z8042 Family history of malignant neoplasm of prostate: Secondary | ICD-10-CM | POA: Diagnosis not present

## 2017-07-30 DIAGNOSIS — E785 Hyperlipidemia, unspecified: Secondary | ICD-10-CM | POA: Diagnosis not present

## 2017-07-30 DIAGNOSIS — L905 Scar conditions and fibrosis of skin: Secondary | ICD-10-CM | POA: Diagnosis not present

## 2017-07-30 DIAGNOSIS — Z87891 Personal history of nicotine dependence: Secondary | ICD-10-CM | POA: Diagnosis not present

## 2017-07-30 DIAGNOSIS — Z7901 Long term (current) use of anticoagulants: Secondary | ICD-10-CM | POA: Diagnosis not present

## 2017-07-30 DIAGNOSIS — I10 Essential (primary) hypertension: Secondary | ICD-10-CM | POA: Diagnosis not present

## 2017-07-30 DIAGNOSIS — Z800 Family history of malignant neoplasm of digestive organs: Secondary | ICD-10-CM | POA: Diagnosis not present

## 2017-07-30 DIAGNOSIS — G4733 Obstructive sleep apnea (adult) (pediatric): Secondary | ICD-10-CM | POA: Diagnosis not present

## 2017-07-30 DIAGNOSIS — Z803 Family history of malignant neoplasm of breast: Secondary | ICD-10-CM | POA: Diagnosis not present

## 2017-07-30 DIAGNOSIS — Z801 Family history of malignant neoplasm of trachea, bronchus and lung: Secondary | ICD-10-CM | POA: Diagnosis not present

## 2017-07-30 DIAGNOSIS — Z9889 Other specified postprocedural states: Secondary | ICD-10-CM | POA: Diagnosis not present

## 2017-07-30 DIAGNOSIS — I482 Chronic atrial fibrillation: Secondary | ICD-10-CM | POA: Diagnosis not present

## 2017-07-30 DIAGNOSIS — M479 Spondylosis, unspecified: Secondary | ICD-10-CM | POA: Diagnosis not present

## 2017-07-30 DIAGNOSIS — Z9049 Acquired absence of other specified parts of digestive tract: Secondary | ICD-10-CM | POA: Diagnosis not present

## 2017-07-30 LAB — BASIC METABOLIC PANEL
ANION GAP: 8 (ref 5–15)
BUN: 18 mg/dL (ref 6–20)
CALCIUM: 9.2 mg/dL (ref 8.9–10.3)
CO2: 28 mmol/L (ref 22–32)
Chloride: 104 mmol/L (ref 101–111)
Creatinine, Ser: 1.12 mg/dL (ref 0.61–1.24)
GFR calc Af Amer: >60 mL/min (ref >60)
GLUCOSE: 97 mg/dL (ref 65–99)
Potassium: 5.1 mmol/L (ref 3.5–5.1)
Sodium: 140 mmol/L (ref 135–145)

## 2017-07-30 LAB — PROTIME-INR
INR: 1.12
Prothrombin Time: 14.3 seconds (ref 11.4–15.2)

## 2017-07-31 ENCOUNTER — Other Ambulatory Visit: Payer: Self-pay

## 2017-07-31 ENCOUNTER — Encounter (HOSPITAL_BASED_OUTPATIENT_CLINIC_OR_DEPARTMENT_OTHER): Payer: Self-pay

## 2017-07-31 ENCOUNTER — Encounter (HOSPITAL_BASED_OUTPATIENT_CLINIC_OR_DEPARTMENT_OTHER)
Admission: RE | Disposition: A | Discharge status: Home / Self Care | Payer: Self-pay | Source: Ambulatory Visit | Attending: Plastic Surgery

## 2017-07-31 ENCOUNTER — Ambulatory Visit (HOSPITAL_BASED_OUTPATIENT_CLINIC_OR_DEPARTMENT_OTHER)
Admission: RE | Admit: 2017-07-31 | Discharge: 2017-07-31 | Disposition: A | Discharge status: Home / Self Care | Payer: Medicare Other | Source: Ambulatory Visit | Attending: Plastic Surgery | Admitting: Plastic Surgery

## 2017-07-31 ENCOUNTER — Ambulatory Visit (HOSPITAL_BASED_OUTPATIENT_CLINIC_OR_DEPARTMENT_OTHER): Payer: Medicare Other | Admitting: Anesthesiology

## 2017-07-31 DIAGNOSIS — L905 Scar conditions and fibrosis of skin: Secondary | ICD-10-CM | POA: Diagnosis not present

## 2017-07-31 DIAGNOSIS — C4499 Other specified malignant neoplasm of skin, unspecified: Secondary | ICD-10-CM

## 2017-07-31 DIAGNOSIS — Z801 Family history of malignant neoplasm of trachea, bronchus and lung: Secondary | ICD-10-CM | POA: Insufficient documentation

## 2017-07-31 DIAGNOSIS — G4733 Obstructive sleep apnea (adult) (pediatric): Secondary | ICD-10-CM | POA: Insufficient documentation

## 2017-07-31 DIAGNOSIS — C44599 Other specified malignant neoplasm of skin of other part of trunk: Secondary | ICD-10-CM | POA: Insufficient documentation

## 2017-07-31 DIAGNOSIS — M479 Spondylosis, unspecified: Secondary | ICD-10-CM | POA: Insufficient documentation

## 2017-07-31 DIAGNOSIS — L988 Other specified disorders of the skin and subcutaneous tissue: Secondary | ICD-10-CM | POA: Diagnosis not present

## 2017-07-31 DIAGNOSIS — Z7901 Long term (current) use of anticoagulants: Secondary | ICD-10-CM | POA: Diagnosis not present

## 2017-07-31 DIAGNOSIS — G8929 Other chronic pain: Secondary | ICD-10-CM | POA: Diagnosis not present

## 2017-07-31 DIAGNOSIS — Z800 Family history of malignant neoplasm of digestive organs: Secondary | ICD-10-CM | POA: Insufficient documentation

## 2017-07-31 DIAGNOSIS — L089 Local infection of the skin and subcutaneous tissue, unspecified: Secondary | ICD-10-CM | POA: Diagnosis not present

## 2017-07-31 DIAGNOSIS — M109 Gout, unspecified: Secondary | ICD-10-CM | POA: Insufficient documentation

## 2017-07-31 DIAGNOSIS — Z8042 Family history of malignant neoplasm of prostate: Secondary | ICD-10-CM | POA: Insufficient documentation

## 2017-07-31 DIAGNOSIS — Z87891 Personal history of nicotine dependence: Secondary | ICD-10-CM | POA: Insufficient documentation

## 2017-07-31 DIAGNOSIS — I4891 Unspecified atrial fibrillation: Secondary | ICD-10-CM | POA: Diagnosis not present

## 2017-07-31 DIAGNOSIS — Z9049 Acquired absence of other specified parts of digestive tract: Secondary | ICD-10-CM | POA: Insufficient documentation

## 2017-07-31 DIAGNOSIS — Z9889 Other specified postprocedural states: Secondary | ICD-10-CM | POA: Insufficient documentation

## 2017-07-31 DIAGNOSIS — L989 Disorder of the skin and subcutaneous tissue, unspecified: Secondary | ICD-10-CM

## 2017-07-31 DIAGNOSIS — M549 Dorsalgia, unspecified: Secondary | ICD-10-CM | POA: Diagnosis not present

## 2017-07-31 DIAGNOSIS — I482 Chronic atrial fibrillation: Secondary | ICD-10-CM | POA: Diagnosis not present

## 2017-07-31 DIAGNOSIS — Z79899 Other long term (current) drug therapy: Secondary | ICD-10-CM | POA: Insufficient documentation

## 2017-07-31 DIAGNOSIS — I10 Essential (primary) hypertension: Secondary | ICD-10-CM | POA: Diagnosis not present

## 2017-07-31 DIAGNOSIS — Z950 Presence of cardiac pacemaker: Secondary | ICD-10-CM | POA: Insufficient documentation

## 2017-07-31 DIAGNOSIS — E785 Hyperlipidemia, unspecified: Secondary | ICD-10-CM | POA: Insufficient documentation

## 2017-07-31 DIAGNOSIS — Z803 Family history of malignant neoplasm of breast: Secondary | ICD-10-CM | POA: Insufficient documentation

## 2017-07-31 DIAGNOSIS — C44509 Unspecified malignant neoplasm of skin of other part of trunk: Secondary | ICD-10-CM | POA: Diagnosis not present

## 2017-07-31 HISTORY — DX: Other specified malignant neoplasm of skin, unspecified: C44.99

## 2017-07-31 HISTORY — PX: LESION EXCISION WITH COMPLEX REPAIR: SHX6700

## 2017-07-31 SURGERY — LESION EXCISION WITH COMPLEX REPAIR
Anesthesia: General | Site: Groin | Laterality: Left

## 2017-07-31 MED ORDER — CEFAZOLIN SODIUM-DEXTROSE 2-4 GM/100ML-% IV SOLN
INTRAVENOUS | Status: AC
  Filled 2017-07-31: qty 100

## 2017-07-31 MED ORDER — ACETAMINOPHEN 325 MG PO TABS: 650.0000 mg | ORAL_TABLET | ORAL | Status: DC | PRN

## 2017-07-31 MED ORDER — MIDAZOLAM HCL 2 MG/2ML IJ SOLN: 1.0000 mg | INTRAMUSCULAR | Status: DC | PRN

## 2017-07-31 MED ORDER — PHENYLEPHRINE HCL 10 MG/ML IJ SOLN
INTRAMUSCULAR | Status: DC | PRN
  Administered 2017-07-31: 80 ug via INTRAVENOUS
  Administered 2017-07-31: 140 ug via INTRAVENOUS

## 2017-07-31 MED ORDER — FENTANYL CITRATE (PF) 100 MCG/2ML IJ SOLN: 50.0000 ug | INTRAMUSCULAR | Status: DC | PRN

## 2017-07-31 MED ORDER — SODIUM CHLORIDE 0.9% FLUSH: 3.0000 mL | INTRAVENOUS | Status: DC | PRN

## 2017-07-31 MED ORDER — BUPIVACAINE-EPINEPHRINE 0.25% -1:200000 IJ SOLN
INTRAMUSCULAR | Status: DC | PRN
  Administered 2017-07-31: 16.5 mL

## 2017-07-31 MED ORDER — FENTANYL CITRATE (PF) 100 MCG/2ML IJ SOLN
INTRAMUSCULAR | Status: AC
  Filled 2017-07-31: qty 2

## 2017-07-31 MED ORDER — PROPOFOL 10 MG/ML IV BOLUS
INTRAVENOUS | Status: DC | PRN
  Administered 2017-07-31: 100 mg via INTRAVENOUS

## 2017-07-31 MED ORDER — SODIUM CHLORIDE 0.9 % IV SOLN: 250.0000 mL | INTRAVENOUS | Status: DC | PRN

## 2017-07-31 MED ORDER — DEXAMETHASONE SODIUM PHOSPHATE 4 MG/ML IJ SOLN
INTRAMUSCULAR | Status: DC | PRN
  Administered 2017-07-31: 4 mg via INTRAVENOUS

## 2017-07-31 MED ORDER — SCOPOLAMINE 1 MG/3DAYS TD PT72: 1.0000 | MEDICATED_PATCH | Freq: Once | TRANSDERMAL | Status: DC | PRN

## 2017-07-31 MED ORDER — LIDOCAINE HCL (CARDIAC) 20 MG/ML IV SOLN
INTRAVENOUS | Status: DC | PRN
  Administered 2017-07-31: 50 mg via INTRAVENOUS

## 2017-07-31 MED ORDER — CEFAZOLIN SODIUM-DEXTROSE 2-4 GM/100ML-% IV SOLN
2.0000 g | INTRAVENOUS | Status: AC
  Administered 2017-07-31: 2 g via INTRAVENOUS

## 2017-07-31 MED ORDER — LACTATED RINGERS IV SOLN
INTRAVENOUS | Status: DC
  Administered 2017-07-31 (×2): via INTRAVENOUS

## 2017-07-31 MED ORDER — PROPOFOL 10 MG/ML IV BOLUS
INTRAVENOUS | Status: AC
  Filled 2017-07-31: qty 20

## 2017-07-31 MED ORDER — SODIUM CHLORIDE 0.9% FLUSH: 3.0000 mL | Freq: Two times a day (BID) | INTRAVENOUS | Status: DC

## 2017-07-31 MED ORDER — ACETAMINOPHEN 650 MG RE SUPP: 650.0000 mg | RECTAL | Status: DC | PRN

## 2017-07-31 SURGICAL SUPPLY — 54 items
BLADE CLIPPER SURG (BLADE) IMPLANT
BLADE SURG 15 STRL LF DISP TIS (BLADE) ×2 IMPLANT
BLADE SURG 15 STRL SS (BLADE) ×2
CHLORAPREP W/TINT 26ML (MISCELLANEOUS) ×4 IMPLANT
CLOSURE WOUND 1/2 X4 (GAUZE/BANDAGES/DRESSINGS)
COVER BACK TABLE 60X90IN (DRAPES) ×4 IMPLANT
COVER MAYO STAND STRL (DRAPES) ×4 IMPLANT
DERMABOND ADVANCED (GAUZE/BANDAGES/DRESSINGS) ×2
DERMABOND ADVANCED .7 DNX12 (GAUZE/BANDAGES/DRESSINGS) ×2 IMPLANT
DRAPE LAPAROTOMY 100X72 PEDS (DRAPES) ×4 IMPLANT
DRAPE U-SHAPE 76X120 STRL (DRAPES) IMPLANT
DRSG TEGADERM 2-3/8X2-3/4 SM (GAUZE/BANDAGES/DRESSINGS) IMPLANT
ELECT NEEDLE BLADE 2-5/6 (NEEDLE) IMPLANT
ELECT REM PT RETURN 9FT ADLT (ELECTROSURGICAL) ×4
ELECT REM PT RETURN 9FT PED (ELECTROSURGICAL)
ELECTRODE REM PT RETRN 9FT PED (ELECTROSURGICAL) IMPLANT
ELECTRODE REM PT RTRN 9FT ADLT (ELECTROSURGICAL) ×2 IMPLANT
GAUZE SPONGE 4X4 12PLY STRL LF (GAUZE/BANDAGES/DRESSINGS) IMPLANT
GAUZE XEROFORM 1X8 LF (GAUZE/BANDAGES/DRESSINGS) IMPLANT
GLOVE BIO SURGEON STRL SZ 6.5 (GLOVE) ×3 IMPLANT
GLOVE BIO SURGEON STRL SZ7 (GLOVE) ×4 IMPLANT
GLOVE BIO SURGEONS STRL SZ 6.5 (GLOVE) ×1
GLOVE BIOGEL PI IND STRL 7.0 (GLOVE) ×2 IMPLANT
GLOVE BIOGEL PI INDICATOR 7.0 (GLOVE) ×2
GLOVE ECLIPSE 6.5 STRL STRAW (GLOVE) ×4 IMPLANT
GOWN STRL REUS W/ TWL LRG LVL3 (GOWN DISPOSABLE) ×4 IMPLANT
GOWN STRL REUS W/ TWL XL LVL3 (GOWN DISPOSABLE) ×2 IMPLANT
GOWN STRL REUS W/TWL LRG LVL3 (GOWN DISPOSABLE) ×4
GOWN STRL REUS W/TWL XL LVL3 (GOWN DISPOSABLE) ×2
NEEDLE HYPO 30GX1 BEV (NEEDLE) ×4 IMPLANT
NEEDLE PRECISIONGLIDE 27X1.5 (NEEDLE) IMPLANT
NS IRRIG 1000ML POUR BTL (IV SOLUTION) IMPLANT
PACK BASIN DAY SURGERY FS (CUSTOM PROCEDURE TRAY) ×4 IMPLANT
PENCIL BUTTON HOLSTER BLD 10FT (ELECTRODE) ×4 IMPLANT
PUNCH BIOPSY DERMAL 2MM (MISCELLANEOUS) IMPLANT
PUNCH BIOPSY DERMAL 3MM (MISCELLANEOUS) IMPLANT
PUNCH BIOPSY DERMAL 4MM (MISCELLANEOUS) IMPLANT
PUNCH BIOPSY DERMAL 5MM STRL (MISCELLANEOUS) IMPLANT
SHEET MEDIUM DRAPE 40X70 STRL (DRAPES) IMPLANT
SPONGE GAUZE 2X2 8PLY STER LF (GAUZE/BANDAGES/DRESSINGS)
SPONGE GAUZE 2X2 8PLY STRL LF (GAUZE/BANDAGES/DRESSINGS) IMPLANT
STRIP CLOSURE SKIN 1/2X4 (GAUZE/BANDAGES/DRESSINGS) IMPLANT
SUT MNCRL 6-0 UNDY P1 1X18 (SUTURE) IMPLANT
SUT MNCRL AB 4-0 PS2 18 (SUTURE) ×12 IMPLANT
SUT MON AB 3-0 SH 27 (SUTURE) ×4
SUT MON AB 3-0 SH27 (SUTURE) ×4 IMPLANT
SUT MON AB 5-0 P3 18 (SUTURE) ×4 IMPLANT
SUT MONOCRYL 6-0 P1 1X18 (SUTURE)
SUT PROLENE 6 0 P 1 18 (SUTURE) IMPLANT
SUT SILK 3 0 SH 30 (SUTURE) ×4 IMPLANT
SUT VIC AB 5-0 P-3 18X BRD (SUTURE) IMPLANT
SUT VIC AB 5-0 P3 18 (SUTURE)
SUT VICRYL 4-0 PS2 18IN ABS (SUTURE) IMPLANT
SYR CONTROL 10ML LL (SYRINGE) ×4 IMPLANT

## 2017-07-31 NOTE — Anesthesia Preprocedure Evaluation (Signed)
Anesthesia Evaluation  Patient identified by MRN, date of birth, ID band Patient awake    Reviewed: Allergy & Precautions, NPO status , Patient's Chart, lab work & pertinent test results, reviewed documented beta blocker date and time   Airway Mallampati: II  TM Distance: >3 FB Neck ROM: Full    Dental  (+) Teeth Intact, Dental Advisory Given, Caps   Pulmonary sleep apnea and Continuous Positive Airway Pressure Ventilation , former smoker,    Pulmonary exam normal breath sounds clear to auscultation       Cardiovascular hypertension, Pt. on home beta blockers and Pt. on medications + Peripheral Vascular Disease  + dysrhythmias Atrial Fibrillation + pacemaker  Rhythm:Irregular Rate:Normal     Neuro/Psych negative neurological ROS  negative psych ROS   GI/Hepatic negative GI ROS, Neg liver ROS,   Endo/Other  Obesity   Renal/GU negative Renal ROS     Musculoskeletal  (+) Arthritis , Osteoarthritis,    Abdominal   Peds  Hematology  (+) Blood dyscrasia (Coumadin), ,   Anesthesia Other Findings Day of surgery medications reviewed with the patient.  Reproductive/Obstetrics                             Anesthesia Physical  Anesthesia Plan  ASA: III  Anesthesia Plan: General   Post-op Pain Management:    Induction: Intravenous  PONV Risk Score and Plan: 2 and Ondansetron and Dexamethasone  Airway Management Planned: LMA  Additional Equipment:   Intra-op Plan:   Post-operative Plan: Extubation in OR  Informed Consent: I have reviewed the patients History and Physical, chart, labs and discussed the procedure including the risks, benefits and alternatives for the proposed anesthesia with the patient or authorized representative who has indicated his/her understanding and acceptance.   Dental advisory given  Plan Discussed with: CRNA  Anesthesia Plan Comments: (Risks/benefits of  general anesthesia discussed with patient including risk of damage to teeth, lips, gum, and tongue, nausea/vomiting, allergic reactions to medications, and the possibility of heart attack, stroke and death.  All patient questions answered.  Patient wishes to proceed.)        Anesthesia Quick Evaluation

## 2017-07-31 NOTE — Transfer of Care (Signed)
Immediate Anesthesia Transfer of Care Note  Patient: Cameron Sharp  Procedure(s) Performed: BIOPSY OF BILATERAL GROIN  CHANGING SKIN LESIONS (Bilateral Groin)  Patient Location: PACU  Anesthesia Type:General  Level of Consciousness: awake, alert  and drowsy  Airway & Oxygen Therapy: Patient Spontanous Breathing and Patient connected to face mask oxygen  Post-op Assessment: Report given to RN and Post -op Vital signs reviewed and stable  Post vital signs: Reviewed and stable  Last Vitals:  Vitals:   07/31/17 1056  BP: (!) 120/56  Pulse: 67  Resp: 18  Temp: (!) 36.4 C  SpO2: 98%    Last Pain:  Vitals:   07/31/17 1056  TempSrc: Oral         Complications: No apparent anesthesia complications

## 2017-07-31 NOTE — Discharge Instructions (Addendum)
May shower on Friday. No heavy lifting.      Post Anesthesia Home Care Instructions  Activity: Get plenty of rest for the remainder of the day. A responsible individual must stay with you for 24 hours following the procedure.  For the next 24 hours, DO NOT: -Drive a car -Paediatric nurse -Drink alcoholic beverages -Take any medication unless instructed by your physician -Make any legal decisions or sign important papers.  Meals: Start with liquid foods such as gelatin or soup. Progress to regular foods as tolerated. Avoid greasy, spicy, heavy foods. If nausea and/or vomiting occur, drink only clear liquids until the nausea and/or vomiting subsides. Call your physician if vomiting continues.  Special Instructions/Symptoms: Your throat may feel dry or sore from the anesthesia or the breathing tube placed in your throat during surgery. If this causes discomfort, gargle with warm salt water. The discomfort should disappear within 24 hours.  If you had a scopolamine patch placed behind your ear for the management of post- operative nausea and/or vomiting:  1. The medication in the patch is effective for 72 hours, after which it should be removed.  Wrap patch in a tissue and discard in the trash. Wash hands thoroughly with soap and water. 2. You may remove the patch earlier than 72 hours if you experience unpleasant side effects which may include dry mouth, dizziness or visual disturbances. 3. Avoid touching the patch. Wash your hands with soap and water after contact with the patch.

## 2017-07-31 NOTE — Anesthesia Procedure Notes (Signed)
Procedure Name: LMA Insertion Date/Time: 07/31/2017 12:15 PM Performed by: Willa Frater, CRNA Pre-anesthesia Checklist: Patient identified, Emergency Drugs available, Suction available and Patient being monitored Patient Re-evaluated:Patient Re-evaluated prior to induction Oxygen Delivery Method: Circle system utilized Preoxygenation: Pre-oxygenation with 100% oxygen Induction Type: IV induction Ventilation: Mask ventilation without difficulty LMA: LMA inserted LMA Size: 5.0 Number of attempts: 1 Airway Equipment and Method: Bite block Placement Confirmation: positive ETCO2 Tube secured with: Tape Dental Injury: Teeth and Oropharynx as per pre-operative assessment

## 2017-07-31 NOTE — Interval H&P Note (Signed)
History and Physical Interval Note:  07/31/2017 11:36 AM  Cameron Sharp  has presented today for surgery, with the diagnosis of MEMORY PADGET DISEASE  The various methods of treatment have been discussed with the patient and family. After consideration of risks, benefits and other options for treatment, the patient has consented to  Procedure(s): BIOPSY OF LEFT  GROIN  CHANGING OF SKIN LESION (Left) as a surgical intervention .  The patient's history has been reviewed, patient examined, no change in status, stable for surgery.  I have reviewed the patient's chart and labs.  Questions were answered to the patient's satisfaction.     [name]

## 2017-07-31 NOTE — Op Note (Signed)
DATE OF OPERATION: 07/31/2017  LOCATION: [facility]  PREOPERATIVE DIAGNOSIS: Right groin extramammary paget's disease x 2, left groin changing skin lesion  POSTOPERATIVE DIAGNOSIS: Same  PROCEDURE: Excision and layered closure of following: 1. Right groin paget's disease distal: 1.5 x 6 cm 2. Right groin paget's disease superior: 1.5 x 3 cm 3. Left groin changing skin lesion: 2 x 5 cm  SURGEON: Shakila Mak Sanger Selin Eisler, DO  EBL: 5 cc  CONDITION: Stable  COMPLICATIONS: None  INDICATION: The patient, Cameron Sharp, is a 82 y.o. male born on 1935/12/07, is here for treatment of a positive margin on the right groin after excision of extramammary paget's disease.  He has a changing skin lesion on the left as well.   PROCEDURE DETAILS:  The patient was seen prior to surgery and marked.  The IV antibiotics were given. The patient was taken to the operating room and given a general anesthetic. A standard time out was performed and all information was confirmed by those in the room. SCDs were placed.   The groin was prepped and draped on both sides.  The local was injected for intraoperative hemostasis and postoperative pain control.  The bovie with the needle tip was used to excise the right groin scar for a 1.5 x 6 cm excision at the inferior portion of the previous incision.  This was to include the positive margin from the previous excision. The short stitch was placed at 12 o'clock and long stitch at the 9 o'clock position.  The deep layers were closed with the 3-0 Monocryl followed by the 4-0 and 5-0 Monocryl.  There was an superior area of 1.5 x 3 cm skin break down.  This was excised with the needle tip bovie and then an additional margin.  This area was closed with the 3-0 Monocryl followed by the 4-0 Monocryl.  Derma bond was applied.  Attention was turned to the left groin.  The needle tip bovie was used to excise the 2 x 5 cm area.  The deep layers were closed with the 3-0  Monocryl followed by the 4-0 Monocryl.  The short stitch was placed at 12 o'clock and the long stitch at 3 o'clock.  Derma bond was applied.  The patient was allowed to wake up and taken to recovery room in stable condition at the end of the case. The family was notified at the end of the case.

## 2017-07-31 NOTE — Anesthesia Postprocedure Evaluation (Signed)
Anesthesia Post Note  Patient: Gavin Telford  Procedure(s) Performed: BIOPSY OF BILATERAL GROIN  CHANGING SKIN LESIONS (Bilateral Groin)     Patient location during evaluation: PACU Anesthesia Type: General Level of consciousness: awake and alert Pain management: pain level controlled Vital Signs Assessment: post-procedure vital signs reviewed and stable Respiratory status: spontaneous breathing, nonlabored ventilation and respiratory function stable Cardiovascular status: blood pressure returned to baseline and stable Postop Assessment: no apparent nausea or vomiting Anesthetic complications: no    Last Vitals:  Vitals:   07/31/17 1415 07/31/17 1450  BP:  127/61  Pulse: 60 63  Resp: 19 18  Temp:  36.4 C  SpO2: 100% 100%    Last Pain:  Vitals:   07/31/17 1450  TempSrc: Oral  PainSc: 2                  Catalina Gravel

## 2017-08-01 ENCOUNTER — Encounter (HOSPITAL_BASED_OUTPATIENT_CLINIC_OR_DEPARTMENT_OTHER): Payer: Self-pay | Admitting: Plastic Surgery

## 2017-08-01 NOTE — Addendum Note (Signed)
Addendum  created 08/01/17 1227 by Jonay Hitchcock, Ernesta Amble, CRNA   Charge Capture section accepted

## 2017-08-07 ENCOUNTER — Other Ambulatory Visit: Payer: Self-pay | Admitting: Cardiovascular Disease

## 2017-08-08 DIAGNOSIS — B029 Zoster without complications: Secondary | ICD-10-CM | POA: Diagnosis not present

## 2017-08-13 DIAGNOSIS — Z79899 Other long term (current) drug therapy: Secondary | ICD-10-CM | POA: Diagnosis not present

## 2017-08-13 DIAGNOSIS — B029 Zoster without complications: Secondary | ICD-10-CM | POA: Diagnosis not present

## 2017-08-13 DIAGNOSIS — Z7901 Long term (current) use of anticoagulants: Secondary | ICD-10-CM | POA: Diagnosis not present

## 2017-08-13 DIAGNOSIS — C44599 Other specified malignant neoplasm of skin of other part of trunk: Secondary | ICD-10-CM | POA: Diagnosis not present

## 2017-08-16 ENCOUNTER — Ambulatory Visit (INDEPENDENT_AMBULATORY_CARE_PROVIDER_SITE_OTHER): Payer: Medicare Other | Admitting: Pharmacist

## 2017-08-16 DIAGNOSIS — I482 Chronic atrial fibrillation: Secondary | ICD-10-CM | POA: Diagnosis not present

## 2017-08-16 DIAGNOSIS — Z7901 Long term (current) use of anticoagulants: Secondary | ICD-10-CM | POA: Diagnosis not present

## 2017-08-16 DIAGNOSIS — I4821 Permanent atrial fibrillation: Secondary | ICD-10-CM

## 2017-08-16 LAB — POCT INR: INR: 1.6

## 2017-08-22 DIAGNOSIS — B029 Zoster without complications: Secondary | ICD-10-CM | POA: Diagnosis not present

## 2017-09-04 DIAGNOSIS — G894 Chronic pain syndrome: Secondary | ICD-10-CM | POA: Diagnosis not present

## 2017-09-04 DIAGNOSIS — Z79899 Other long term (current) drug therapy: Secondary | ICD-10-CM | POA: Diagnosis not present

## 2017-09-04 DIAGNOSIS — M889 Osteitis deformans of unspecified bone: Secondary | ICD-10-CM | POA: Diagnosis not present

## 2017-09-04 DIAGNOSIS — M549 Dorsalgia, unspecified: Secondary | ICD-10-CM | POA: Diagnosis not present

## 2017-09-04 DIAGNOSIS — Z79891 Long term (current) use of opiate analgesic: Secondary | ICD-10-CM | POA: Diagnosis not present

## 2017-09-04 DIAGNOSIS — M47816 Spondylosis without myelopathy or radiculopathy, lumbar region: Secondary | ICD-10-CM | POA: Diagnosis not present

## 2017-09-17 ENCOUNTER — Ambulatory Visit (INDEPENDENT_AMBULATORY_CARE_PROVIDER_SITE_OTHER): Payer: Medicare Other | Admitting: Pharmacist Clinician (PhC)/ Clinical Pharmacy Specialist

## 2017-09-17 DIAGNOSIS — Z7901 Long term (current) use of anticoagulants: Secondary | ICD-10-CM | POA: Diagnosis not present

## 2017-09-17 DIAGNOSIS — C44599 Other specified malignant neoplasm of skin of other part of trunk: Secondary | ICD-10-CM | POA: Diagnosis not present

## 2017-09-17 DIAGNOSIS — Z952 Presence of prosthetic heart valve: Secondary | ICD-10-CM | POA: Diagnosis not present

## 2017-09-17 DIAGNOSIS — I482 Chronic atrial fibrillation: Secondary | ICD-10-CM

## 2017-09-17 DIAGNOSIS — I4821 Permanent atrial fibrillation: Secondary | ICD-10-CM

## 2017-09-17 LAB — POCT INR: INR: 2

## 2017-09-23 ENCOUNTER — Ambulatory Visit (INDEPENDENT_AMBULATORY_CARE_PROVIDER_SITE_OTHER): Payer: Medicare Other | Admitting: Pharmacist

## 2017-09-23 ENCOUNTER — Encounter (HOSPITAL_BASED_OUTPATIENT_CLINIC_OR_DEPARTMENT_OTHER): Payer: Self-pay | Admitting: *Deleted

## 2017-09-23 ENCOUNTER — Other Ambulatory Visit: Payer: Self-pay

## 2017-09-23 DIAGNOSIS — I482 Chronic atrial fibrillation: Secondary | ICD-10-CM

## 2017-09-23 DIAGNOSIS — Z7901 Long term (current) use of anticoagulants: Secondary | ICD-10-CM

## 2017-09-23 DIAGNOSIS — I4821 Permanent atrial fibrillation: Secondary | ICD-10-CM

## 2017-09-23 LAB — POCT INR: INR: 2.3

## 2017-09-23 NOTE — Progress Notes (Signed)
This will be the 6th surgery for Paget's Disease that the patient has had, last being 07-31-17 and all done at [facility]. There are notes in Epic from Dr Debby Bud that surgery will not interfere with pacemaker function. Patient's LD Coumadin was 09-22-17, he will come in 09-25-17 for PT/INR and BMET.

## 2017-09-24 ENCOUNTER — Ambulatory Visit: Payer: Self-pay | Admitting: Plastic Surgery

## 2017-09-24 DIAGNOSIS — C4499 Other specified malignant neoplasm of skin, unspecified: Secondary | ICD-10-CM

## 2017-09-24 NOTE — H&P (View-Only) (Signed)
Cameron Sharp is an 82 y.o. male.   Chief Complaint: extramammary paget's disease HPI: The patient is a 82 yo male here for follow up of his extra mammary paget's disease of bilateral groin. The last excision of the left groin had positive margins. Right groin was negative margins.  He developed shingles post operatively and was seen by dermatology for this and started on Valtrex. The shingles have now resolved.  He would like to go ahead and schedule for re-excision of the left groin paget's. He is on his usual warfarin and we will plan to hold for 5 days prior to surgery again.   Past Medical History:  Diagnosis Date  . Arthritis    back and neck  . Atrial fibrillation ([place]) 10/10/2012  . Chronic back pain   . Gout   . HTN (hypertension) 05/01/2013  . Hyperlipidemia   . Hypertension   . OSA on CPAP    wears CPAP nightly  . Pacemaker   . Pacemaker -[place] 2010 05/01/2013  . Paget disease, extramammary    left groin  . Permanent atrial fibrillation [facility])     Past Surgical History:  Procedure Laterality Date  . carpel tunnel surgery Right 2012  . CHOLECYSTECTOMY    . COLONOSCOPY    . CYST EXCISION Right 05/08/2017   Procedure: REEXCISION OF EXTRA MAMARY PAGETS DISEASE OF RIGHT GROIN;  Surgeon: [name], DO;  Location: [place];  Service: Plastics;  Laterality: Right;  . GALLBLADDER SURGERY  12/2006  . LESION EXCISION WITH COMPLEX REPAIR Bilateral 07/31/2017   Procedure: BIOPSY OF BILATERAL GROIN  CHANGING SKIN LESIONS;  Surgeon: [name], DO;  Location: [place];  Service: Plastics;  Laterality: Bilateral;  . LIPOMA EXCISION N/A 11/01/2016   Procedure: EXCISION OF SEBACEOUS CYST ON BACK;  Surgeon: [name], DO;  Location: [place];  Service: Plastics;  Laterality: N/A;  . MASS EXCISION N/A 11/01/2016   Procedure: EXCISION OF RIGHT GROIN SKIN LESION;  Surgeon: [name], DO;  Location: [place];  Service: Plastics;  Laterality: N/A;  . MASS EXCISION Right 11/15/2016   Procedure: EXCISION POSITIVE MARGIN RIGHT GROIN;  Surgeon: [name], DO;  Location: [place];  Service: Plastics;  Laterality: Right;  . MASS EXCISION Right 04/04/2017   Procedure: REEXCISION OF EXTRA MAMARY PAGETS SKIN DISEASE OF RIGHT GROIN;  Surgeon: [name], DO;  Location: [place];  Service: Plastics;  Laterality: Right;  . NM MYOCAR PERF WALL MOTION  05/22/07   no significant ischemia  . PERMANENT PACEMAKER INSERTION  01/09/2009   [facility]  . TONSILLECTOMY     age 89  . US ECHOCARDIOGRAPHY  11/24/07   mild LVH,LA & RA mod to severely dilated,mild mitral annular ca+,mild TR,mild Pulmonary hypertensio,AOV mod. sclerotic,mild AI w/root dilatation and ca+.    Family History  Problem Relation Age of Onset  . Stomach cancer Father   . Lung cancer Maternal Grandfather   . Breast cancer Paternal Grandmother   . Prostate cancer Paternal Grandfather    Social History:  reports that he quit smoking about 41 years ago. He has a 39.00 pack-year smoking history. he has never used smokeless tobacco. He reports that he drinks alcohol. He reports that he does not use drugs.  Allergies: No Known Allergies  No medications prior to admission.    Results for orders placed or performed in visit  on 09/23/17 (from the past 48 hour(s))  POCT INR     Status: None   Collection Time: 09/23/17 12:00 AM  Result Value Ref Range   INR 2.3     Comment: self tester   No results found.  Review of Systems  Constitutional: Negative.   HENT: Negative.   Eyes: Negative.   Respiratory: Negative.   Cardiovascular: Negative.   Gastrointestinal: Negative.   Genitourinary: Negative.   Musculoskeletal: Negative.   Skin: Negative.   Neurological: Negative.   Psychiatric/Behavioral: Negative.     Height 6\' 3"  (1.905 m), weight  123.4 kg (272 lb). Physical Exam  Constitutional: He is oriented to person, place, and time. He appears well-developed and well-nourished.  HENT:  Head: Normocephalic and atraumatic.  Eyes: EOM are normal. Pupils are equal, round, and reactive to light.  Cardiovascular: Normal rate.  Respiratory: Effort normal.  GI: Soft.  Musculoskeletal:       Legs: Neurological: He is alert and oriented to person, place, and time.  Skin: Skin is warm.  Psychiatric: He has a normal mood and affect. His behavior is normal. Judgment and thought content normal.     Assessment/Plan Plan excision of left groin extramammary paget's disease.  [place], DO 09/24/2017, 9:15 AM

## 2017-09-24 NOTE — H&P (Signed)
Cameron Sharp is an 82 y.o. male.   Chief Complaint: extramammary paget's disease HPI: The patient is a 82 yo male here for follow up of his extra mammary paget's disease of bilateral groin. The last excision of the left groin had positive margins. Right groin was negative margins.  He developed shingles post operatively and was seen by dermatology for this and started on Valtrex. The shingles have now resolved.  He would like to go ahead and schedule for re-excision of the left groin paget's. He is on his usual warfarin and we will plan to hold for 5 days prior to surgery again.   Past Medical History:  Diagnosis Date  . Arthritis    back and neck  . Atrial fibrillation ([place]) 10/10/2012  . Chronic back pain   . Gout   . HTN (hypertension) 05/01/2013  . Hyperlipidemia   . Hypertension   . OSA on CPAP    wears CPAP nightly  . Pacemaker   . Pacemaker -[place] 2010 05/01/2013  . Paget disease, extramammary    left groin  . Permanent atrial fibrillation [facility])     Past Surgical History:  Procedure Laterality Date  . carpel tunnel surgery Right 2012  . CHOLECYSTECTOMY    . COLONOSCOPY    . CYST EXCISION Right 05/08/2017   Procedure: REEXCISION OF EXTRA MAMARY PAGETS DISEASE OF RIGHT GROIN;  Surgeon: [name], DO;  Location: [place];  Service: Plastics;  Laterality: Right;  . GALLBLADDER SURGERY  12/2006  . LESION EXCISION WITH COMPLEX REPAIR Bilateral 07/31/2017   Procedure: BIOPSY OF BILATERAL GROIN  CHANGING SKIN LESIONS;  Surgeon: [name], DO;  Location: [place];  Service: Plastics;  Laterality: Bilateral;  . LIPOMA EXCISION N/A 11/01/2016   Procedure: EXCISION OF SEBACEOUS CYST ON BACK;  Surgeon: [name], DO;  Location: [place];  Service: Plastics;  Laterality: N/A;  . MASS EXCISION N/A 11/01/2016   Procedure: EXCISION OF RIGHT GROIN SKIN LESION;  Surgeon: [name], DO;  Location: [place];  Service: Plastics;  Laterality: N/A;  . MASS EXCISION Right 11/15/2016   Procedure: EXCISION POSITIVE MARGIN RIGHT GROIN;  Surgeon: [name], DO;  Location: [place];  Service: Plastics;  Laterality: Right;  . MASS EXCISION Right 04/04/2017   Procedure: REEXCISION OF EXTRA MAMARY PAGETS SKIN DISEASE OF RIGHT GROIN;  Surgeon: [name], DO;  Location: [place];  Service: Plastics;  Laterality: Right;  . NM MYOCAR PERF WALL MOTION  05/22/07   no significant ischemia  . PERMANENT PACEMAKER INSERTION  01/09/2009   [facility]  . TONSILLECTOMY     age 68  . US ECHOCARDIOGRAPHY  11/24/07   mild LVH,LA & RA mod to severely dilated,mild mitral annular ca+,mild TR,mild Pulmonary hypertensio,AOV mod. sclerotic,mild AI w/root dilatation and ca+.    Family History  Problem Relation Age of Onset  . Stomach cancer Father   . Lung cancer Maternal Grandfather   . Breast cancer Paternal Grandmother   . Prostate cancer Paternal Grandfather    Social History:  reports that he quit smoking about 41 years ago. He has a 39.00 pack-year smoking history. he has never used smokeless tobacco. He reports that he drinks alcohol. He reports that he does not use drugs.  Allergies: No Known Allergies  No medications prior to admission.    Results for orders placed or performed in visit  on 09/23/17 (from the past 48 hour(s))  POCT INR     Status: None   Collection Time: 09/23/17 12:00 AM  Result Value Ref Range   INR 2.3     Comment: self tester   No results found.  Review of Systems  Constitutional: Negative.   HENT: Negative.   Eyes: Negative.   Respiratory: Negative.   Cardiovascular: Negative.   Gastrointestinal: Negative.   Genitourinary: Negative.   Musculoskeletal: Negative.   Skin: Negative.   Neurological: Negative.   Psychiatric/Behavioral: Negative.     Height 6\' 3"  (1.905 m), weight  123.4 kg (272 lb). Physical Exam  Constitutional: He is oriented to person, place, and time. He appears well-developed and well-nourished.  HENT:  Head: Normocephalic and atraumatic.  Eyes: EOM are normal. Pupils are equal, round, and reactive to light.  Cardiovascular: Normal rate.  Respiratory: Effort normal.  GI: Soft.  Musculoskeletal:       Legs: Neurological: He is alert and oriented to person, place, and time.  Skin: Skin is warm.  Psychiatric: He has a normal mood and affect. His behavior is normal. Judgment and thought content normal.     Assessment/Plan Plan excision of left groin extramammary paget's disease.  [place], DO 09/24/2017, 9:15 AM

## 2017-09-25 ENCOUNTER — Encounter (HOSPITAL_BASED_OUTPATIENT_CLINIC_OR_DEPARTMENT_OTHER)
Admission: RE | Admit: 2017-09-25 | Discharge: 2017-09-25 | Disposition: A | Discharge status: Home / Self Care | Payer: Medicare Other | Source: Ambulatory Visit | Attending: Plastic Surgery | Admitting: Plastic Surgery

## 2017-09-25 DIAGNOSIS — Z87891 Personal history of nicotine dependence: Secondary | ICD-10-CM | POA: Diagnosis not present

## 2017-09-25 DIAGNOSIS — I482 Chronic atrial fibrillation: Secondary | ICD-10-CM | POA: Diagnosis not present

## 2017-09-25 DIAGNOSIS — C44599 Other specified malignant neoplasm of skin of other part of trunk: Secondary | ICD-10-CM | POA: Diagnosis not present

## 2017-09-25 DIAGNOSIS — G4733 Obstructive sleep apnea (adult) (pediatric): Secondary | ICD-10-CM | POA: Diagnosis not present

## 2017-09-25 DIAGNOSIS — I10 Essential (primary) hypertension: Secondary | ICD-10-CM | POA: Diagnosis not present

## 2017-09-25 DIAGNOSIS — E785 Hyperlipidemia, unspecified: Secondary | ICD-10-CM | POA: Diagnosis not present

## 2017-09-25 LAB — BASIC METABOLIC PANEL
ANION GAP: 10 (ref 5–15)
BUN: 20 mg/dL (ref 6–20)
CO2: 27 mmol/L (ref 22–32)
Calcium: 9.5 mg/dL (ref 8.9–10.3)
Chloride: 104 mmol/L (ref 101–111)
Creatinine, Ser: 1.06 mg/dL (ref 0.61–1.24)
GFR calc Af Amer: >60 mL/min (ref >60)
Glucose, Bld: 103 mg/dL — ABNORMAL HIGH (ref 65–99)
POTASSIUM: 4.5 mmol/L (ref 3.5–5.1)
SODIUM: 141 mmol/L (ref 135–145)

## 2017-09-25 LAB — PROTIME-INR
INR: 1.33
PROTHROMBIN TIME: 16.4 s — AB (ref 11.4–15.2)

## 2017-09-26 ENCOUNTER — Encounter (HOSPITAL_BASED_OUTPATIENT_CLINIC_OR_DEPARTMENT_OTHER)
Admission: RE | Disposition: A | Discharge status: Home / Self Care | Payer: Self-pay | Source: Ambulatory Visit | Attending: Plastic Surgery

## 2017-09-26 ENCOUNTER — Ambulatory Visit (HOSPITAL_BASED_OUTPATIENT_CLINIC_OR_DEPARTMENT_OTHER)
Admission: RE | Admit: 2017-09-26 | Discharge: 2017-09-26 | Disposition: A | Discharge status: Home / Self Care | Payer: Medicare Other | Source: Ambulatory Visit | Attending: Plastic Surgery | Admitting: Plastic Surgery

## 2017-09-26 ENCOUNTER — Ambulatory Visit (HOSPITAL_BASED_OUTPATIENT_CLINIC_OR_DEPARTMENT_OTHER): Payer: Medicare Other | Admitting: Anesthesiology

## 2017-09-26 ENCOUNTER — Encounter (HOSPITAL_BASED_OUTPATIENT_CLINIC_OR_DEPARTMENT_OTHER): Payer: Self-pay | Admitting: *Deleted

## 2017-09-26 ENCOUNTER — Other Ambulatory Visit: Payer: Self-pay

## 2017-09-26 DIAGNOSIS — I10 Essential (primary) hypertension: Secondary | ICD-10-CM | POA: Insufficient documentation

## 2017-09-26 DIAGNOSIS — Z87891 Personal history of nicotine dependence: Secondary | ICD-10-CM | POA: Diagnosis not present

## 2017-09-26 DIAGNOSIS — I482 Chronic atrial fibrillation: Secondary | ICD-10-CM | POA: Insufficient documentation

## 2017-09-26 DIAGNOSIS — G4733 Obstructive sleep apnea (adult) (pediatric): Secondary | ICD-10-CM | POA: Diagnosis not present

## 2017-09-26 DIAGNOSIS — E785 Hyperlipidemia, unspecified: Secondary | ICD-10-CM | POA: Insufficient documentation

## 2017-09-26 DIAGNOSIS — C4490 Unspecified malignant neoplasm of skin, unspecified: Secondary | ICD-10-CM | POA: Diagnosis not present

## 2017-09-26 DIAGNOSIS — I481 Persistent atrial fibrillation: Secondary | ICD-10-CM | POA: Diagnosis not present

## 2017-09-26 DIAGNOSIS — C44599 Other specified malignant neoplasm of skin of other part of trunk: Secondary | ICD-10-CM | POA: Insufficient documentation

## 2017-09-26 DIAGNOSIS — C44509 Unspecified malignant neoplasm of skin of other part of trunk: Secondary | ICD-10-CM | POA: Diagnosis not present

## 2017-09-26 DIAGNOSIS — C4499 Other specified malignant neoplasm of skin, unspecified: Secondary | ICD-10-CM

## 2017-09-26 HISTORY — PX: EXCISION MASS LOWER EXTREMETIES: SHX6705

## 2017-09-26 SURGERY — EXCISION MASS LOWER EXTREMITIES
Anesthesia: General | Site: Groin | Laterality: Left

## 2017-09-26 MED ORDER — BUPIVACAINE-EPINEPHRINE 0.25% -1:200000 IJ SOLN
INTRAMUSCULAR | Status: DC | PRN
  Administered 2017-09-26: 16 mL

## 2017-09-26 MED ORDER — FENTANYL CITRATE (PF) 100 MCG/2ML IJ SOLN
INTRAMUSCULAR | Status: AC
  Filled 2017-09-26: qty 2

## 2017-09-26 MED ORDER — ONDANSETRON HCL 4 MG/2ML IJ SOLN
INTRAMUSCULAR | Status: AC
  Filled 2017-09-26: qty 2

## 2017-09-26 MED ORDER — CEFAZOLIN SODIUM-DEXTROSE 2-4 GM/100ML-% IV SOLN
2.0000 g | INTRAVENOUS | Status: AC
  Administered 2017-09-26: 2 g via INTRAVENOUS

## 2017-09-26 MED ORDER — PHENYLEPHRINE 40 MCG/ML (10ML) SYRINGE FOR IV PUSH (FOR BLOOD PRESSURE SUPPORT)
PREFILLED_SYRINGE | INTRAVENOUS | Status: AC
  Filled 2017-09-26: qty 10

## 2017-09-26 MED ORDER — FENTANYL CITRATE (PF) 100 MCG/2ML IJ SOLN: 50.0000 ug | INTRAMUSCULAR | Status: DC | PRN

## 2017-09-26 MED ORDER — HYDROCODONE-ACETAMINOPHEN 7.5-325 MG PO TABS: 1.0000 | ORAL_TABLET | Freq: Once | ORAL | Status: DC | PRN

## 2017-09-26 MED ORDER — LIDOCAINE 2% (20 MG/ML) 5 ML SYRINGE
INTRAMUSCULAR | Status: DC | PRN
  Administered 2017-09-26: 80 mg via INTRAVENOUS

## 2017-09-26 MED ORDER — OXYCODONE HCL 5 MG PO TABS: 5.0000 mg | ORAL_TABLET | ORAL | Status: DC | PRN

## 2017-09-26 MED ORDER — PHENYLEPHRINE HCL 10 MG/ML IJ SOLN
INTRAMUSCULAR | Status: DC | PRN
  Administered 2017-09-26: 120 ug via INTRAVENOUS
  Administered 2017-09-26: 80 ug via INTRAVENOUS

## 2017-09-26 MED ORDER — SUCCINYLCHOLINE CHLORIDE 200 MG/10ML IV SOSY
PREFILLED_SYRINGE | INTRAVENOUS | Status: AC
  Filled 2017-09-26: qty 10

## 2017-09-26 MED ORDER — MEPERIDINE HCL 25 MG/ML IJ SOLN: 6.2500 mg | INTRAMUSCULAR | Status: DC | PRN

## 2017-09-26 MED ORDER — DEXAMETHASONE SODIUM PHOSPHATE 4 MG/ML IJ SOLN
INTRAMUSCULAR | Status: DC | PRN
  Administered 2017-09-26: 10 mg via INTRAVENOUS

## 2017-09-26 MED ORDER — EPHEDRINE 5 MG/ML INJ
INTRAVENOUS | Status: AC
  Filled 2017-09-26: qty 10

## 2017-09-26 MED ORDER — MIDAZOLAM HCL 2 MG/2ML IJ SOLN: 1.0000 mg | INTRAMUSCULAR | Status: DC | PRN

## 2017-09-26 MED ORDER — HYDROCODONE-ACETAMINOPHEN 5-325 MG PO TABS: 1.0000 | ORAL_TABLET | Freq: Four times a day (QID) | ORAL | 0 refills | Status: DC | PRN

## 2017-09-26 MED ORDER — SODIUM CHLORIDE 0.9 % IV SOLN: 250.0000 mL | INTRAVENOUS | Status: DC | PRN

## 2017-09-26 MED ORDER — LACTATED RINGERS IV SOLN
INTRAVENOUS | Status: DC
  Administered 2017-09-26: 10 mL/h via INTRAVENOUS

## 2017-09-26 MED ORDER — FENTANYL CITRATE (PF) 100 MCG/2ML IJ SOLN
25.0000 ug | INTRAMUSCULAR | Status: DC | PRN
  Administered 2017-09-26 (×2): 50 ug via INTRAVENOUS

## 2017-09-26 MED ORDER — SODIUM CHLORIDE 0.9% FLUSH: 3.0000 mL | INTRAVENOUS | Status: DC | PRN

## 2017-09-26 MED ORDER — SCOPOLAMINE 1 MG/3DAYS TD PT72: 1.0000 | MEDICATED_PATCH | Freq: Once | TRANSDERMAL | Status: DC | PRN

## 2017-09-26 MED ORDER — SODIUM CHLORIDE 0.9% FLUSH: 3.0000 mL | Freq: Two times a day (BID) | INTRAVENOUS | Status: DC

## 2017-09-26 MED ORDER — PROPOFOL 10 MG/ML IV BOLUS
INTRAVENOUS | Status: DC | PRN
  Administered 2017-09-26: 150 mg via INTRAVENOUS
  Administered 2017-09-26: 50 mg via INTRAVENOUS

## 2017-09-26 MED ORDER — DEXAMETHASONE SODIUM PHOSPHATE 10 MG/ML IJ SOLN
INTRAMUSCULAR | Status: AC
  Filled 2017-09-26: qty 1

## 2017-09-26 MED ORDER — CEFAZOLIN SODIUM-DEXTROSE 2-4 GM/100ML-% IV SOLN
INTRAVENOUS | Status: AC
Start: 2017-09-26 — End: 2017-09-26
  Filled 2017-09-26: qty 100

## 2017-09-26 SURGICAL SUPPLY — 75 items
BENZOIN TINCTURE PRP APPL 2/3 (GAUZE/BANDAGES/DRESSINGS) IMPLANT
BLADE CLIPPER SURG (BLADE) ×3 IMPLANT
BLADE SURG 15 STRL LF DISP TIS (BLADE) ×1 IMPLANT
BLADE SURG 15 STRL SS (BLADE) ×2
BNDG CONFORM 2 STRL LF (GAUZE/BANDAGES/DRESSINGS) IMPLANT
BNDG ELASTIC 2X5.8 VLCR STR LF (GAUZE/BANDAGES/DRESSINGS) IMPLANT
BRIEF STRETCH MATERNITY 2XLG (MISCELLANEOUS) ×3 IMPLANT
CANISTER SUCT 1200ML W/VALVE (MISCELLANEOUS) ×3 IMPLANT
CHLORAPREP W/TINT 26ML (MISCELLANEOUS) ×3 IMPLANT
CLEANER CAUTERY TIP 5X5 PAD (MISCELLANEOUS) IMPLANT
CLOSURE WOUND 1/2 X4 (GAUZE/BANDAGES/DRESSINGS)
CORD BIPOLAR FORCEPS 12FT (ELECTRODE) IMPLANT
COVER BACK TABLE 60X90IN (DRAPES) ×3 IMPLANT
COVER MAYO STAND STRL (DRAPES) IMPLANT
DECANTER SPIKE VIAL GLASS SM (MISCELLANEOUS) IMPLANT
DERMABOND ADVANCED (GAUZE/BANDAGES/DRESSINGS) ×2
DERMABOND ADVANCED .7 DNX12 (GAUZE/BANDAGES/DRESSINGS) ×1 IMPLANT
DRAPE LAPAROTOMY 100X72 PEDS (DRAPES) ×3 IMPLANT
DRAPE U-SHAPE 76X120 STRL (DRAPES) IMPLANT
DRSG PAD ABDOMINAL 8X10 ST (GAUZE/BANDAGES/DRESSINGS) ×3 IMPLANT
DRSG TEGADERM 2-3/8X2-3/4 SM (GAUZE/BANDAGES/DRESSINGS) IMPLANT
DRSG TEGADERM 4X4.75 (GAUZE/BANDAGES/DRESSINGS) IMPLANT
ELECT COATED BLADE 2.86 ST (ELECTRODE) ×3 IMPLANT
ELECT NEEDLE BLADE 2-5/6 (NEEDLE) ×3 IMPLANT
ELECT REM PT RETURN 9FT ADLT (ELECTROSURGICAL) ×3
ELECT REM PT RETURN 9FT PED (ELECTROSURGICAL)
ELECTRODE REM PT RETRN 9FT PED (ELECTROSURGICAL) IMPLANT
ELECTRODE REM PT RTRN 9FT ADLT (ELECTROSURGICAL) ×1 IMPLANT
GAUZE SPONGE 4X4 12PLY STRL LF (GAUZE/BANDAGES/DRESSINGS) IMPLANT
GLOVE BIO SURGEON STRL SZ 6.5 (GLOVE) ×4 IMPLANT
GLOVE BIO SURGEON STRL SZ7 (GLOVE) ×3 IMPLANT
GLOVE BIO SURGEONS STRL SZ 6.5 (GLOVE) ×2
GLOVE BIOGEL PI IND STRL 6.5 (GLOVE) ×1 IMPLANT
GLOVE BIOGEL PI INDICATOR 6.5 (GLOVE) ×2
GLOVE SURG SS PI 6.0 STRL IVOR (GLOVE) ×3 IMPLANT
GOWN STRL REUS W/ TWL LRG LVL3 (GOWN DISPOSABLE) ×3 IMPLANT
GOWN STRL REUS W/ TWL XL LVL3 (GOWN DISPOSABLE) ×1 IMPLANT
GOWN STRL REUS W/TWL LRG LVL3 (GOWN DISPOSABLE) ×6
GOWN STRL REUS W/TWL XL LVL3 (GOWN DISPOSABLE) ×2
NDL SAFETY ECLIPSE 18X1.5 (NEEDLE) ×1 IMPLANT
NEEDLE HYPO 18GX1.5 SHARP (NEEDLE) ×2
NEEDLE HYPO 30GX1 BEV (NEEDLE) IMPLANT
NEEDLE PRECISIONGLIDE 27X1.5 (NEEDLE) IMPLANT
NS IRRIG 1000ML POUR BTL (IV SOLUTION) ×3 IMPLANT
PACK BASIN DAY SURGERY FS (CUSTOM PROCEDURE TRAY) ×3 IMPLANT
PAD CLEANER CAUTERY TIP 5X5 (MISCELLANEOUS)
PENCIL BUTTON HOLSTER BLD 10FT (ELECTRODE) ×3 IMPLANT
RUBBERBAND STERILE (MISCELLANEOUS) IMPLANT
SHEET MEDIUM DRAPE 40X70 STRL (DRAPES) IMPLANT
SLEEVE SCD COMPRESS KNEE MED (MISCELLANEOUS) ×3 IMPLANT
SPONGE GAUZE 2X2 8PLY STER LF (GAUZE/BANDAGES/DRESSINGS)
SPONGE GAUZE 2X2 8PLY STRL LF (GAUZE/BANDAGES/DRESSINGS) IMPLANT
STRIP CLOSURE SKIN 1/2X4 (GAUZE/BANDAGES/DRESSINGS) IMPLANT
SUCTION FRAZIER HANDLE 10FR (MISCELLANEOUS)
SUCTION TUBE FRAZIER 10FR DISP (MISCELLANEOUS) IMPLANT
SUT MNCRL 6-0 UNDY P1 1X18 (SUTURE) IMPLANT
SUT MNCRL AB 3-0 PS2 18 (SUTURE) ×3 IMPLANT
SUT MNCRL AB 4-0 PS2 18 (SUTURE) ×3 IMPLANT
SUT MON AB 5-0 P3 18 (SUTURE) ×3 IMPLANT
SUT MON AB 5-0 PS2 18 (SUTURE) ×6 IMPLANT
SUT MONOCRYL 6-0 P1 1X18 (SUTURE)
SUT PROLENE 5 0 P 3 (SUTURE) IMPLANT
SUT PROLENE 5 0 PS 2 (SUTURE) IMPLANT
SUT PROLENE 6 0 P 1 18 (SUTURE) IMPLANT
SUT SILK 3 0 PS 1 (SUTURE) ×3 IMPLANT
SUT VIC AB 5-0 P-3 18X BRD (SUTURE) IMPLANT
SUT VIC AB 5-0 P3 18 (SUTURE)
SUT VIC AB 5-0 PS2 18 (SUTURE) IMPLANT
SUT VICRYL 4-0 PS2 18IN ABS (SUTURE) IMPLANT
SYR BULB 3OZ (MISCELLANEOUS) ×3 IMPLANT
SYR CONTROL 10ML LL (SYRINGE) ×3 IMPLANT
TOWEL OR 17X24 6PK STRL BLUE (TOWEL DISPOSABLE) ×6 IMPLANT
TRAY DSU PREP LF (CUSTOM PROCEDURE TRAY) IMPLANT
TUBE CONNECTING 20'X1/4 (TUBING) ×1
TUBE CONNECTING 20X1/4 (TUBING) ×2 IMPLANT

## 2017-09-26 NOTE — Discharge Instructions (Signed)
Keep area clean. No heavy lifting.    Call your surgeon if you experience:   1.  Fever over 101.0. 2.  Inability to urinate. 3.  Nausea and/or vomiting. 4.  Extreme swelling or bruising at the surgical site. 5.  Continued bleeding from the incision. 6.  Increased pain, redness or drainage from the incision. 7.  Problems related to your pain medication. 8.  Any problems and/or concerns    Post Anesthesia Home Care Instructions  Activity: Get plenty of rest for the remainder of the day. A responsible individual must stay with you for 24 hours following the procedure.  For the next 24 hours, DO NOT: -Drive a car -Paediatric nurse -Drink alcoholic beverages -Take any medication unless instructed by your physician -Make any legal decisions or sign important papers.  Meals: Start with liquid foods such as gelatin or soup. Progress to regular foods as tolerated. Avoid greasy, spicy, heavy foods. If nausea and/or vomiting occur, drink only clear liquids until the nausea and/or vomiting subsides. Call your physician if vomiting continues.  Special Instructions/Symptoms: Your throat may feel dry or sore from the anesthesia or the breathing tube placed in your throat during surgery. If this causes discomfort, gargle with warm salt water. The discomfort should disappear within 24 hours.  If you had a scopolamine patch placed behind your ear for the management of post- operative nausea and/or vomiting:  1. The medication in the patch is effective for 72 hours, after which it should be removed.  Wrap patch in a tissue and discard in the trash. Wash hands thoroughly with soap and water. 2. You may remove the patch earlier than 72 hours if you experience unpleasant side effects which may include dry mouth, dizziness or visual disturbances. 3. Avoid touching the patch. Wash your hands with soap and water after contact with the patch.

## 2017-09-26 NOTE — Interval H&P Note (Signed)
History and Physical Interval Note:  09/26/2017 11:15 AM  Cameron Sharp  has presented today for surgery, with the diagnosis of Paget disease, extra mammary  The various methods of treatment have been discussed with the patient and family. After consideration of risks, benefits and other options for treatment, the patient has consented to  Procedure(s): Re-Excision of left groin extra mammary paget's disease (Left) as a surgical intervention .  The patient's history has been reviewed, patient examined, no change in status, stable for surgery.  I have reviewed the patient's chart and labs.  Questions were answered to the patient's satisfaction.     [name]

## 2017-09-26 NOTE — Op Note (Signed)
DATE OF OPERATION: 09/26/2017  LOCATION: [facility]  PREOPERATIVE DIAGNOSIS: left groin extramammary paget's disease  POSTOPERATIVE DIAGNOSIS: Same  PROCEDURE: Excision of left groin extramammary paget's disease 4 x 14 cm  SURGEON: Dequavius Kuhner [place] Nateisha Moyd, DO  ASSISTANT: Shawn Rayburn, PA  EBL: 5 cc  CONDITION: Stable  COMPLICATIONS: None  INDICATION: The patient, Cameron Sharp, is a 82 y.o. male born on [birth year], is here for treatment of left groin extramammary paget's disease.   PROCEDURE DETAILS:  The patient was seen prior to surgery and marked.  The IV antibiotics were given. The patient was taken to the operating room and given a general anesthetic. A standard time out was performed and all information was confirmed by those in the room. SCDs were placed.   The left groin was prepped and draped.  The area was marked and local injected.  The #10 blade was used to make the incision around the 4 x 14 cm area and excise the lesion in an elliptical fashion.  Hemostasis was achieved with electrocautery. The deep layers were closed with 3-0 Monocryl followed by a running 4-0 Monocryl.  The skin was closed with 5-0 Monocryl.  Derma bond was applied. The specimen was sent to path with short stitch 12 o'clock and long stitch 3 o'clock.  The patient was allowed to wake up and taken to recovery room in stable condition at the end of the case. The family was notified at the end of the case.   The advanced practice practitioner assisted throughout the case.  The APP was essential in retraction and counter traction when needed to make the case progress smoothly.  The assistance was needed for blood control, tissue re-approximation and assisted with closure of the incision site.

## 2017-09-26 NOTE — Anesthesia Preprocedure Evaluation (Signed)
Anesthesia Evaluation  Patient identified by MRN, date of birth, ID band Patient awake    Reviewed: Allergy & Precautions, NPO status , Patient's Chart, lab work & pertinent test results, reviewed documented beta blocker date and time   Airway Mallampati: II  TM Distance: >3 FB Neck ROM: Full    Dental  (+) Teeth Intact, Dental Advisory Given, Caps   Pulmonary sleep apnea and Continuous Positive Airway Pressure Ventilation , former smoker,    Pulmonary exam normal breath sounds clear to auscultation       Cardiovascular hypertension, Pt. on home beta blockers and Pt. on medications + Peripheral Vascular Disease  + dysrhythmias Atrial Fibrillation + pacemaker  Rhythm:Irregular Rate:Normal     Neuro/Psych negative neurological ROS  negative psych ROS   GI/Hepatic negative GI ROS, Neg liver ROS,   Endo/Other  Obesity   Renal/GU negative Renal ROS     Musculoskeletal  (+) Arthritis , Osteoarthritis,    Abdominal   Peds  Hematology  (+) Blood dyscrasia (Coumadin), ,   Anesthesia Other Findings Day of surgery medications reviewed with the patient.  Reproductive/Obstetrics                             Anesthesia Physical  Anesthesia Plan  ASA: III  Anesthesia Plan: General   Post-op Pain Management:    Induction: Intravenous  PONV Risk Score and Plan: 2 and Ondansetron and Dexamethasone  Airway Management Planned: LMA  Additional Equipment:   Intra-op Plan:   Post-operative Plan: Extubation in OR  Informed Consent: I have reviewed the patients History and Physical, chart, labs and discussed the procedure including the risks, benefits and alternatives for the proposed anesthesia with the patient or authorized representative who has indicated his/her understanding and acceptance.   Dental advisory given  Plan Discussed with: CRNA  Anesthesia Plan Comments: (Risks/benefits of  general anesthesia discussed with patient including risk of damage to teeth, lips, gum, and tongue, nausea/vomiting, allergic reactions to medications, and the possibility of heart attack, stroke and death.  All patient questions answered.  Patient wishes to proceed.)        Anesthesia Quick Evaluation

## 2017-09-26 NOTE — Anesthesia Procedure Notes (Signed)
Procedure Name: LMA Insertion Date/Time: 09/26/2017 11:57 AM Performed by: Willa Frater, CRNA Pre-anesthesia Checklist: Patient identified, Emergency Drugs available, Suction available and Patient being monitored Patient Re-evaluated:Patient Re-evaluated prior to induction Oxygen Delivery Method: Circle system utilized Preoxygenation: Pre-oxygenation with 100% oxygen Induction Type: IV induction Ventilation: Mask ventilation without difficulty LMA: LMA inserted LMA Size: 5.0 Number of attempts: 1 Airway Equipment and Method: Bite block Placement Confirmation: positive ETCO2 Tube secured with: Tape Dental Injury: Teeth and Oropharynx as per pre-operative assessment

## 2017-09-26 NOTE — Transfer of Care (Signed)
Immediate Anesthesia Transfer of Care Note  Patient: Cameron Sharp  Procedure(s) Performed: Excision of left groin extra mammary Paget's disease (Left Groin)  Patient Location: PACU  Anesthesia Type:General  Level of Consciousness: awake, alert  and oriented  Airway & Oxygen Therapy: Patient Spontanous Breathing and Patient connected to face mask oxygen  Post-op Assessment: Report given to RN and Post -op Vital signs reviewed and stable  Post vital signs: Reviewed and stable  Last Vitals:  Vitals:   09/26/17 0947  BP: (!) 123/57  Pulse: 66  Resp: 20  Temp: 36.6 C  SpO2: 100%    Last Pain:  Vitals:   09/26/17 0947  TempSrc: Oral      Patients Stated Pain Goal: 3 (09/31/12 1624)  Complications: No apparent anesthesia complications

## 2017-09-27 ENCOUNTER — Encounter (HOSPITAL_BASED_OUTPATIENT_CLINIC_OR_DEPARTMENT_OTHER): Payer: Self-pay | Admitting: Plastic Surgery

## 2017-09-27 NOTE — Anesthesia Postprocedure Evaluation (Signed)
Anesthesia Post Note  Patient: Cameron Sharp  Procedure(s) Performed: Excision of left groin extra mammary Paget's disease (Left Groin)     Patient location during evaluation: PACU Anesthesia Type: General Level of consciousness: sedated and patient cooperative Pain management: pain level controlled Vital Signs Assessment: post-procedure vital signs reviewed and stable Respiratory status: spontaneous breathing Cardiovascular status: stable Anesthetic complications: no    Last Vitals:  Vitals:   09/26/17 1315 09/26/17 1339  BP: (!) 111/58 (!) 125/54  Pulse: (!) 59 61  Resp: (!) 23 18  Temp:  (!) 36.4 C  SpO2: 99% 100%    Last Pain:  Vitals:   09/27/17 1007  TempSrc:   PainSc: [place]

## 2017-10-08 DIAGNOSIS — C44599 Other specified malignant neoplasm of skin of other part of trunk: Secondary | ICD-10-CM | POA: Diagnosis not present

## 2017-10-09 ENCOUNTER — Encounter: Payer: Self-pay | Admitting: Cardiovascular Disease

## 2017-10-09 ENCOUNTER — Ambulatory Visit (INDEPENDENT_AMBULATORY_CARE_PROVIDER_SITE_OTHER): Payer: Medicare Other | Admitting: Cardiovascular Disease

## 2017-10-09 VITALS — BP 132/76 | HR 89 | Ht 75.0 in | Wt 273.2 lb

## 2017-10-09 DIAGNOSIS — Z950 Presence of cardiac pacemaker: Secondary | ICD-10-CM | POA: Diagnosis not present

## 2017-10-09 DIAGNOSIS — I4821 Permanent atrial fibrillation: Secondary | ICD-10-CM

## 2017-10-09 DIAGNOSIS — E6609 Other obesity due to excess calories: Secondary | ICD-10-CM

## 2017-10-09 DIAGNOSIS — Z6834 Body mass index (BMI) 34.0-34.9, adult: Secondary | ICD-10-CM | POA: Diagnosis not present

## 2017-10-09 DIAGNOSIS — I482 Chronic atrial fibrillation: Secondary | ICD-10-CM

## 2017-10-09 DIAGNOSIS — Z7901 Long term (current) use of anticoagulants: Secondary | ICD-10-CM | POA: Diagnosis not present

## 2017-10-09 DIAGNOSIS — G4733 Obstructive sleep apnea (adult) (pediatric): Secondary | ICD-10-CM | POA: Diagnosis not present

## 2017-10-09 DIAGNOSIS — I10 Essential (primary) hypertension: Secondary | ICD-10-CM | POA: Diagnosis not present

## 2017-10-09 NOTE — Progress Notes (Signed)
Cardiology Office Note    Date:  10/09/2017   ID:  Cameron Sharp, DOB 1935/12/24, MRN [id number]  PCP:  [name], MD  Cardiologist: Shelva Majestic, M.D.;  Sanda Klein, MD   Chief Complaint  Patient presents with  . Atrial Fibrillation  . Pacemaker Check    History of Present Illness:  Cameron Sharp is a 82 y.o. male with after with slow ventricular response and a single-chamber [employer] Zephyr pacemaker implanted in 2010, as well severe obesity, obstructive sleep apnea, hypertension and hyperlipidemia.   He recently underwent surgery for Paget's disease of the left groin area similar to an event he underwent on the right side required 5 different surgeries in the past.  His surgeon has called him back to tell him that additional resection will be necessary scheduled for another surgery April 2.  He still has some bleeding at the surgical site.  His wife is helping him pack the wound he has not yet restarted anticoagulation.  Has not had any interim neurological problems.  He walks with a cane, denies falls or injuries.  He denies edema, dyspnea, angina, syncope or palpitations.  Pacemaker interrogation shows normal device function. He has 71% ventricular pacing and there are no episodes of high ventricular rates. Heart rate histograms remain slightly blunted, appropriate for his activity level Estimated generator longevity is 5.25-6.5 years. Lead parameters are normal.   Past Medical History:  Diagnosis Date  . Arthritis    back and neck  . Atrial fibrillation ([place]) 10/10/2012  . Chronic back pain   . Gout   . HTN (hypertension) 05/01/2013  . Hyperlipidemia   . Hypertension   . OSA on CPAP    wears CPAP nightly  . Pacemaker   . Pacemaker -[place] 2010 05/01/2013  . Paget disease, extramammary    left groin  . Permanent atrial fibrillation [facility])     Past Surgical History:  Procedure Laterality Date  . carpel tunnel surgery Right 2012  .  CHOLECYSTECTOMY    . COLONOSCOPY    . CYST EXCISION Right 05/08/2017   Procedure: REEXCISION OF EXTRA MAMARY PAGETS DISEASE OF RIGHT GROIN;  Surgeon: [name], DO;  Location: [place];  Service: Plastics;  Laterality: Right;  . EXCISION MASS LOWER EXTREMETIES Left 09/26/2017   Procedure: Excision of left groin extra mammary Paget's disease;  Surgeon: [name], DO;  Location: [place];  Service: Plastics;  Laterality: Left;  . GALLBLADDER SURGERY  12/2006  . LESION EXCISION WITH COMPLEX REPAIR Bilateral 07/31/2017   Procedure: BIOPSY OF BILATERAL GROIN  CHANGING SKIN LESIONS;  Surgeon: [name], DO;  Location: [place];  Service: Plastics;  Laterality: Bilateral;  . LIPOMA EXCISION N/A 11/01/2016   Procedure: EXCISION OF SEBACEOUS CYST ON BACK;  Surgeon: [name], DO;  Location: [place];  Service: Plastics;  Laterality: N/A;  . MASS EXCISION N/A 11/01/2016   Procedure: EXCISION OF RIGHT GROIN SKIN LESION;  Surgeon: [name], DO;  Location: [place];  Service: Plastics;  Laterality: N/A;  . MASS EXCISION Right 11/15/2016   Procedure: EXCISION POSITIVE MARGIN RIGHT GROIN;  Surgeon: [name], DO;  Location: [place];  Service: Plastics;  Laterality: Right;  . MASS EXCISION Right 04/04/2017   Procedure: REEXCISION OF EXTRA MAMARY PAGETS SKIN DISEASE OF RIGHT GROIN;  Surgeon: [name], DO;  Location: [place];  Service: Clinical cytogeneticist;  Laterality: Right;  . NM MYOCAR PERF WALL MOTION  05/22/07   no significant ischemia  . PERMANENT PACEMAKER INSERTION  01/09/2009   [employer]ude  . TONSILLECTOMY     age 89  . US ECHOCARDIOGRAPHY  11/24/07   mild LVH,LA & RA mod to severely dilated,mild mitral annular ca+,mild TR,mild Pulmonary hypertensio,AOV mod. sclerotic,mild AI w/root dilatation and ca+.    Current  Medications: Outpatient Medications Prior to Visit  Medication Sig Dispense Refill  . atorvastatin (LIPITOR) 20 MG tablet TAKE 1 TABLET DAILY 90 tablet 3  . baclofen (LIORESAL) 10 MG tablet Take 1 tablet by mouth 2 (two) times daily.  0  . cephALEXin (KEFLEX) 500 MG capsule Take by mouth.    . COUMADIN 5 MG tablet TAKE 1 TABLET DAILY OR AS DIRECTED 90 tablet 3  . docusate sodium (COLACE) 100 MG capsule Take 100 mg by mouth 2 (two) times daily.    Marland Kitchen gabapentin (NEURONTIN) 300 MG capsule Take by mouth 3 (three) times daily. PATIENT TAKES 600MG  IN AM, 300MG  AT MID DAY, 600 AT BEDTIME    . HYDROcodone-acetaminophen (NORCO) 5-325 MG tablet Take 1 tablet by mouth every 6 (six) hours as needed for moderate pain. 20 tablet 0  . metoprolol tartrate (LOPRESSOR) 25 MG tablet Take 0.5 tablets (12.5 mg total) by mouth every morning. 45 tablet 3  . Thiamine HCl (VITAMIN B-1) 250 MG tablet Take 250 mg by mouth daily.    Marland Kitchen ULORIC 40 MG tablet Take 1 tablet (40 mg total) by mouth daily. 30 tablet 6  . valsartan-hydrochlorothiazide (DIOVAN-HCT) 160-12.5 MG tablet TAKE 1 TABLET DAILY 90 tablet 3   No facility-administered medications prior to visit.      Allergies:   Patient has no known allergies.   Social History   Socioeconomic History  . Marital status: Married    Spouse name: None  . Number of children: None  . Years of education: None  . Highest education level: None  Social Needs  . Financial resource strain: None  . Food insecurity - worry: None  . Food insecurity - inability: None  . Transportation needs - medical: None  . Transportation needs - non-medical: None  Occupational History  . None  Tobacco Use  . Smoking status: Former Smoker    Packs/day: 3.00    Years: 13.00    Pack years: 39.00    Last attempt to quit: 07/23/1976    Years since quitting: 41.2  . Smokeless tobacco: Never Used  Substance and Sexual Activity  . Alcohol use: Yes    Alcohol/week: 0.0 oz    Comment: 2oz  scotch daily  . Drug use: No  . Sexual activity: None  Other Topics Concern  . None  Social History Narrative  . None     Family History:  The patient's family history includes Breast cancer in his paternal grandmother; Lung cancer in his maternal grandfather; Prostate cancer in his paternal grandfather; Stomach cancer in his father.   ROS:   Please see the history of present illness.    ROS All other systems reviewed and are negative.   PHYSICAL EXAM:   VS:  BP 132/76   Pulse 89   Ht 6\' 3"  (1.905 m)   Wt 273 lb 3.2 oz (123.9 kg)   BMI 34.15 kg/m     General: Alert, oriented x3, no distress, obese Head: no evidence of trauma, PERRL, EOMI, no exophtalmos or lid lag, no myxedema, no xanthelasma; normal  ears, nose and oropharynx Neck: normal jugular venous pulsations and no hepatojugular reflux; brisk carotid pulses without delay and no carotid bruits Chest: clear to auscultation, no signs of consolidation by percussion or palpation, normal fremitus, symmetrical and full respiratory excursions Cardiovascular: normal position and quality of the apical impulse, regular rhythm, normal first and paradoxically split second heart sounds, no murmurs, rubs or gallops, healthy left subclavian pacemaker site Abdomen: no tenderness or distention, no masses by palpation, no abnormal pulsatility or arterial bruits, normal bowel sounds, no hepatosplenomegaly Extremities: no clubbing, cyanosis or edema; 2+ radial, ulnar and brachial pulses bilaterally; 2+ right femoral, posterior tibial and dorsalis pedis pulses; 2+ left femoral, posterior tibial and dorsalis pedis pulses; no subclavian or femoral bruits Neurological: grossly nonfocal Psych: Normal mood and affect   Wt Readings from Last 3 Encounters:  10/09/17 273 lb 3.2 oz (123.9 kg)  09/26/17 269 lb 9.6 oz (122.3 kg)  07/31/17 268 lb (121.6 kg)      Studies/Labs Reviewed:   EKG:  EKG is ordered today.  It shows atrial fibrillation,  generalized low voltage due to obesity, a single ventricular paced beat.  There is T wave inversion in leads V3-V6, 2, 3, aVF.  Normal QTC 438 ms Recent Labs: 10/30/2016: ALT 14 05/08/2017: Hemoglobin 14.3 09/25/2017: BUN 20; Creatinine, Ser 1.06; Potassium 4.5; Sodium 141   Lipid Panel    Component Value Date/Time   CHOL 173 10/25/2014 0838   TRIG 129 10/25/2014 0838   HDL 65 10/25/2014 0838   CHOLHDL 2.7 10/25/2014 0838   VLDL 26 10/25/2014 0838   LDLCALC 82 10/25/2014 0838    ASSESSMENT:    1. Permanent atrial fibrillation ([place])   2. Pacemaker      PLAN:  In order of problems listed above:  1. Afib: Ventricular pacing remains quite prevalent, but the average ventricular rates are little faster on the current low-dose of beta-blocker but might decide to discontinue it altogether at future appointments.   2. PM: Comprehensive device check shows normal function.. No reprogramming changes necessary. Unfortunately his device is not amenable to remote monitoring. Follow-up in office pacemaker checks every 6 months. 3. Warfarin: CHADSVasc 3 (age 31, HTN). Relatively low embolic risk, Currently off anticoagulation for surgery.  Having some bleeding.  We will not restart his warfarin until after his surgical procedure. 4. HTN: good control 5. OSA: He reports compliance with CPAP 6. Obesity: Losing weight slowly.  Encouraged him to keep up the good work.    Medication Adjustments/Labs and Tests Ordered: Current medicines are reviewed at length with the patient today.  Concerns regarding medicines are outlined above.  Medication changes, Labs and Tests ordered today are listed in the Patient Instructions below. Patient Instructions  Dr Sallyanne Kuster recommends that you schedule a follow-up appointment in 6 months. You will receive a reminder letter in the mail two months in advance. If you don't receive a letter, please call our office to schedule the follow-up appointment.  If you need a  refill on your cardiac medications before your next appointment, please call your pharmacy.    Signed, Sanda Klein, MD  10/09/2017 2:29 PM    [name], [place], [place]  [address] Phone: [phone]; Fax: [phone]

## 2017-10-09 NOTE — Patient Instructions (Signed)
Dr Croitoru recommends that you schedule a follow-up appointment in 6 months. You will receive a reminder letter in the mail two months in advance. If you don't receive a letter, please call our office to schedule the follow-up appointment.  If you need a refill on your cardiac medications before your next appointment, please call your pharmacy. 

## 2017-10-16 ENCOUNTER — Other Ambulatory Visit: Payer: Self-pay

## 2017-10-16 ENCOUNTER — Encounter (HOSPITAL_BASED_OUTPATIENT_CLINIC_OR_DEPARTMENT_OTHER): Payer: Self-pay | Admitting: *Deleted

## 2017-10-16 NOTE — Progress Notes (Signed)
Patient has been here multiple times for exc Paget's disease, last being 09-26-17. Pt is on Coumadin for chronic a-fib and has a pacemaker. He has been off of his Coumadin since 09-22-17, last PT/INR 09-25-17 16.4/1.33. He also had BMET 09-25-17. He saw his cardiologist Dr Sallyanne Kuster 10-09-17 and was cleared for surgery, note in Epic.

## 2017-10-18 ENCOUNTER — Ambulatory Visit: Payer: Self-pay | Admitting: Plastic Surgery

## 2017-10-18 DIAGNOSIS — C4499 Other specified malignant neoplasm of skin, unspecified: Secondary | ICD-10-CM

## 2017-10-21 ENCOUNTER — Ambulatory Visit: Payer: Self-pay | Admitting: Plastic Surgery

## 2017-10-22 ENCOUNTER — Ambulatory Visit (HOSPITAL_BASED_OUTPATIENT_CLINIC_OR_DEPARTMENT_OTHER)
Admission: RE | Admit: 2017-10-22 | Discharge: 2017-10-22 | Disposition: A | Discharge status: Home / Self Care | Payer: Medicare Other | Source: Ambulatory Visit | Attending: Plastic Surgery | Admitting: Plastic Surgery

## 2017-10-22 ENCOUNTER — Encounter (HOSPITAL_BASED_OUTPATIENT_CLINIC_OR_DEPARTMENT_OTHER)
Admission: RE | Disposition: A | Discharge status: Home / Self Care | Payer: Self-pay | Source: Ambulatory Visit | Attending: Plastic Surgery

## 2017-10-22 ENCOUNTER — Encounter (HOSPITAL_BASED_OUTPATIENT_CLINIC_OR_DEPARTMENT_OTHER): Payer: Self-pay | Admitting: Anesthesiology

## 2017-10-22 ENCOUNTER — Ambulatory Visit (HOSPITAL_BASED_OUTPATIENT_CLINIC_OR_DEPARTMENT_OTHER): Payer: Medicare Other | Admitting: Anesthesiology

## 2017-10-22 ENCOUNTER — Other Ambulatory Visit: Payer: Self-pay

## 2017-10-22 DIAGNOSIS — M479 Spondylosis, unspecified: Secondary | ICD-10-CM | POA: Diagnosis not present

## 2017-10-22 DIAGNOSIS — Z803 Family history of malignant neoplasm of breast: Secondary | ICD-10-CM | POA: Diagnosis not present

## 2017-10-22 DIAGNOSIS — G4730 Sleep apnea, unspecified: Secondary | ICD-10-CM | POA: Diagnosis not present

## 2017-10-22 DIAGNOSIS — M549 Dorsalgia, unspecified: Secondary | ICD-10-CM | POA: Insufficient documentation

## 2017-10-22 DIAGNOSIS — I10 Essential (primary) hypertension: Secondary | ICD-10-CM | POA: Insufficient documentation

## 2017-10-22 DIAGNOSIS — Z801 Family history of malignant neoplasm of trachea, bronchus and lung: Secondary | ICD-10-CM | POA: Diagnosis not present

## 2017-10-22 DIAGNOSIS — C44509 Unspecified malignant neoplasm of skin of other part of trunk: Secondary | ICD-10-CM | POA: Diagnosis not present

## 2017-10-22 DIAGNOSIS — Z6834 Body mass index (BMI) 34.0-34.9, adult: Secondary | ICD-10-CM | POA: Insufficient documentation

## 2017-10-22 DIAGNOSIS — C44599 Other specified malignant neoplasm of skin of other part of trunk: Secondary | ICD-10-CM | POA: Insufficient documentation

## 2017-10-22 DIAGNOSIS — E669 Obesity, unspecified: Secondary | ICD-10-CM | POA: Insufficient documentation

## 2017-10-22 DIAGNOSIS — C4499 Other specified malignant neoplasm of skin, unspecified: Secondary | ICD-10-CM

## 2017-10-22 DIAGNOSIS — Z950 Presence of cardiac pacemaker: Secondary | ICD-10-CM | POA: Diagnosis not present

## 2017-10-22 DIAGNOSIS — I4891 Unspecified atrial fibrillation: Secondary | ICD-10-CM | POA: Insufficient documentation

## 2017-10-22 DIAGNOSIS — I739 Peripheral vascular disease, unspecified: Secondary | ICD-10-CM | POA: Diagnosis not present

## 2017-10-22 DIAGNOSIS — M47892 Other spondylosis, cervical region: Secondary | ICD-10-CM | POA: Insufficient documentation

## 2017-10-22 DIAGNOSIS — Z800 Family history of malignant neoplasm of digestive organs: Secondary | ICD-10-CM | POA: Diagnosis not present

## 2017-10-22 DIAGNOSIS — Z87891 Personal history of nicotine dependence: Secondary | ICD-10-CM | POA: Insufficient documentation

## 2017-10-22 DIAGNOSIS — I482 Chronic atrial fibrillation: Secondary | ICD-10-CM | POA: Diagnosis not present

## 2017-10-22 DIAGNOSIS — Z8042 Family history of malignant neoplasm of prostate: Secondary | ICD-10-CM | POA: Diagnosis not present

## 2017-10-22 DIAGNOSIS — M1990 Unspecified osteoarthritis, unspecified site: Secondary | ICD-10-CM | POA: Diagnosis not present

## 2017-10-22 DIAGNOSIS — G8929 Other chronic pain: Secondary | ICD-10-CM | POA: Diagnosis not present

## 2017-10-22 DIAGNOSIS — G4733 Obstructive sleep apnea (adult) (pediatric): Secondary | ICD-10-CM | POA: Diagnosis not present

## 2017-10-22 DIAGNOSIS — E785 Hyperlipidemia, unspecified: Secondary | ICD-10-CM | POA: Diagnosis not present

## 2017-10-22 DIAGNOSIS — Z9049 Acquired absence of other specified parts of digestive tract: Secondary | ICD-10-CM | POA: Insufficient documentation

## 2017-10-22 DIAGNOSIS — M7989 Other specified soft tissue disorders: Secondary | ICD-10-CM | POA: Diagnosis not present

## 2017-10-22 HISTORY — PX: EXCISION MASS LOWER EXTREMETIES: SHX6705

## 2017-10-22 SURGERY — EXCISION MASS LOWER EXTREMITIES
Anesthesia: General | Site: Groin | Laterality: Left

## 2017-10-22 MED ORDER — HYDROCODONE-ACETAMINOPHEN 5-325 MG PO TABS
ORAL_TABLET | ORAL | Status: AC
  Filled 2017-10-22: qty 1

## 2017-10-22 MED ORDER — PROPOFOL 500 MG/50ML IV EMUL
INTRAVENOUS | Status: AC
  Filled 2017-10-22: qty 50

## 2017-10-22 MED ORDER — LACTATED RINGERS IV SOLN
INTRAVENOUS | Status: DC
  Administered 2017-10-22: 07:00:00 via INTRAVENOUS
  Administered 2017-10-22: 10 mL/h via INTRAVENOUS

## 2017-10-22 MED ORDER — ONDANSETRON HCL 4 MG/2ML IJ SOLN
INTRAMUSCULAR | Status: DC | PRN
  Administered 2017-10-22: 4 mg via INTRAVENOUS

## 2017-10-22 MED ORDER — ACETAMINOPHEN 650 MG RE SUPP: 650.0000 mg | RECTAL | Status: DC | PRN

## 2017-10-22 MED ORDER — DEXAMETHASONE SODIUM PHOSPHATE 4 MG/ML IJ SOLN
INTRAMUSCULAR | Status: DC | PRN
  Administered 2017-10-22: 10 mg via INTRAVENOUS

## 2017-10-22 MED ORDER — SODIUM CHLORIDE 0.9 % IV SOLN
250.0000 mL | INTRAVENOUS | Status: DC | PRN
Start: 2017-10-22 — End: 2017-10-22

## 2017-10-22 MED ORDER — LIDOCAINE HCL (CARDIAC) 20 MG/ML IV SOLN
INTRAVENOUS | Status: DC | PRN
  Administered 2017-10-22: 70 mg via INTRAVENOUS

## 2017-10-22 MED ORDER — FENTANYL CITRATE (PF) 100 MCG/2ML IJ SOLN
INTRAMUSCULAR | Status: AC
  Filled 2017-10-22: qty 2

## 2017-10-22 MED ORDER — BUPIVACAINE-EPINEPHRINE (PF) 0.5% -1:200000 IJ SOLN
INTRAMUSCULAR | Status: AC
  Filled 2017-10-22: qty 30

## 2017-10-22 MED ORDER — ONDANSETRON HCL 4 MG/2ML IJ SOLN
INTRAMUSCULAR | Status: AC
  Filled 2017-10-22: qty 2

## 2017-10-22 MED ORDER — FENTANYL CITRATE (PF) 100 MCG/2ML IJ SOLN: 25.0000 ug | INTRAMUSCULAR | Status: DC | PRN

## 2017-10-22 MED ORDER — LIDOCAINE HCL (CARDIAC) 20 MG/ML IV SOLN
INTRAVENOUS | Status: AC
  Filled 2017-10-22: qty 5

## 2017-10-22 MED ORDER — DEXAMETHASONE SODIUM PHOSPHATE 10 MG/ML IJ SOLN
INTRAMUSCULAR | Status: AC
  Filled 2017-10-22: qty 1

## 2017-10-22 MED ORDER — LIDOCAINE-EPINEPHRINE 1 %-1:100000 IJ SOLN
INTRAMUSCULAR | Status: DC | PRN
  Administered 2017-10-22: 10 mL

## 2017-10-22 MED ORDER — OXYCODONE HCL 5 MG PO TABS: 5.0000 mg | ORAL_TABLET | ORAL | Status: DC | PRN

## 2017-10-22 MED ORDER — HYDROCODONE-ACETAMINOPHEN 5-325 MG PO TABS
1.0000 | ORAL_TABLET | Freq: Once | ORAL | Status: AC
  Administered 2017-10-22: 1 via ORAL

## 2017-10-22 MED ORDER — PROMETHAZINE HCL 25 MG/ML IJ SOLN: 6.2500 mg | INTRAMUSCULAR | Status: DC | PRN

## 2017-10-22 MED ORDER — PROPOFOL 10 MG/ML IV BOLUS
INTRAVENOUS | Status: DC | PRN
  Administered 2017-10-22: 200 mg via INTRAVENOUS

## 2017-10-22 MED ORDER — CEFAZOLIN SODIUM-DEXTROSE 2-4 GM/100ML-% IV SOLN
INTRAVENOUS | Status: AC
Start: 2017-10-22 — End: 2017-10-22
  Filled 2017-10-22: qty 200

## 2017-10-22 MED ORDER — PHENYLEPHRINE HCL 10 MG/ML IJ SOLN
INTRAMUSCULAR | Status: DC | PRN
  Administered 2017-10-22 (×2): 80 ug via INTRAVENOUS

## 2017-10-22 MED ORDER — DEXTROSE 5 % IV SOLN
3.0000 g | INTRAVENOUS | Status: AC
  Administered 2017-10-22: 3 g via INTRAVENOUS

## 2017-10-22 MED ORDER — BUPIVACAINE-EPINEPHRINE (PF) 0.25% -1:200000 IJ SOLN
INTRAMUSCULAR | Status: AC
  Filled 2017-10-22: qty 30

## 2017-10-22 MED ORDER — SODIUM CHLORIDE 0.9 % IV SOLN
INTRAVENOUS | Status: DC | PRN
  Administered 2017-10-22: 500 mL

## 2017-10-22 MED ORDER — MIDAZOLAM HCL 2 MG/2ML IJ SOLN: 1.0000 mg | INTRAMUSCULAR | Status: DC | PRN

## 2017-10-22 MED ORDER — FENTANYL CITRATE (PF) 100 MCG/2ML IJ SOLN
50.0000 ug | INTRAMUSCULAR | Status: DC | PRN
  Administered 2017-10-22 (×2): 50 ug via INTRAVENOUS

## 2017-10-22 MED ORDER — LIDOCAINE-EPINEPHRINE 1 %-1:100000 IJ SOLN
INTRAMUSCULAR | Status: AC
  Filled 2017-10-22: qty 1

## 2017-10-22 MED ORDER — SODIUM CHLORIDE 0.9% FLUSH: 3.0000 mL | INTRAVENOUS | Status: DC | PRN

## 2017-10-22 MED ORDER — SCOPOLAMINE 1 MG/3DAYS TD PT72: 1.0000 | MEDICATED_PATCH | Freq: Once | TRANSDERMAL | Status: DC | PRN

## 2017-10-22 MED ORDER — ACETAMINOPHEN 325 MG PO TABS: 650.0000 mg | ORAL_TABLET | ORAL | Status: DC | PRN

## 2017-10-22 MED ORDER — SODIUM CHLORIDE 0.9% FLUSH: 3.0000 mL | Freq: Two times a day (BID) | INTRAVENOUS | Status: DC

## 2017-10-22 SURGICAL SUPPLY — 40 items
BAG DECANTER FOR FLEXI CONT (MISCELLANEOUS) ×2 IMPLANT
BLADE HEX COATED 2.75 (ELECTRODE) ×2 IMPLANT
BLADE SURG 10 STRL SS (BLADE) ×2 IMPLANT
BLADE SURG 15 STRL LF DISP TIS (BLADE) ×1 IMPLANT
BLADE SURG 15 STRL SS (BLADE) ×1
CANISTER SUCT 1200ML W/VALVE (MISCELLANEOUS) ×2 IMPLANT
COLLAGEN CELLERATERX 1 GRAM (Miscellaneous) ×2 IMPLANT
COVER BACK TABLE 60X90IN (DRAPES) ×2 IMPLANT
DECANTER SPIKE VIAL GLASS SM (MISCELLANEOUS) ×2 IMPLANT
DRAPE U-SHAPE 76X120 STRL (DRAPES) ×2 IMPLANT
DRSG PAD ABDOMINAL 8X10 ST (GAUZE/BANDAGES/DRESSINGS) ×2 IMPLANT
ELECT NEEDLE TIP 2.8 STRL (NEEDLE) ×2 IMPLANT
ELECT REM PT RETURN 9FT ADLT (ELECTROSURGICAL) ×2
ELECTRODE REM PT RTRN 9FT ADLT (ELECTROSURGICAL) ×1 IMPLANT
GAUZE SPONGE 4X4 12PLY STRL (GAUZE/BANDAGES/DRESSINGS) ×2 IMPLANT
GAUZE XEROFORM 5X9 LF (GAUZE/BANDAGES/DRESSINGS) ×2 IMPLANT
GEL CELLERATE 28G (Miscellaneous) ×2 IMPLANT
GLOVE BIO SURGEON STRL SZ 6.5 (GLOVE) ×6 IMPLANT
GOWN STRL REUS W/ TWL LRG LVL3 (GOWN DISPOSABLE) ×2 IMPLANT
GOWN STRL REUS W/TWL LRG LVL3 (GOWN DISPOSABLE) ×2
NEEDLE HYPO 25X1 1.5 SAFETY (NEEDLE) ×2 IMPLANT
PACK BASIN DAY SURGERY FS (CUSTOM PROCEDURE TRAY) ×2 IMPLANT
PENCIL BUTTON HOLSTER BLD 10FT (ELECTRODE) ×2 IMPLANT
SHEET MEDIUM DRAPE 40X70 STRL (DRAPES) ×2 IMPLANT
SLEEVE SCD COMPRESS KNEE MED (MISCELLANEOUS) ×2 IMPLANT
SPLINT FIBERGLASS 3X35 (CAST SUPPLIES) ×2 IMPLANT
SPONGE LAP 18X18 RF (DISPOSABLE) ×2 IMPLANT
STAPLER VISISTAT 35W (STAPLE) IMPLANT
STOCKINETTE IMPERVIOUS LG (DRAPES) IMPLANT
SUT MNCRL AB 3-0 PS2 18 (SUTURE) ×2 IMPLANT
SUT MNCRL AB 4-0 PS2 18 (SUTURE) ×4 IMPLANT
SUT MON AB 5-0 PS2 18 (SUTURE) ×6 IMPLANT
SUT SILK 3 0 PS 1 (SUTURE) ×2 IMPLANT
SYR BULB IRRIGATION 50ML (SYRINGE) ×2 IMPLANT
SYR CONTROL 10ML LL (SYRINGE) ×2 IMPLANT
TOWEL OR 17X24 6PK STRL BLUE (TOWEL DISPOSABLE) ×4 IMPLANT
TRAY DSU PREP LF (CUSTOM PROCEDURE TRAY) ×2 IMPLANT
TUBE CONNECTING 20X1/4 (TUBING) ×2 IMPLANT
UNDERPAD 30X30 (UNDERPADS AND DIAPERS) ×2 IMPLANT
YANKAUER SUCT BULB TIP NO VENT (SUCTIONS) ×2 IMPLANT

## 2017-10-22 NOTE — Transfer of Care (Signed)
Immediate Anesthesia Transfer of Care Note  Patient: Cameron Sharp  Procedure(s) Performed: RE-EXCISION OF LEFT GROIN EXTRA MAMMARY PAGET'S DISEASE (Left Groin)  Patient Location: PACU  Anesthesia Type:General  Level of Consciousness: sedated  Airway & Oxygen Therapy: Patient Spontanous Breathing and Patient connected to face mask oxygen  Post-op Assessment: Report given to RN and Post -op Vital signs reviewed and stable  Post vital signs: Reviewed and stable  Last Vitals:  Vitals Value Taken Time  BP 88/51 10/22/2017  8:31 AM  Temp    Pulse 60 10/22/2017  8:36 AM  Resp 12 10/22/2017  8:36 AM  SpO2 96 % 10/22/2017  8:36 AM  Vitals shown include unvalidated device data.  Last Pain:  Vitals:   10/22/17 0648  TempSrc: Oral         Complications: No apparent anesthesia complications

## 2017-10-22 NOTE — Anesthesia Procedure Notes (Signed)
Procedure Name: LMA Insertion Date/Time: 10/22/2017 7:38 AM Performed by: Maryella Shivers, CRNA Pre-anesthesia Checklist: Patient identified, Emergency Drugs available, Suction available and Patient being monitored Patient Re-evaluated:Patient Re-evaluated prior to induction Oxygen Delivery Method: Circle system utilized Preoxygenation: Pre-oxygenation with 100% oxygen Induction Type: IV induction Ventilation: Mask ventilation without difficulty LMA: LMA inserted LMA Size: 5.0 Number of attempts: 1 Airway Equipment and Method: Bite block Placement Confirmation: positive ETCO2 Tube secured with: Tape Dental Injury: Teeth and Oropharynx as per pre-operative assessment

## 2017-10-22 NOTE — Anesthesia Postprocedure Evaluation (Signed)
Anesthesia Post Note  Patient: Cameron Sharp  Procedure(s) Performed: RE-EXCISION OF LEFT GROIN EXTRA MAMMARY PAGET'S DISEASE (Left Groin)     Patient location during evaluation: PACU Anesthesia Type: General Level of consciousness: sedated Pain management: pain level controlled Vital Signs Assessment: post-procedure vital signs reviewed and stable Respiratory status: spontaneous breathing and respiratory function stable Cardiovascular status: stable Postop Assessment: no apparent nausea or vomiting Anesthetic complications: no    Last Vitals:  Vitals:   10/22/17 0915 10/22/17 0935  BP: (!) 107/53 130/62  Pulse: (!) 59 67  Resp: 14 20  Temp:  37.2 C  SpO2: 97%     Last Pain:  Vitals:   10/22/17 0935  TempSrc: Oral  PainSc: [place]

## 2017-10-22 NOTE — Anesthesia Preprocedure Evaluation (Signed)
Anesthesia Evaluation  Patient identified by MRN, date of birth, ID band Patient awake    Reviewed: Allergy & Precautions, NPO status , Patient's Chart, lab work & pertinent test results, reviewed documented beta blocker date and time   History of Anesthesia Complications Negative for: history of anesthetic complications  Airway Mallampati: II  TM Distance: >3 FB Neck ROM: Full    Dental  (+) Teeth Intact, Dental Advisory Given, Caps   Pulmonary sleep apnea and Continuous Positive Airway Pressure Ventilation , former smoker,    Pulmonary exam normal breath sounds clear to auscultation       Cardiovascular hypertension, Pt. on home beta blockers and Pt. on medications + Peripheral Vascular Disease  + dysrhythmias Atrial Fibrillation + pacemaker  Rhythm:Irregular Rate:Normal     Neuro/Psych negative neurological ROS  negative psych ROS   GI/Hepatic negative GI ROS, Neg liver ROS,   Endo/Other  Obesity   Renal/GU negative Renal ROS     Musculoskeletal  (+) Arthritis , Osteoarthritis,    Abdominal   Peds  Hematology  (+) Blood dyscrasia (Coumadin), ,   Anesthesia Other Findings Day of surgery medications reviewed with the patient.  Reproductive/Obstetrics                             Anesthesia Physical  Anesthesia Plan  ASA: III  Anesthesia Plan: General   Post-op Pain Management:    Induction: Intravenous  PONV Risk Score and Plan: 2 and Ondansetron and Dexamethasone  Airway Management Planned: LMA  Additional Equipment:   Intra-op Plan:   Post-operative Plan: Extubation in OR  Informed Consent: I have reviewed the patients History and Physical, chart, labs and discussed the procedure including the risks, benefits and alternatives for the proposed anesthesia with the patient or authorized representative who has indicated his/her understanding and acceptance.   Dental  advisory given  Plan Discussed with: CRNA and Anesthesiologist  Anesthesia Plan Comments:         Anesthesia Quick Evaluation

## 2017-10-22 NOTE — Op Note (Signed)
First Assist Op Note: [facility] I assisted the Surgeon(s) ______Dr. Lyndee Leo Dillingham__ on the procedure(s): Re-excision of left groin extra mammary paget's disease______on Date __4/2/19_______  I provided my assistance on this case as follows:  I was present and acted as first Environmental consultant during this operation. I was present during the patient transport into the operative suite and assisted the OR staff with transferring and positioning of the patient. All extremities were checked and properly cushioned and safety straps in place. I was involved in the prepping and placement of sterile drapes. A time out was performed and all information confirmed to be correct.  I first assisted during the case including retraction for exposure, assisting with closure of surgical wounds and application of sterile dressings. I provided assistance with application of post operative garments/splinting and assisted with patient transfer back to the stretcher as needed.   Sonji Starkes,PA-C Plastic Surgery [phone]

## 2017-10-22 NOTE — Op Note (Signed)
DATE OF OPERATION: 10/22/2017  LOCATION: [facility]  PREOPERATIVE DIAGNOSIS: left groin extramammary paget's disease positive margin  POSTOPERATIVE DIAGNOSIS: Same  PROCEDURE: Excision of 4 x 7 cm extramammary paget's disease left groin with placement of cellerate (gel and 1 gm powder)  SURGEON: Breena Bevacqua Sanger Mande Auvil, DO  ASSISTANT: Shawn Rayburn, PA  EBL: 5 cc  CONDITION: Stable  COMPLICATIONS: None  INDICATION: The patient, Cameron Sharp, is a 82 y.o. male born on 1936/07/03, is here for treatment of positive margins of his left groin extramammary paget's disease.   PROCEDURE DETAILS:  The patient was seen prior to surgery and marked.  The IV antibiotics were given. The patient was taken to the operating room and given a general anesthetic. A standard time out was performed and all information was confirmed by those in the room. SCDs were placed.   The left groin was prepped and draped.  The area was marked with the positive margins noted from the previous excision.  The area was injected with local for intraoperative hemostasis and post operative pain control.  The #15 blade and the needle tip bovie were used to excise the 4 x 7 cm elliptical mark.  A short stitch was placed at the 12 o'clock position and a long stitch at the 3 o'clock position.  There was scaring from the previous incision and this was excised and sent as the deep central margin.  Undermining was done for 1 cm for a more tension free closure.  Hemostasis was achieved with electrocautery and there was no bleeding and a nice dry wound bed. The deep layer was closed with the 3-0 Monocryl.  The 4-0 and 5-0 Monocryl were used for the subcuticular and skin closure with vertical mattress sutures and single sutures.  The Cellerate was placed over the area.  Gauze and ABDs were applied.  The patient was allowed to wake up and taken to recovery room in stable condition at the end of the case. The family was  notified at the end of the case.

## 2017-10-22 NOTE — Discharge Instructions (Signed)
May shower tomorrow. No heavy lifting.    Call your surgeon if you experience:   1.  Fever over 101.0. 2.  Inability to urinate. 3.  Nausea and/or vomiting. 4.  Extreme swelling or bruising at the surgical site. 5.  Continued bleeding from the incision. 6.  Increased pain, redness or drainage from the incision. 7.  Problems related to your pain medication. 8.  Any problems and/or concerns    Post Anesthesia Home Care Instructions  Activity: Get plenty of rest for the remainder of the day. A responsible individual must stay with you for 24 hours following the procedure.  For the next 24 hours, DO NOT: -Drive a car -Paediatric nurse -Drink alcoholic beverages -Take any medication unless instructed by your physician -Make any legal decisions or sign important papers.  Meals: Start with liquid foods such as gelatin or soup. Progress to regular foods as tolerated. Avoid greasy, spicy, heavy foods. If nausea and/or vomiting occur, drink only clear liquids until the nausea and/or vomiting subsides. Call your physician if vomiting continues.  Special Instructions/Symptoms: Your throat may feel dry or sore from the anesthesia or the breathing tube placed in your throat during surgery. If this causes discomfort, gargle with warm salt water. The discomfort should disappear within 24 hours.  If you had a scopolamine patch placed behind your ear for the management of post- operative nausea and/or vomiting:  1. The medication in the patch is effective for 72 hours, after which it should be removed.  Wrap patch in a tissue and discard in the trash. Wash hands thoroughly with soap and water. 2. You may remove the patch earlier than 72 hours if you experience unpleasant side effects which may include dry mouth, dizziness or visual disturbances. 3. Avoid touching the patch. Wash your hands with soap and water after contact with the patch.

## 2017-10-22 NOTE — Interval H&P Note (Signed)
History and Physical Interval Note:  10/22/2017 7:39 AM  Cameron Sharp  has presented today for surgery, with the diagnosis of Paget Disease, Extra Mammary  The various methods of treatment have been discussed with the patient and family. After consideration of risks, benefits and other options for treatment, the patient has consented to  Procedure(s): RE-EXCISION OF LEFT GROIN EXTRA MAMMARY PAGET'S DISEASE (Left) as a surgical intervention .  The patient's history has been reviewed, patient examined, no change in status, stable for surgery.  I have reviewed the patient's chart and labs.  Questions were answered to the patient's satisfaction.     [name]

## 2017-10-23 ENCOUNTER — Encounter (HOSPITAL_BASED_OUTPATIENT_CLINIC_OR_DEPARTMENT_OTHER): Payer: Self-pay | Admitting: Plastic Surgery

## 2017-10-28 DIAGNOSIS — R05 Cough: Secondary | ICD-10-CM | POA: Diagnosis not present

## 2017-10-28 DIAGNOSIS — Z0000 Encounter for general adult medical examination without abnormal findings: Secondary | ICD-10-CM | POA: Diagnosis not present

## 2017-10-28 DIAGNOSIS — I4891 Unspecified atrial fibrillation: Secondary | ICD-10-CM | POA: Diagnosis not present

## 2017-10-28 DIAGNOSIS — E785 Hyperlipidemia, unspecified: Secondary | ICD-10-CM | POA: Diagnosis not present

## 2017-10-31 ENCOUNTER — Ambulatory Visit (INDEPENDENT_AMBULATORY_CARE_PROVIDER_SITE_OTHER): Payer: Medicare Other | Admitting: Pharmacist

## 2017-10-31 DIAGNOSIS — I482 Chronic atrial fibrillation: Secondary | ICD-10-CM

## 2017-10-31 DIAGNOSIS — Z7901 Long term (current) use of anticoagulants: Secondary | ICD-10-CM

## 2017-10-31 DIAGNOSIS — I4821 Permanent atrial fibrillation: Secondary | ICD-10-CM

## 2017-11-04 ENCOUNTER — Telehealth: Payer: Self-pay | Admitting: Pulmonary Disease

## 2017-11-04 NOTE — Telephone Encounter (Signed)
Okay to get him on Wednesday if opening

## 2017-11-04 NOTE — Telephone Encounter (Signed)
Called and spoke to patient. Patient stated he has been off his Coumadin for over a month due to surgery. Patient stated he has some skin cancer removed near his groin and the incision site continues to bleed. Patient also stated that he is coughing up blood as well. Patient stated he knows he can't get back on the Coumadin until the bleeding stops. Patient has an appointment for a consult on 4/18 and was calling in hopes to get an earlier appointment. Let patient know that there is not an earlier appointment available at this time. Nothing further is needed at this time. Will route to [place] and Dr. Elsworth Soho as Juluis Rainier.

## 2017-11-06 ENCOUNTER — Institutional Professional Consult (permissible substitution): Payer: Medicare Other | Admitting: Pulmonary Disease

## 2017-11-07 ENCOUNTER — Telehealth: Payer: Self-pay | Admitting: Pulmonary Disease

## 2017-11-07 ENCOUNTER — Encounter: Payer: Self-pay | Admitting: Pulmonary Disease

## 2017-11-07 ENCOUNTER — Encounter (HOSPITAL_COMMUNITY): Payer: Self-pay

## 2017-11-07 ENCOUNTER — Other Ambulatory Visit: Payer: Self-pay

## 2017-11-07 ENCOUNTER — Inpatient Hospital Stay: Admission: AD | Admit: 2017-11-07 | Payer: Medicare Other | Source: Ambulatory Visit | Admitting: Internal Medicine

## 2017-11-07 ENCOUNTER — Ambulatory Visit (INDEPENDENT_AMBULATORY_CARE_PROVIDER_SITE_OTHER): Payer: Medicare Other | Admitting: Pulmonary Disease

## 2017-11-07 ENCOUNTER — Inpatient Hospital Stay (HOSPITAL_COMMUNITY)
Admission: RE | Admit: 2017-11-07 | Discharge: 2017-11-09 | DRG: 176 | Disposition: A | Discharge status: Home / Self Care | Payer: Medicare Other | Source: Ambulatory Visit | Attending: Family Medicine | Admitting: Family Medicine

## 2017-11-07 VITALS — BP 118/68 | HR 66 | Ht 75.0 in | Wt 272.0 lb

## 2017-11-07 DIAGNOSIS — M479 Spondylosis, unspecified: Secondary | ICD-10-CM | POA: Diagnosis present

## 2017-11-07 DIAGNOSIS — Z800 Family history of malignant neoplasm of digestive organs: Secondary | ICD-10-CM

## 2017-11-07 DIAGNOSIS — G4733 Obstructive sleep apnea (adult) (pediatric): Secondary | ICD-10-CM | POA: Diagnosis present

## 2017-11-07 DIAGNOSIS — Z9049 Acquired absence of other specified parts of digestive tract: Secondary | ICD-10-CM | POA: Diagnosis not present

## 2017-11-07 DIAGNOSIS — I10 Essential (primary) hypertension: Secondary | ICD-10-CM

## 2017-11-07 DIAGNOSIS — I482 Chronic atrial fibrillation, unspecified: Secondary | ICD-10-CM

## 2017-11-07 DIAGNOSIS — R042 Hemoptysis: Secondary | ICD-10-CM | POA: Diagnosis not present

## 2017-11-07 DIAGNOSIS — Z87891 Personal history of nicotine dependence: Secondary | ICD-10-CM | POA: Diagnosis not present

## 2017-11-07 DIAGNOSIS — M109 Gout, unspecified: Secondary | ICD-10-CM | POA: Diagnosis present

## 2017-11-07 DIAGNOSIS — Z803 Family history of malignant neoplasm of breast: Secondary | ICD-10-CM | POA: Diagnosis not present

## 2017-11-07 DIAGNOSIS — Z8042 Family history of malignant neoplasm of prostate: Secondary | ICD-10-CM

## 2017-11-07 DIAGNOSIS — I2699 Other pulmonary embolism without acute cor pulmonale: Principal | ICD-10-CM | POA: Diagnosis present

## 2017-11-07 DIAGNOSIS — Z801 Family history of malignant neoplasm of trachea, bronchus and lung: Secondary | ICD-10-CM | POA: Diagnosis not present

## 2017-11-07 DIAGNOSIS — E785 Hyperlipidemia, unspecified: Secondary | ICD-10-CM | POA: Diagnosis present

## 2017-11-07 DIAGNOSIS — M889 Osteitis deformans of unspecified bone: Secondary | ICD-10-CM

## 2017-11-07 DIAGNOSIS — R0602 Shortness of breath: Secondary | ICD-10-CM | POA: Diagnosis not present

## 2017-11-07 DIAGNOSIS — Z7901 Long term (current) use of anticoagulants: Secondary | ICD-10-CM | POA: Diagnosis not present

## 2017-11-07 DIAGNOSIS — Z950 Presence of cardiac pacemaker: Secondary | ICD-10-CM

## 2017-11-07 LAB — CBC
HEMATOCRIT: 40.1 % (ref 39.0–52.0)
HEMOGLOBIN: 13.4 g/dL (ref 13.0–17.0)
MCH: 32.3 pg (ref 26.0–34.0)
MCHC: 33.4 g/dL (ref 30.0–36.0)
MCV: 96.6 fL (ref 78.0–100.0)
Platelets: 256 10*3/uL (ref 150–400)
RBC: 4.15 MIL/uL — AB (ref 4.22–5.81)
RDW: 13.2 % (ref 11.5–15.5)
WBC: 7.9 10*3/uL (ref 4.0–10.5)

## 2017-11-07 LAB — APTT: APTT: 34 s (ref 24–36)

## 2017-11-07 LAB — PROTIME-INR
INR: 1.15
Prothrombin Time: 14.6 seconds (ref 11.4–15.2)

## 2017-11-07 LAB — POCT I-STAT CREATININE: Creatinine, Ser: 1 mg/dL (ref 0.61–1.24)

## 2017-11-07 MED ORDER — IOPAMIDOL (ISOVUE-370) INJECTION 76%
INTRAVENOUS | Status: AC
  Filled 2017-11-07: qty 100

## 2017-11-07 MED ORDER — ATORVASTATIN CALCIUM 20 MG PO TABS
20.0000 mg | ORAL_TABLET | Freq: Every day | ORAL | Status: DC
  Administered 2017-11-07 – 2017-11-08 (×2): 20 mg via ORAL
  Filled 2017-11-07 (×3): qty 1

## 2017-11-07 MED ORDER — BACLOFEN 10 MG PO TABS
10.0000 mg | ORAL_TABLET | Freq: Two times a day (BID) | ORAL | Status: DC
  Administered 2017-11-07 – 2017-11-09 (×4): 10 mg via ORAL
  Filled 2017-11-07 (×4): qty 1

## 2017-11-07 MED ORDER — VITAMIN B-1 100 MG PO TABS
250.0000 mg | ORAL_TABLET | Freq: Every day | ORAL | Status: DC
  Administered 2017-11-08 – 2017-11-09 (×2): 250 mg via ORAL
  Filled 2017-11-07 (×2): qty 3

## 2017-11-07 MED ORDER — FEBUXOSTAT 40 MG PO TABS
40.0000 mg | ORAL_TABLET | Freq: Every day | ORAL | Status: DC
  Administered 2017-11-08 – 2017-11-09 (×2): 40 mg via ORAL
  Filled 2017-11-07 (×2): qty 1

## 2017-11-07 MED ORDER — IOPAMIDOL (ISOVUE-370) INJECTION 76%
100.0000 mL | Freq: Once | INTRAVENOUS | Status: AC | PRN
  Administered 2017-11-07: 100 mL via INTRAVENOUS

## 2017-11-07 MED ORDER — HEPARIN (PORCINE) IN NACL 100-0.45 UNIT/ML-% IJ SOLN
1800.0000 [IU]/h | INTRAMUSCULAR | Status: DC
  Administered 2017-11-07 – 2017-11-09 (×4): 1800 [IU]/h via INTRAVENOUS
  Filled 2017-11-07 (×4): qty 250

## 2017-11-07 MED ORDER — HYDROCHLOROTHIAZIDE 12.5 MG PO CAPS
12.5000 mg | ORAL_CAPSULE | Freq: Every day | ORAL | Status: DC
  Administered 2017-11-08 – 2017-11-09 (×2): 12.5 mg via ORAL
  Filled 2017-11-07 (×2): qty 1

## 2017-11-07 MED ORDER — VALSARTAN-HYDROCHLOROTHIAZIDE 160-12.5 MG PO TABS: 1.0000 | ORAL_TABLET | Freq: Every day | ORAL | Status: DC

## 2017-11-07 MED ORDER — HEPARIN BOLUS VIA INFUSION
4000.0000 [IU] | Freq: Once | INTRAVENOUS | Status: AC
  Administered 2017-11-07: 4000 [IU] via INTRAVENOUS
  Filled 2017-11-07: qty 4000

## 2017-11-07 MED ORDER — IRBESARTAN 150 MG PO TABS
150.0000 mg | ORAL_TABLET | Freq: Every day | ORAL | Status: DC
  Administered 2017-11-08 – 2017-11-09 (×2): 150 mg via ORAL
  Filled 2017-11-07 (×2): qty 1

## 2017-11-07 MED ORDER — METOPROLOL TARTRATE 25 MG PO TABS
12.5000 mg | ORAL_TABLET | Freq: Every morning | ORAL | Status: DC
  Administered 2017-11-08 – 2017-11-09 (×2): 12.5 mg via ORAL
  Filled 2017-11-07 (×2): qty 1

## 2017-11-07 NOTE — H&P (Signed)
History and Physical  Cameron Sharp:[id number] DOB: Apr 22, 1936 DOA: 11/07/2017  Referring physician: Dr Elsworth Soho, pulmonology PCP: [name], MD  Outpatient Specialists:  Patient coming from: Home as a direct admit  Chief Complaint: Hemoptysis  HPI: Cameron Sharp is a 82 y.o. male with medical history significant for Paget disease, chronic atrial fibrillation, recent soft tissue biopsy to his left groin and previously to the right groin. He was taken off Coumadin for about a month for the procedures.  He presented to his pulmonologist's office today with complaints of hemoptysis of 2-day duration off his Coumadin.    His pulmonologist Dr. Elsworth Soho sent him to [facility] radiology for CT chest angiogram which revealed lower lobe pulmonary artery emboli bilaterally, with no evidence of right heart strain.  Patient denies any chest pain, dyspnea, or palpitations.  Denies fevers, chills, or night sweats.  Dr. Elsworth Soho requested direct admission to start treatment for pulmonary embolism.     ED Course: Direct admit from Dr. Bari Mantis office  Review of Systems: Review of system as stated in the HPI.  All other systems reviewed and are negative.   Past Medical History:  Diagnosis Date  . Arthritis    back and neck  . Atrial fibrillation ([place]) 10/10/2012  . Chronic back pain   . Gout   . HTN (hypertension) 05/01/2013  . Hyperlipidemia   . Hypertension   . OSA on CPAP    wears CPAP nightly  . Pacemaker   . Pacemaker -[place] 2010 05/01/2013  . Paget disease, extramammary    left groin  . Permanent atrial fibrillation [facility])    Past Surgical History:  Procedure Laterality Date  . carpel tunnel surgery Right 2012  . CHOLECYSTECTOMY    . COLONOSCOPY    . CYST EXCISION Right 05/08/2017   Procedure: REEXCISION OF EXTRA MAMARY PAGETS DISEASE OF RIGHT GROIN;  Surgeon: [name], DO;  Location: [place];  Service: Plastics;  Laterality: Right;  .  EXCISION MASS LOWER EXTREMETIES Left 09/26/2017   Procedure: Excision of left groin extra mammary Paget's disease;  Surgeon: [name], DO;  Location: [place];  Service: Plastics;  Laterality: Left;  . EXCISION MASS LOWER EXTREMETIES Left 10/22/2017   Procedure: RE-EXCISION OF LEFT GROIN EXTRA MAMMARY PAGET'S DISEASE;  Surgeon: [name], DO;  Location: [place];  Service: Plastics;  Laterality: Left;  . GALLBLADDER SURGERY  12/2006  . LESION EXCISION WITH COMPLEX REPAIR Bilateral 07/31/2017   Procedure: BIOPSY OF BILATERAL GROIN  CHANGING SKIN LESIONS;  Surgeon: [name], DO;  Location: [place];  Service: Plastics;  Laterality: Bilateral;  . LIPOMA EXCISION N/A 11/01/2016   Procedure: EXCISION OF SEBACEOUS CYST ON BACK;  Surgeon: [name], DO;  Location: [place];  Service: Plastics;  Laterality: N/A;  . MASS EXCISION N/A 11/01/2016   Procedure: EXCISION OF RIGHT GROIN SKIN LESION;  Surgeon: [name], DO;  Location: [place];  Service: Plastics;  Laterality: N/A;  . MASS EXCISION Right 11/15/2016   Procedure: EXCISION POSITIVE MARGIN RIGHT GROIN;  Surgeon: [name], DO;  Location: [place];  Service: Plastics;  Laterality: Right;  . MASS EXCISION Right 04/04/2017   Procedure: REEXCISION OF EXTRA MAMARY PAGETS SKIN DISEASE OF RIGHT GROIN;  Surgeon: [name], DO;  Location: [place];  Service: Plastics;  Laterality: Right;  . NM MYOCAR PERF WALL  MOTION  05/22/07   no significant ischemia  . PERMANENT PACEMAKER INSERTION  01/09/2009   [facility]  . TONSILLECTOMY     age 89  . US ECHOCARDIOGRAPHY  11/24/07   mild LVH,LA & RA mod to severely dilated,mild mitral annular ca+,mild TR,mild Pulmonary hypertensio,AOV mod. sclerotic,mild AI w/root dilatation and ca+.    Social History:  reports that he quit smoking about  41 years ago. He has a 39.00 pack-year smoking history. He has never used smokeless tobacco. He reports that he drinks alcohol. He reports that he does not use drugs.   No Known Allergies  Family History  Problem Relation Age of Onset  . Stomach cancer Father   . Lung cancer Maternal Grandfather   . Breast cancer Paternal Grandmother   . Prostate cancer Paternal Grandfather      Prior to Admission medications   Medication Sig Start Date End Date Taking? Authorizing Provider  atorvastatin (LIPITOR) 20 MG tablet TAKE 1 TABLET DAILY 07/15/17  Yes Croitoru, Mihai, MD  baclofen (LIORESAL) 10 MG tablet Take 1 tablet by mouth 2 (two) times daily. 11/04/14  Yes [provider]  docusate sodium (COLACE) 100 MG capsule Take 200-300 mg by mouth 2 (two) times daily.    Yes [provider]  gabapentin (NEURONTIN) 300 MG capsule Take by mouth 3 (three) times daily. PATIENT TAKES 600MG  IN AM, 300MG  AT MID DAY, 600 AT BEDTIME   Yes [provider]  HYDROcodone-acetaminophen (NORCO) 5-325 MG tablet Take 1 tablet by mouth every 6 (six) hours as needed for moderate pain. 09/26/17  Yes Dillingham, Loel Lofty, DO  metoprolol tartrate (LOPRESSOR) 25 MG tablet Take 0.5 tablets (12.5 mg total) by mouth every morning. 03/27/17  Yes Croitoru, Mihai, MD  Thiamine HCl (VITAMIN B-1) 250 MG tablet Take 250 mg by mouth daily.   Yes [provider]  ULORIC 40 MG tablet Take 1 tablet (40 mg total) by mouth daily. 07/19/16  Yes Newt Minion, MD  valsartan-hydrochlorothiazide (DIOVAN-HCT) 160-12.5 MG tablet TAKE 1 TABLET DAILY 05/17/17  Yes Troy Sine, MD  azithromycin (ZITHROMAX) 250 MG tablet TK 2 TS PO FOR 1 DAY THEN TK 1 T PO D FOR 4 DAYS 10/28/17   [provider]  COUMADIN 5 MG tablet TAKE 1 TABLET DAILY OR AS DIRECTED Patient not taking: Reported on 11/07/2017 05/01/16   Croitoru, Dani Gobble, MD    Physical Exam: BP 118/73 (BP Location: Right Arm)   Pulse 65   Temp (!) 97.5  F (36.4 C) (Oral)   Resp 19   Ht 6\' 3"  (1.905 m)   Wt 123.8 kg (272 lb 14.4 oz)   SpO2 100%   BMI 34.11 kg/m   General: 82 year old Caucasian male well-developed well-nourished in no acute distress.  Alert and oriented x3. Eyes: Anicteric sclera. ENT: Mucous membranes moist with no erythema or exudates. Neck: No JVD or thyromegaly. Cardiovascular: Regular rate and rhythm with no rubs or gallops. Respiratory: Auscultation with no wheezes or rales. Abdomen: Soft nontender nondistended with normal bowel sounds x4 quadrant. Skin: Left groin with healing wound from prior surgery. Musculoskeletal: Moves all 4 extremities.  There is edema in lower extremity covered with compression stockings. Psychiatric: Mood is appropriate for condition and setting. Neurologic: Alert and oriented x3.  No focal motor deficits.          Labs on Admission:  Basic Metabolic Panel: Recent Labs  Lab 11/07/17 1152  CREATININE 1.00   Liver Function Tests: No results  for input(s): AST, ALT, ALKPHOS, BILITOT, PROT, ALBUMIN in the last 168 hours. No results for input(s): LIPASE, AMYLASE in the last 168 hours. No results for input(s): AMMONIA in the last 168 hours. CBC: No results for input(s): WBC, NEUTROABS, HGB, HCT, MCV, PLT in the last 168 hours. Cardiac Enzymes: No results for input(s): CKTOTAL, CKMB, CKMBINDEX, TROPONINI in the last 168 hours.  BNP (last 3 results) No results for input(s): BNP in the last 8760 hours.  ProBNP (last 3 results) No results for input(s): PROBNP in the last 8760 hours.  CBG: No results for input(s): GLUCAP in the last 168 hours.  Radiological Exams on Admission: Ct Angio Chest W/cm &/or Wo Cm  Addendum Date: 11/07/2017   ADDENDUM REPORT: 11/07/2017 13:40 ADDENDUM: There is a stable 5 mm nodular opacity in the lateral segment of the left lower lobe seen on axial slice 96 series 6. Electronically Signed   By: Lowella Grip III M.D.   On: 11/07/2017 13:40    Result Date: 11/07/2017 CLINICAL DATA:  Hemoptysis and shortness of breath EXAM: CT ANGIOGRAPHY CHEST WITH CONTRAST TECHNIQUE: Multidetector CT imaging of the chest was performed using the standard protocol during bolus administration of intravenous contrast. Multiplanar CT image reconstructions and MIPs were obtained to evaluate the vascular anatomy. CONTRAST:  121mL ISOVUE-370 IOPAMIDOL (ISOVUE-370) INJECTION 76% COMPARISON:  Chest CT September 24, 2016; chest radiograph November 07, 2017 FINDINGS: Cardiovascular: There are pulmonary emboli in multiple left and right lower lobe pulmonary arterial branches. Pulmonary emboli are somewhat more central in location on the left than on the right, although there is no pulmonary embolus in either main pulmonary artery. There is no evidence of right heart strain; right ventricle to left ventricle diameter ratio is less than 0.9. Pacemaker lead is attached to the right ventricle. There is prominence of the ascending thoracic aorta measuring 4.9 x 4.9 cm in transverse diameter. No dissection is evident. There is atherosclerotic calcification in the aorta as well as in the proximal great vessels. There are foci of coronary artery calcification. There is no pericardial effusion or pericardial thickening. The main pulmonary outflow tract is dilated measuring 3.7 cm, a finding indicative of pulmonary arterial hypertension. Mediastinum/Nodes: Thyroid appears normal. No appreciable thoracic adenopathy. No esophageal lesions are evident. Lungs/Pleura: There are small pleural effusions bilaterally, slightly larger on the right than on the left. There is patchy bibasilar atelectasis. There is no appreciable edema or consolidation. Upper Abdomen: There is atherosclerotic calcification in the upper abdominal aorta. There is reflux of contrast into the inferior vena cava and hepatic veins. Visualized upper abdominal structures otherwise appear unremarkable. Musculoskeletal: There is  degenerative change in the thoracic spine with diffuse idiopathic skeletal hyperostosis. No blastic or lytic bone lesions. Review of the MIP images confirms the above findings. IMPRESSION: 1. Lower lobe pulmonary artery emboli bilaterally, slightly more on the left than on the right. No more central pulmonary embolus. No right heart strain evident. 2. Dilatation of the ascending thoracic aorta measuring 4.9 x 4.9 cm. Ascending thoracic aortic aneurysm. Recommend semi-annual imaging followup by CTA or MRA and referral to cardiothoracic surgery if not already obtained. This recommendation follows 2010 ACCF/AHA/AATS/ACR/ASA/SCA/SCAI/SIR/STS/SVM Guidelines for the Diagnosis and Management of Patients With Thoracic Aortic Disease. Circulation. 2010; 121: I948-N462. No evident dissection. There is aortic atherosclerosis as well as foci of calcification in great vessels and several coronary arteries. 3. Prominence of the main pulmonary outflow tract consistent with pulmonary arterial hypertension. 4. Reflux of contrast into the inferior  vena cava and hepatic veins, likely indicative of increased right heart pressure. 5. Small pleural effusions bilaterally with bibasilar atelectatic change. No frank airspace consolidation. 6.  No appreciable thoracic adenopathy. 7.  Pacemaker lead attached to right ventricle. Critical Value/emergent results were called by telephone at the time of interpretation on 11/07/2017 at 12:20 pm to Dr. Kara Mead , who verbally acknowledged these results. Aortic aneurysm NOS (ICD10-I71.9). Aortic Atherosclerosis (ICD10-I70.0). Electronically Signed: By: Lowella Grip III M.D. On: 11/07/2017 12:21    EKG: Independently reviewed.  None available.  Assessment/Plan Present on Admission: . Pulmonary embolism (HCC)  Active Problems:   Pulmonary embolism (HCC)  Pulmonary embolism CTA PE positive for pulmonary embolism No sign of right heart strain Pharmacy consulted for heparin drip to  bridge with Coumadin.  Highly appreciated Monitor vital signs   Paget disease Outpatient follow-up  Recent left groin soft tissue biopsy Done due to extramammary Paget disease Wound care specialist to assess.  Highly appreciated  OSA CPAP at night  Hypertension Blood pressure stable Continue home medications  Chronic A. fib Resume Coumadin for CVA prophylaxis Rate controlled on metoprolol   DVT prophylaxis: Heparin drip/Coumadin  Code Status: Full code  Family Communication: None at bedside  Disposition Plan: Admit to telemetry  Consults called: Pharmacy  Admission status: Inpatient status    Kayleen Memos MD [facility] Pager [phone]  If 7PM-7AM, please contact night-coverage [email or web address] Password [facility]  11/07/2017, 5:38 PM

## 2017-11-07 NOTE — Progress Notes (Signed)
ANTICOAGULATION CONSULT NOTE - Initial Consult  Pharmacy Consult for heparin Indication: pulmonary embolus  No Known Allergies  Patient Measurements: Height: 6\' 3"  (190.5 cm) Weight: 272 lb 14.4 oz (123.8 kg) IBW/kg (Calculated) : 84.5 Heparin Dosing Weight: 111kg  Vital Signs: Temp: 97.5 F (36.4 C) (04/18 1607) Temp Source: Oral (04/18 1607) BP: 118/73 (04/18 1607) Pulse Rate: 65 (04/18 1607)  Labs: Recent Labs    11/07/17 1152  CREATININE 1.00    Estimated Creatinine Clearance: 84.9 mL/min (by C-G formula based on SCr of 1 mg/dL).   Medical History: Past Medical History:  Diagnosis Date  . Arthritis    back and neck  . Atrial fibrillation ([place]) 10/10/2012  . Chronic back pain   . Gout   . HTN (hypertension) 05/01/2013  . Hyperlipidemia   . Hypertension   . OSA on CPAP    wears CPAP nightly  . Pacemaker   . Pacemaker -[place] 2010 05/01/2013  . Paget disease, extramammary    left groin  . Permanent atrial fibrillation [facility])     Assessment: 49 YOM presents following outpatient CTA chest that reveals PE.  Patient seen earlier as outpatient by Dr. Elsworth Soho.   He presented to office with hemoptysis.  Appears he was on warfarin for history of atrial fibrillation but warfarin has been held for the last month due to ongoing hemoptysis.  He underwent excision of groin extramammary Paget's disease on 3/5 and 4/2.    Today, 11/07/2017  No current CBC or baseline coags available  Goal of Therapy:  Heparin level 0.3-0.7 units/ml Monitor platelets by anticoagulation protocol: Yes   Plan:   Once have baseline labs, start heparin 4000 unit bolus then heparin gtt at 1800 units/hr  Check 8h heparin level  Daily Heparin level and CBC  Per notes, plan will be to resume warfarin and complete required overlap therapy for acute VTE.    Doreene Eland, PharmD, BCPS.   Pager: [phone] 11/07/2017 5:04 PM   Addendum:  Baseline CBC reveals Hgb and  pltc WNL INR = 1.15  Plan - proceed with orders as above.   Doreene Eland, PharmD, BCPS.   Pager: [phone] 11/07/2017 6:18 PM

## 2017-11-07 NOTE — Progress Notes (Signed)
Subjective:    Patient ID: Cameron Sharp, male    DOB: 27-Oct-1935, 82 y.o.   MRN: [id number]  HPI 82 year old remote smoker presents for evaluation of hemoptysis. He has atrial fibrillation and is maintained on Coumadin.  He was diagnosed with extramammary Paget's disease of his groin and underwent several excision initially 3/7 and then again on 4/2 for positive margins.  On this last surgery he was orally intubated and margins appear clear.  About 3 days after surgery he developed blood-tinged sputum, bright red, few specks at the time and this has persisted since then, last episode yesterday.  He saw his PCP Dr. Maudie Mercury on 4/8 him a chest x-ray did not show any infiltrates or effusions, he was started on azithromycin. Of note his Coumadin has been held for more than a month.  Surgeon Laurie Panda restarting the Coumadin but the hemoptysis this is been held.  He denies leg tenderness.  He reports one episode of right infraclavicular chest pain with seem to subside spontaneously and also bilateral pain in his rib cage from coughing.  He denies dyspnea or palpitations. He admits to decreased mobility since. Surgery and ambulates with a cane at baseline. He is in a wheelchair today accompanied by his wife [name].  Past medical history of a bad back and he sleeps in a recliner, OSA on CPAP, hypertension, atrial fibrillation on Coumadin Past surgical history as above, cholecystectomy. He smoked about 40 pack years before he quit in 1980.  He is a retired Biomedical scientist. Family history of stomach cancer in his father, lung and prostate cancer in his grandfather  Past Medical History:  Diagnosis Date  . Arthritis    back and neck  . Atrial fibrillation ([place]) 10/10/2012  . Chronic back pain   . Gout   . HTN (hypertension) 05/01/2013  . Hyperlipidemia   . Hypertension   . OSA on CPAP    wears CPAP nightly  . Pacemaker   . Pacemaker -[place] 2010 05/01/2013  . Paget  disease, extramammary    left groin  . Permanent atrial fibrillation [facility])     Past Surgical History:  Procedure Laterality Date  . carpel tunnel surgery Right 2012  . CHOLECYSTECTOMY    . COLONOSCOPY    . CYST EXCISION Right 05/08/2017   Procedure: REEXCISION OF EXTRA MAMARY PAGETS DISEASE OF RIGHT GROIN;  Surgeon: [name], DO;  Location: [place];  Service: Plastics;  Laterality: Right;  . EXCISION MASS LOWER EXTREMETIES Left 09/26/2017   Procedure: Excision of left groin extra mammary Paget's disease;  Surgeon: [name], DO;  Location: [place];  Service: Plastics;  Laterality: Left;  . EXCISION MASS LOWER EXTREMETIES Left 10/22/2017   Procedure: RE-EXCISION OF LEFT GROIN EXTRA MAMMARY PAGET'S DISEASE;  Surgeon: [name], DO;  Location: [place];  Service: Plastics;  Laterality: Left;  . GALLBLADDER SURGERY  12/2006  . LESION EXCISION WITH COMPLEX REPAIR Bilateral 07/31/2017   Procedure: BIOPSY OF BILATERAL GROIN  CHANGING SKIN LESIONS;  Surgeon: [name], DO;  Location: [place];  Service: Plastics;  Laterality: Bilateral;  . LIPOMA EXCISION N/A 11/01/2016   Procedure: EXCISION OF SEBACEOUS CYST ON BACK;  Surgeon: [name], DO;  Location: [place];  Service: Plastics;  Laterality: N/A;  . MASS EXCISION N/A 11/01/2016   Procedure: EXCISION OF RIGHT GROIN SKIN LESION;  Surgeon: [name], DO;  Location:  [place];  Service: Plastics;  Laterality: N/A;  . MASS EXCISION Right 11/15/2016   Procedure: EXCISION POSITIVE MARGIN RIGHT GROIN;  Surgeon: [name], DO;  Location: [place];  Service: Plastics;  Laterality: Right;  . MASS EXCISION Right 04/04/2017   Procedure: REEXCISION OF EXTRA MAMARY PAGETS SKIN DISEASE OF RIGHT GROIN;  Surgeon: [name], DO;  Location: [place];   Service: Plastics;  Laterality: Right;  . NM MYOCAR PERF WALL MOTION  05/22/07   no significant ischemia  . PERMANENT PACEMAKER INSERTION  01/09/2009   [place]  . TONSILLECTOMY     age 31  . US ECHOCARDIOGRAPHY  11/24/07   mild LVH,LA & RA mod to severely dilated,mild mitral annular ca+,mild TR,mild Pulmonary hypertensio,AOV mod. sclerotic,mild AI w/root dilatation and ca+.    No Known Allergies  Social History   Socioeconomic History  . Marital status: Married    Spouse name: Not on file  . Number of children: Not on file  . Years of education: Not on file  . Highest education level: Not on file  Occupational History  . Not on file  Social Needs  . Financial resource strain: Not on file  . Food insecurity:    Worry: Not on file    Inability: Not on file  . Transportation needs:    Medical: Not on file    Non-medical: Not on file  Tobacco Use  . Smoking status: Former Smoker    Packs/day: 3.00    Years: 13.00    Pack years: 39.00    Last attempt to quit: 07/23/1976    Years since quitting: 41.3  . Smokeless tobacco: Never Used  Substance and Sexual Activity  . Alcohol use: Yes    Alcohol/week: 0.0 oz    Comment: 2oz scotch daily  . Drug use: No  . Sexual activity: Not on file  Lifestyle  . Physical activity:    Days per week: Not on file    Minutes per session: Not on file  . Stress: Not on file  Relationships  . Social connections:    Talks on phone: Not on file    Gets together: Not on file    Attends religious service: Not on file    Active member of club or organization: Not on file    Attends meetings of clubs or organizations: Not on file    Relationship status: Not on file  . Intimate partner violence:    Fear of current or ex partner: Not on file    Emotionally abused: Not on file    Physically abused: Not on file    Forced sexual activity: Not on file  Other Topics Concern  . Not on file  Social History Narrative  . Not on file     Family  History  Problem Relation Age of Onset  . Stomach cancer Father   . Lung cancer Maternal Grandfather   . Breast cancer Paternal Grandmother   . Prostate cancer Paternal Grandfather       Review of Systems  Constitutional: Negative for fever and unexpected weight change.  HENT: Negative for congestion, dental problem, ear pain, nosebleeds, postnasal drip, rhinorrhea, sinus pressure, sneezing, sore throat and trouble swallowing.   Eyes: Negative for redness and itching.  Respiratory: Positive for cough. Negative for chest tightness, shortness of breath and wheezing.   Cardiovascular: Negative for palpitations and leg swelling.  Gastrointestinal: Negative for nausea and vomiting.  Genitourinary:  Negative for dysuria.  Musculoskeletal: Negative for joint swelling.  Skin: Negative for rash.  Allergic/Immunologic: Negative.  Negative for environmental allergies, food allergies and immunocompromised state.  Neurological: Negative for headaches.  Hematological: Does not bruise/bleed easily.  Psychiatric/Behavioral: Negative for dysphoric mood. The patient is not nervous/anxious.        Objective:   Physical Exam  Gen. Pleasant, obese, in no distress, flat affect ENT - no oral/dental source of bleed, no post nasal drip, class 2 airway Neck: No JVD, no thyromegaly, no carotid bruits Lungs: no use of accessory muscles, no dullness to percussion, decreased without rales or rhonchi  Cardiovascular: Rhythm irregular, heart sounds  normal, no murmurs or gallops, no peripheral edema Abdomen: soft and non-tender, no hepatosplenomegaly, BS normal. Musculoskeletal: No deformities, no cyanosis or clubbing, no calf tenderness , ocmpression 'duda' socks Neuro:  alert, non focal, no tremors       Assessment & Plan:

## 2017-11-07 NOTE — Assessment & Plan Note (Addendum)
Cause of hemoptysis unclear at this time.  This may have been related to trauma from mechanical ventilation and intubation but seems to have persisted a little too long.  He has been off Coumadin for long periods of concern here would be for pulmonary embolism.  His mobility has been limited over the past few weeks and no alternative cause of hemoptysis found, so this would be intermediate to high clinical probability. Would be best to proceed with CT angiogram today Based on the results we can clarify whether he needs to continue to hold his Coumadin  Meanwhile empiric cough suppressant such as Delsym 5 mL twice daily.  Low threshold to use narcotics if needed discussed suppressive

## 2017-11-07 NOTE — Telephone Encounter (Signed)
This has already been taken care of

## 2017-11-07 NOTE — Telephone Encounter (Signed)
Spoke with Dr. Elsworth Soho and he verbally approved to place order for I STAT Creatinine. I called Tedra Coupe and advised her that the order was placed. She could retrieve order and nothing further is needed.

## 2017-11-07 NOTE — Telephone Encounter (Signed)
Discuss CT angiogram results with patient showing, embolism and advised him of the need for admission.  He has just returned home from CT scan. We will try to obtain direct admission to telemetry bed at [facility]. Discussed with Dr. Nevada Crane from triad hospitalist who will admit Please call patient back when bed available

## 2017-11-07 NOTE — Patient Instructions (Signed)
CT angiogram chest to rule out blood clot in the lungs If this is negative, we can take a wait and watch approach and see if the bleeding stops. Stay off Coumadin for now

## 2017-11-07 NOTE — Progress Notes (Signed)
[name] is a 82 year old male with past medical history significant for Paget disease, chronic atrial fibrillation, recent surgery to his groin for which he was taken off Coumadin temporarily for about a month, who presented to pulmonology office with hemoptysis.  Dr. Elsworth Soho sent the patient for a CT chest angio which revealed lower lobe pulmonary artery emboli bilaterally. Left slightly more than on the right.  No evidence of right heart strain.  Dr. Elsworth Soho requested direct admission to start treatment for pulmonary embolism.    Upon arrival to the hospital the patient will be started on heparin drip and his Coumadin will be bridged by pharmacy to achieve a therapeutic level with INR between 2 and 3.  Patient will be admitted to the telemetry unit at [facility] as inpatient status.

## 2017-11-07 NOTE — Progress Notes (Signed)
Pts.Home CPAP set up/placed on by pt., SW added to humidifier, currently on room air, tolerating well, made aware to notify if assisstance needed.

## 2017-11-07 NOTE — Assessment & Plan Note (Signed)
His Coumadin now has been held for about a month.  If CT Cameron Sharp is negative we will ask him to continue to hold until hemoptysis subsides. If hemoptysis persists then we may need to proceed with inspection bronchoscopy

## 2017-11-07 NOTE — Telephone Encounter (Signed)
Please call Cameron Sharp.

## 2017-11-08 LAB — CBC
HEMATOCRIT: 39.7 % (ref 39.0–52.0)
HEMOGLOBIN: 13.1 g/dL (ref 13.0–17.0)
MCH: 31.9 pg (ref 26.0–34.0)
MCHC: 33 g/dL (ref 30.0–36.0)
MCV: 96.6 fL (ref 78.0–100.0)
Platelets: 263 10*3/uL (ref 150–400)
RBC: 4.11 MIL/uL — AB (ref 4.22–5.81)
RDW: 13.4 % (ref 11.5–15.5)
WBC: 7.8 10*3/uL (ref 4.0–10.5)

## 2017-11-08 LAB — COMPREHENSIVE METABOLIC PANEL
ALBUMIN: 3.2 g/dL — AB (ref 3.5–5.0)
ALT: 14 U/L — ABNORMAL LOW (ref 17–63)
ANION GAP: 8 (ref 5–15)
AST: 22 U/L (ref 15–41)
Alkaline Phosphatase: 57 U/L (ref 38–126)
BUN: 20 mg/dL (ref 6–20)
CHLORIDE: 108 mmol/L (ref 101–111)
CO2: 24 mmol/L (ref 22–32)
Calcium: 8.8 mg/dL — ABNORMAL LOW (ref 8.9–10.3)
Creatinine, Ser: 0.91 mg/dL (ref 0.61–1.24)
GFR calc Af Amer: >60 mL/min (ref >60)
GFR calc non Af Amer: >60 mL/min (ref >60)
GLUCOSE: 112 mg/dL — AB (ref 65–99)
POTASSIUM: 4.1 mmol/L (ref 3.5–5.1)
SODIUM: 140 mmol/L (ref 135–145)
TOTAL PROTEIN: 6.9 g/dL (ref 6.5–8.1)
Total Bilirubin: 0.8 mg/dL (ref 0.3–1.2)

## 2017-11-08 LAB — HEPARIN LEVEL (UNFRACTIONATED)
HEPARIN UNFRACTIONATED: 0.45 [IU]/mL (ref 0.30–0.70)
Heparin Unfractionated: 0.48 IU/mL (ref 0.30–0.70)

## 2017-11-08 MED ORDER — WARFARIN - PHARMACIST DOSING INPATIENT: Freq: Every day | Status: DC

## 2017-11-08 MED ORDER — GABAPENTIN 300 MG PO CAPS
600.0000 mg | ORAL_CAPSULE | Freq: Once | ORAL | Status: AC
  Administered 2017-11-08: 600 mg via ORAL

## 2017-11-08 MED ORDER — GABAPENTIN 300 MG PO CAPS
300.0000 mg | ORAL_CAPSULE | ORAL | Status: DC
  Administered 2017-11-09: 300 mg via ORAL
  Filled 2017-11-08: qty 1

## 2017-11-08 MED ORDER — HYDROCODONE-ACETAMINOPHEN 5-325 MG PO TABS: 1.0000 | ORAL_TABLET | Freq: Four times a day (QID) | ORAL | Status: DC | PRN

## 2017-11-08 MED ORDER — WARFARIN SODIUM 5 MG PO TABS
7.5000 mg | ORAL_TABLET | Freq: Once | ORAL | Status: AC
  Administered 2017-11-08: 7.5 mg via ORAL
  Filled 2017-11-08: qty 1

## 2017-11-08 MED ORDER — DOCUSATE SODIUM 100 MG PO CAPS
200.0000 mg | ORAL_CAPSULE | Freq: Two times a day (BID) | ORAL | Status: DC
  Administered 2017-11-08 – 2017-11-09 (×3): 200 mg via ORAL
  Filled 2017-11-08 (×3): qty 2

## 2017-11-08 MED ORDER — GABAPENTIN 300 MG PO CAPS
600.0000 mg | ORAL_CAPSULE | ORAL | Status: DC
  Administered 2017-11-08 – 2017-11-09 (×2): 600 mg via ORAL
  Filled 2017-11-08: qty 6
  Filled 2017-11-08 (×2): qty 2

## 2017-11-08 NOTE — Progress Notes (Addendum)
[place] for heparin + warfarin Indication: pulmonary embolus  No Known Allergies  Patient Measurements: Height: 6\' 3"  (190.5 cm) Weight: 271 lb 2.7 oz (123 kg) IBW/kg (Calculated) : 84.5 Heparin Dosing Weight: 111kg  Vital Signs: Temp: 97.8 F (36.6 C) (04/19 0548) Temp Source: Oral (04/19 0548) BP: 128/64 (04/19 0548) Pulse Rate: 61 (04/19 0548)  Labs: Recent Labs    11/07/17 1152 11/07/17 1753 11/08/17 0236 11/08/17 1058  HGB  --  13.4 13.1  --   HCT  --  40.1 39.7  --   PLT  --  256 263  --   APTT  --  34  --   --   LABPROT  --  14.6  --   --   INR  --  1.15  --   --   HEPARINUNFRC  --   --  0.48 0.45  CREATININE 1.00  --  0.91  --     Estimated Creatinine Clearance: 93 mL/min (by C-G formula based on SCr of 0.91 mg/dL).   Assessment: 77 yoM presents following outpatient CTA chest from Dr. Bari Mantis office that reveals PE. Appears he was on warfarin for history of atrial fibrillation but was held starting in late March for multiple excisions of groin extramammary Paget's disease lesions. Patient delayed resuming coumadin d/t bleeding at excision sites, and several days later noticed intermittent hemoptysis. Seen by PCP and no issues found, but finally went to Dr. Elsworth Soho when hemoptysis did not resolve.    Baseline INR, aPTT: normal (subtherapeutic) as expected after holding warfarin > 1 month  Prior anticoagulation: warfarin 5 mg daily except 7.5 mg on Mondays; stopped in late March  CTA shows bilateral emboli L > R with no signs RH strain  Significant events:  4/19: Ok to resume warfarin per Pulmonary  Today, 11/08/2017:  CBC: stable WNL  Most recent heparin level remains therapeutic on 1800 units/hr  INR subtherapeutic  No bleeding or infusion issues per nursing  CrCl: 93 ml/min  Goal of Therapy: Heparin level 0.3-0.7 units/ml Monitor platelets by anticoagulation protocol: Yes  Plan:  Continue heparin IV  infusion at 18 units/hr  Daily CBC, heparin level, and INR  Warfarin 7.5 mg PO tonight x 1  Monitor for signs of bleeding or thrombosis   Reuel Boom, PharmD, BCPS [phone] 11/08/2017, 12:14 PM

## 2017-11-08 NOTE — Progress Notes (Signed)
ANTICOAGULATION CONSULT NOTE - f/u Consult  Pharmacy Consult for heparin Indication: pulmonary embolus  No Known Allergies  Patient Measurements: Height: 6\' 3"  (190.5 cm) Weight: 272 lb 14.4 oz (123.8 kg) IBW/kg (Calculated) : 84.5 Heparin Dosing Weight: 111kg  Vital Signs: Temp: 98.4 F (36.9 C) (04/18 2024) Temp Source: Oral (04/18 2024) BP: 123/71 (04/18 2024) Pulse Rate: 55 (04/18 2024)  Labs: Recent Labs    11/07/17 1152 11/07/17 1753 11/08/17 0236  HGB  --  13.4 13.1  HCT  --  40.1 39.7  PLT  --  256 263  APTT  --  34  --   LABPROT  --  14.6  --   INR  --  1.15  --   HEPARINUNFRC  --   --  0.48  CREATININE 1.00  --   --     Estimated Creatinine Clearance: 84.9 mL/min (by C-G formula based on SCr of 1 mg/dL).   Medical History: Past Medical History:  Diagnosis Date  . Arthritis    back and neck  . Atrial fibrillation ([place]) 10/10/2012  . Chronic back pain   . Gout   . HTN (hypertension) 05/01/2013  . Hyperlipidemia   . Hypertension   . OSA on CPAP    wears CPAP nightly  . Pacemaker   . Pacemaker -[place] 2010 05/01/2013  . Paget disease, extramammary    left groin  . Permanent atrial fibrillation [facility])     Assessment: 53 YOM presents following outpatient CTA chest that reveals PE.  Patient seen earlier as outpatient by Dr. Elsworth Soho.   He presented to office with hemoptysis.  Appears he was on warfarin for history of atrial fibrillation but warfarin has been held for the last month due to ongoing hemoptysis.  He underwent excision of groin extramammary Paget's disease on 3/5 and 4/2.    4/18  No current CBC or baseline coags available Today, 4/19  0236 HL=0.48 at goal, no bleeding or infusion issues per RN.  Goal of Therapy:  Heparin level 0.3-0.7 units/ml Monitor platelets by anticoagulation protocol: Yes   Plan:   Continue heparin drip at 1800 units/hr  Reheck 8h heparin level  Daily Heparin level and CBC  Per  notes, plan will be to resume warfarin and complete required overlap therapy for acute VTE.     Dorrene German 11/08/2017 3:17 AM

## 2017-11-08 NOTE — Consult Note (Signed)
[place] Nurse wound consult note Reason for Consult:Left groin biopsy site.  Healing wound.  Wife provides daily dry dressing change to left inguinal fold.   Wound type: surgical biopsy.  Has healed nicely with glue or secondary intention.  Only small opening at this time.  No depth to pack.  Right groin biopsy site has healed Pressure Injury POA: NA Measurement: Left inguinal 8 cm linear scar with nonintact center.  Serosanguinous weeping noted.  Wound bed:not visible Drainage (amount, consistency, odor) minimal serosanguinous no odor Periwound:Erythema from contact with wound effluent.  Dressing procedure/placement/frequency:Cleanse left inguinal fold with soap and water and pat gently dry.  Apply Interdry Ag to fold.  Measure and cut length of InterDry Ag+ to fit in skin folds that have skin breakdown Tuck InterDry  Ag+ fabric into skin folds in a single layer, allow for 2 inches of overhang from skin edges to allow for wicking to occur May remove to bathe; dry area thoroughly and then tuck into affected areas again Do not apply any creams or ointments when using InterDry Ag+ DO NOT THROW AWAY FOR 5 DAYS unless soiled with stool DO NOT [facility] product, this will inactivate the silver in the material  New sheet of Interdry Ag+ should be applied after 5 days of use if patient continues to have skin breakdown    Will not follow at this time.  Please re-consult if needed.  Domenic Moras RN BSN [place] Pager [phone]

## 2017-11-08 NOTE — Progress Notes (Signed)
[facility]  PROGRESS NOTE  Cameron Sharp GEX:[id number] DOB: 11-09-1935 DOA: 11/07/2017 PCP: [name], MD   Brief HPI:    82 y.o. male with medical history significant for Paget disease, chronic atrial fibrillation, recent soft tissue biopsy to his left groin and previously to the right groin. He was taken off Coumadin for about a month for the procedures.  He presented to his pulmonologist's office today with complaints of hemoptysis of 2-day duration off his Coumadin.    His pulmonologist Dr. Elsworth Soho sent him to [facility] radiology for CT chest angiogram which revealed lower lobe pulmonary artery emboli bilaterally, with no evidence of right heart strain     Subjective   Patient seen and examined, denies chest pain or shortness of breath.   Assessment/Plan:     1. Pulmonary embolism-CTA patient started on is positive for pulmonary embolism, heparin drip along with Coumadin. 2. Recent left groin soft tissue biopsy-done to rule extramammary Paget disease, wound care following. 3. Obstructive sleep apnea-continue CPAP at bedtime 4. Hypertension-blood pressure stable, continue metoprolol, HCTZ, irbesartan 5. Paget's disease-followed as outpatient 6. Chronic atrial fibrillation-Coumadin has been resumed, rate controlled on metoprolol.    DVT prophylaxis: Heparin  Code Status: Full code  Family Communication: No family at bedside  Disposition Plan: likely home when medically ready for discharge   Consultants:  None   Procedures:  None    Antibiotics:   Anti-infectives (From admission, onward)   None       Objective   Vitals:   11/07/17 2024 11/08/17 0548 11/08/17 0731 11/08/17 1258  BP: 123/71 128/64  137/74  Pulse: (!) 55 61  60  Resp: 20 18  18   Temp: 98.4 F (36.9 C) 97.8 F (36.6 C)  98.3 F (36.8 C)  TempSrc: Oral Oral  Oral  SpO2: 97% 94%  95%  Weight:   123 kg (271 lb 2.7 oz)   Height:        Intake/Output Summary (Last 24 hours) at  11/08/2017 1423 Last data filed at 11/08/2017 1011 Gross per 24 hour  Intake 250.9 ml  Output 600 ml  Net -349.1 ml   Filed Weights   11/07/17 1607 11/08/17 0731  Weight: 123.8 kg (272 lb 14.4 oz) 123 kg (271 lb 2.7 oz)     Physical Examination:   Physical Exam: Eyes: No icterus, extraocular muscles intact  Mouth: Oral mucosa is moist, no lesions on palate,  Neck: Supple, no deformities, masses, or tenderness Lungs: Normal respiratory effort, bilateral clear to auscultation, no crackles or wheezes.  Heart: Regular rate and rhythm, S1 and S2 normal, no murmurs, rubs auscultated Abdomen: BS normoactive,soft,nondistended,non-tender to palpation,no organomegaly Extremities: No pretibial edema, no erythema, no cyanosis, no clubbing Neuro : Alert and oriented to time, place and person, No focal deficits  Skin: No rashes seen on exam     Data Reviewed: I have personally reviewed following labs and imaging studies  CBG: No results for input(s): GLUCAP in the last 168 hours.  CBC: Recent Labs  Lab 11/07/17 1753 11/08/17 0236  WBC 7.9 7.8  HGB 13.4 13.1  HCT 40.1 39.7  MCV 96.6 96.6  PLT 256 010    Basic Metabolic Panel: Recent Labs  Lab 11/07/17 1152 11/08/17 0236  NA  --  140  K  --  4.1  CL  --  108  CO2  --  24  GLUCOSE  --  112*  BUN  --  20  CREATININE 1.00 0.91  CALCIUM  --  8.8*    No results found for this or any previous visit (from the past 240 hour(s)).   Liver Function Tests: Recent Labs  Lab 11/08/17 0236  AST 22  ALT 14*  ALKPHOS 57  BILITOT 0.8  PROT 6.9  ALBUMIN 3.2*      Studies: Ct Angio Chest W/cm &/or Wo Cm  Addendum Date: 11/07/2017   ADDENDUM REPORT: 11/07/2017 13:40 ADDENDUM: There is a stable 5 mm nodular opacity in the lateral segment of the left lower lobe seen on axial slice 96 series 6. Electronically Signed   By: Lowella Grip III M.D.   On: 11/07/2017 13:40   Result Date: 11/07/2017 CLINICAL DATA:  Hemoptysis  and shortness of breath EXAM: CT ANGIOGRAPHY CHEST WITH CONTRAST TECHNIQUE: Multidetector CT imaging of the chest was performed using the standard protocol during bolus administration of intravenous contrast. Multiplanar CT image reconstructions and MIPs were obtained to evaluate the vascular anatomy. CONTRAST:  178mL ISOVUE-370 IOPAMIDOL (ISOVUE-370) INJECTION 76% COMPARISON:  Chest CT September 24, 2016; chest radiograph November 07, 2017 FINDINGS: Cardiovascular: There are pulmonary emboli in multiple left and right lower lobe pulmonary arterial branches. Pulmonary emboli are somewhat more central in location on the left than on the right, although there is no pulmonary embolus in either main pulmonary artery. There is no evidence of right heart strain; right ventricle to left ventricle diameter ratio is less than 0.9. Pacemaker lead is attached to the right ventricle. There is prominence of the ascending thoracic aorta measuring 4.9 x 4.9 cm in transverse diameter. No dissection is evident. There is atherosclerotic calcification in the aorta as well as in the proximal great vessels. There are foci of coronary artery calcification. There is no pericardial effusion or pericardial thickening. The main pulmonary outflow tract is dilated measuring 3.7 cm, a finding indicative of pulmonary arterial hypertension. Mediastinum/Nodes: Thyroid appears normal. No appreciable thoracic adenopathy. No esophageal lesions are evident. Lungs/Pleura: There are small pleural effusions bilaterally, slightly larger on the right than on the left. There is patchy bibasilar atelectasis. There is no appreciable edema or consolidation. Upper Abdomen: There is atherosclerotic calcification in the upper abdominal aorta. There is reflux of contrast into the inferior vena cava and hepatic veins. Visualized upper abdominal structures otherwise appear unremarkable. Musculoskeletal: There is degenerative change in the thoracic spine with diffuse  idiopathic skeletal hyperostosis. No blastic or lytic bone lesions. Review of the MIP images confirms the above findings. IMPRESSION: 1. Lower lobe pulmonary artery emboli bilaterally, slightly more on the left than on the right. No more central pulmonary embolus. No right heart strain evident. 2. Dilatation of the ascending thoracic aorta measuring 4.9 x 4.9 cm. Ascending thoracic aortic aneurysm. Recommend semi-annual imaging followup by CTA or MRA and referral to cardiothoracic surgery if not already obtained. This recommendation follows 2010 ACCF/AHA/AATS/ACR/ASA/SCA/SCAI/SIR/STS/SVM Guidelines for the Diagnosis and Management of Patients With Thoracic Aortic Disease. Circulation. 2010; 121: I696-E952. No evident dissection. There is aortic atherosclerosis as well as foci of calcification in great vessels and several coronary arteries. 3. Prominence of the main pulmonary outflow tract consistent with pulmonary arterial hypertension. 4. Reflux of contrast into the inferior vena cava and hepatic veins, likely indicative of increased right heart pressure. 5. Small pleural effusions bilaterally with bibasilar atelectatic change. No frank airspace consolidation. 6.  No appreciable thoracic adenopathy. 7.  Pacemaker lead attached to right ventricle. Critical Value/emergent results were called by telephone at the time of interpretation on 11/07/2017 at 12:20 pm to Dr. Davonna Belling  ALVA , who verbally acknowledged these results. Aortic aneurysm NOS (ICD10-I71.9). Aortic Atherosclerosis (ICD10-I70.0). Electronically Signed: By: Lowella Grip III M.D. On: 11/07/2017 12:21    Scheduled Meds: . atorvastatin  20 mg Oral Daily  . baclofen  10 mg Oral BID  . docusate sodium  200 mg Oral BID  . febuxostat  40 mg Oral Daily  . [START ON 11/09/2017] gabapentin  300 mg Oral Q24H  . gabapentin  600 mg Oral 2 times per day  . irbesartan  150 mg Oral Daily   And  . hydrochlorothiazide  12.5 mg Oral Daily  . metoprolol  tartrate  12.5 mg Oral q morning - 10a  . vitamin B-1  250 mg Oral Daily      Time spent: 25 min  [facility] Pager [phone]. If 7PM-7AM, please contact night-coverage at [email or web address], Office  [phone]  password [email or web address]  11/08/2017, 2:23 PM  LOS: 1 day

## 2017-11-09 LAB — BASIC METABOLIC PANEL
ANION GAP: 11 (ref 5–15)
BUN: 17 mg/dL (ref 6–20)
CHLORIDE: 107 mmol/L (ref 101–111)
CO2: 23 mmol/L (ref 22–32)
Calcium: 9 mg/dL (ref 8.9–10.3)
Creatinine, Ser: 0.86 mg/dL (ref 0.61–1.24)
GFR calc Af Amer: >60 mL/min (ref >60)
GFR calc non Af Amer: >60 mL/min (ref >60)
Glucose, Bld: 93 mg/dL (ref 65–99)
POTASSIUM: 4 mmol/L (ref 3.5–5.1)
SODIUM: 141 mmol/L (ref 135–145)

## 2017-11-09 LAB — CBC
HEMATOCRIT: 40.3 % (ref 39.0–52.0)
HEMOGLOBIN: 13.5 g/dL (ref 13.0–17.0)
MCH: 32.4 pg (ref 26.0–34.0)
MCHC: 33.5 g/dL (ref 30.0–36.0)
MCV: 96.6 fL (ref 78.0–100.0)
Platelets: 262 10*3/uL (ref 150–400)
RBC: 4.17 MIL/uL — ABNORMAL LOW (ref 4.22–5.81)
RDW: 13.4 % (ref 11.5–15.5)
WBC: 6.7 10*3/uL (ref 4.0–10.5)

## 2017-11-09 LAB — HEPARIN LEVEL (UNFRACTIONATED): Heparin Unfractionated: 0.58 IU/mL (ref 0.30–0.70)

## 2017-11-09 LAB — PROTIME-INR
INR: 1.16
Prothrombin Time: 14.7 seconds (ref 11.4–15.2)

## 2017-11-09 MED ORDER — ENOXAPARIN SODIUM 150 MG/ML ~~LOC~~ SOLN
125.0000 mg | Freq: Two times a day (BID) | SUBCUTANEOUS | Status: DC
  Administered 2017-11-09: 125 mg via SUBCUTANEOUS
  Filled 2017-11-09: qty 0.83

## 2017-11-09 MED ORDER — WARFARIN SODIUM 5 MG PO TABS: 7.5000 mg | ORAL_TABLET | Freq: Once | ORAL | Status: DC

## 2017-11-09 MED ORDER — ENOXAPARIN SODIUM 120 MG/0.8ML ~~LOC~~ SOLN: 1.0000 mg/kg | Freq: Two times a day (BID) | SUBCUTANEOUS | 0 refills | Status: DC

## 2017-11-09 NOTE — Progress Notes (Signed)
[facility] for heparin + warfarin Indication: pulmonary embolus  No Known Allergies  Patient Measurements: Height: 6\' 3"  (190.5 cm) Weight: 271 lb 2.7 oz (123 kg) IBW/kg (Calculated) : 84.5 Heparin Dosing Weight: 111kg  Vital Signs: Temp: 97.6 F (36.4 C) (04/20 0701) Temp Source: Oral (04/20 0701) BP: 120/73 (04/20 0701) Pulse Rate: 58 (04/20 0701)  Labs: Recent Labs    11/07/17 1152  11/07/17 1753 11/08/17 0236 11/08/17 1058 11/09/17 0529  HGB  --    < > 13.4 13.1  --  13.5  HCT  --   --  40.1 39.7  --  40.3  PLT  --   --  256 263  --  262  APTT  --   --  34  --   --   --   LABPROT  --   --  14.6  --   --  14.7  INR  --   --  1.15  --   --  1.16  HEPARINUNFRC  --   --   --  0.48 0.45 0.58  CREATININE 1.00  --   --  0.91  --  0.86   < > = values in this interval not displayed.    Estimated Creatinine Clearance: 98.4 mL/min (by C-G formula based on SCr of 0.86 mg/dL).   Assessment: 71 yoM presents following outpatient CTA chest from Dr. Bari Mantis office that reveals PE. Appears he was on warfarin for history of atrial fibrillation but was held starting in late March for multiple excisions of groin extramammary Paget's disease lesions. Patient delayed resuming coumadin d/t bleeding at excision sites, and several days later noticed intermittent hemoptysis. Seen by PCP and no issues found, but finally went to Dr. Elsworth Soho when hemoptysis did not resolve.    Baseline INR, aPTT: normal (subtherapeutic) as expected after holding warfarin > 1 month  Prior anticoagulation: warfarin 5 mg daily except 7.5 mg on Mondays; stopped in late March  CTA shows bilateral emboli L > R with no signs RH strain  Significant events:  4/19: Ok to resume warfarin per Pulmonary  Today, 11/09/2017:  CBC: stable WNL  heparin level remains therapeutic on 1800 units/hr  INR subtherapeutic as expected after only 1 dose of coumadin  No bleeding or infusion  issues per nursing  Goal of Therapy: Heparin level 0.3-0.7 units/ml Monitor platelets by anticoagulation protocol: Yes  Plan:  Continue heparin IV infusion at 18 units/hr  Daily CBC, heparin level, and INR  Warfarin 7.5 mg PO tonight x 1  Monitor for signs of bleeding or thrombosis  Consider LMWH bridge for DC home   Eudelia Bunch, Pharm.D. [phone] 11/09/2017 8:13 AM

## 2017-11-09 NOTE — Discharge Summary (Signed)
Physician Discharge Summary  Cameron Sharp WGY:[id number] DOB: 1936-01-09 DOA: 11/07/2017  PCP: [name], MD  Admit date: 11/07/2017 Discharge date: 11/09/2017  Time spent: 35 minutes  Recommendations for Outpatient Follow-up:  1. Follow up PCP in 2 weeks 2. Take Lovenox twice a day till INR is therapeutic greater than 2.0   Discharge Diagnoses:  Active Problems:   Pulmonary embolism (HCC)   Paget disease of bone   Atrial fibrillation, chronic ([place])   Discharge Condition: Stable  Diet recommendation: Regular diet  Filed Weights   11/07/17 1607 11/08/17 0731  Weight: 123.8 kg (272 lb 14.4 oz) 123 kg (271 lb 2.7 oz)    History of present illness:   82 y.o.malewith medical history significant forPaget disease, chronic atrial fibrillation, recentsoft tissue biopsyto his leftgroinand previously to the right groin. He was taken off Coumadin for about a monthfor the procedures. He presented to his pulmonologist's office today with complaints of hemoptysis of 2-day duration offhis Coumadin.   His pulmonologist Dr[facility] which revealed lower lobe pulmonary artery emboli bilaterally,with no evidence of right heart strain    Hospital Course:  1. Pulmonary embolism-CTA patient started on is positive for pulmonary embolism, heparin drip along with Coumadin was started, discussed with Dr Elsworth Soho, patient will be discharged home on Coumadin with Lovenox bridging. 2. Recent left groin soft tissue biopsy-done to rule extramammary Paget disease, wound care following. 3. Obstructive sleep apnea-continue CPAP at bedtime 4. Hypertension-blood pressure stable, continue metoprolol, HCTZ, irbesartan 5. Paget's disease-followed as outpatient 6. Chronic atrial fibrillation-Coumadin has been resumed, rate controlled on metoprolol.     Procedures:  None   Consultations:  None   Discharge Exam: Vitals:   11/09/17 0701 11/09/17  1005  BP: 120/73   Pulse: (!) 58 72  Resp: 18   Temp: 97.6 F (36.4 C)   SpO2: 95%     General: Appears in no acute distress Cardiovascular: S1S2 RRR Respiratory: Clear bilaterally  Discharge Instructions   Discharge Instructions    Diet - low sodium heart healthy   Complete by:  As directed    Discharge instructions   Complete by:  As directed    Stop taking Lovenox once INR is greater than 2.0   Increase activity slowly   Complete by:  As directed      Allergies as of 11/09/2017   No Known Allergies     Medication List    STOP taking these medications   azithromycin 250 MG tablet Commonly known as:  ZITHROMAX     TAKE these medications   atorvastatin 20 MG tablet Commonly known as:  LIPITOR TAKE 1 TABLET DAILY   baclofen 10 MG tablet Commonly known as:  LIORESAL Take 1 tablet by mouth 2 (two) times daily.   COUMADIN 5 MG tablet Generic drug:  warfarin Take as directed. If you are unsure how to take this medication, talk to your nurse or doctor. Original instructions:  TAKE 1 TABLET DAILY OR AS DIRECTED   docusate sodium 100 MG capsule Commonly known as:  COLACE Take 200-300 mg by mouth 2 (two) times daily.   enoxaparin 120 MG/0.8ML injection Commonly known as:  LOVENOX Inject 0.82 mLs (125 mg total) into the skin 2 (two) times daily for 10 days.   gabapentin 300 MG capsule Commonly known as:  NEURONTIN Take by mouth 3 (three) times daily. PATIENT TAKES 600MG  IN AM, 300MG  AT MID DAY, 600 AT BEDTIME   HYDROcodone-acetaminophen 5-325  MG tablet Commonly known as:  NORCO Take 1 tablet by mouth every 6 (six) hours as needed for moderate pain.   metoprolol tartrate 25 MG tablet Commonly known as:  LOPRESSOR Take 0.5 tablets (12.5 mg total) by mouth every morning.   ULORIC 40 MG tablet Generic drug:  febuxostat Take 1 tablet (40 mg total) by mouth daily.   valsartan-hydrochlorothiazide 160-12.5 MG tablet Commonly known as:  DIOVAN-HCT TAKE 1  TABLET DAILY   vitamin B-1 250 MG tablet Take 250 mg by mouth daily.      No Known Allergies Follow-up Information    [name], MD Follow up in 2 week(s).   Specialty:  Internal Medicine Contact information: [address] [place] [address] [phone]        Rigoberto Noel, MD. Schedule an appointment as soon as possible for a visit in 2 week(s).   Specialty:  Pulmonary Disease Contact information: [address] [address] [phone]            The results of significant diagnostics from this hospitalization (including imaging, microbiology, ancillary and laboratory) are listed below for reference.    Significant Diagnostic Studies: Ct Angio Chest W/cm &/or Wo Cm  Addendum Date: 11/07/2017   ADDENDUM REPORT: 11/07/2017 13:40 ADDENDUM: There is a stable 5 mm nodular opacity in the lateral segment of the left lower lobe seen on axial slice 96 series 6. Electronically Signed   By: Lowella Grip III M.D.   On: 11/07/2017 13:40   Result Date: 11/07/2017 CLINICAL DATA:  Hemoptysis and shortness of breath EXAM: CT ANGIOGRAPHY CHEST WITH CONTRAST TECHNIQUE: Multidetector CT imaging of the chest was performed using the standard protocol during bolus administration of intravenous contrast. Multiplanar CT image reconstructions and MIPs were obtained to evaluate the vascular anatomy. CONTRAST:  170mL ISOVUE-370 IOPAMIDOL (ISOVUE-370) INJECTION 76% COMPARISON:  Chest CT September 24, 2016; chest radiograph November 07, 2017 FINDINGS: Cardiovascular: There are pulmonary emboli in multiple left and right lower lobe pulmonary arterial branches. Pulmonary emboli are somewhat more central in location on the left than on the right, although there is no pulmonary embolus in either main pulmonary artery. There is no evidence of right heart strain; right ventricle to left ventricle diameter ratio is less than 0.9. Pacemaker lead is attached to the right ventricle.  There is prominence of the ascending thoracic aorta measuring 4.9 x 4.9 cm in transverse diameter. No dissection is evident. There is atherosclerotic calcification in the aorta as well as in the proximal great vessels. There are foci of coronary artery calcification. There is no pericardial effusion or pericardial thickening. The main pulmonary outflow tract is dilated measuring 3.7 cm, a finding indicative of pulmonary arterial hypertension. Mediastinum/Nodes: Thyroid appears normal. No appreciable thoracic adenopathy. No esophageal lesions are evident. Lungs/Pleura: There are small pleural effusions bilaterally, slightly larger on the right than on the left. There is patchy bibasilar atelectasis. There is no appreciable edema or consolidation. Upper Abdomen: There is atherosclerotic calcification in the upper abdominal aorta. There is reflux of contrast into the inferior vena cava and hepatic veins. Visualized upper abdominal structures otherwise appear unremarkable. Musculoskeletal: There is degenerative change in the thoracic spine with diffuse idiopathic skeletal hyperostosis. No blastic or lytic bone lesions. Review of the MIP images confirms the above findings. IMPRESSION: 1. Lower lobe pulmonary artery emboli bilaterally, slightly more on the left than on the right. No more central pulmonary embolus. No right heart strain evident. 2. Dilatation  of the ascending thoracic aorta measuring 4.9 x 4.9 cm. Ascending thoracic aortic aneurysm. Recommend semi-annual imaging followup by CTA or MRA and referral to cardiothoracic surgery if not already obtained. This recommendation follows 2010 ACCF/AHA/AATS/ACR/ASA/SCA/SCAI/SIR/STS/SVM Guidelines for the Diagnosis and Management of Patients With Thoracic Aortic Disease. Circulation. 2010; 121: D428-J681. No evident dissection. There is aortic atherosclerosis as well as foci of calcification in great vessels and several coronary arteries. 3. Prominence of the main  pulmonary outflow tract consistent with pulmonary arterial hypertension. 4. Reflux of contrast into the inferior vena cava and hepatic veins, likely indicative of increased right heart pressure. 5. Small pleural effusions bilaterally with bibasilar atelectatic change. No frank airspace consolidation. 6.  No appreciable thoracic adenopathy. 7.  Pacemaker lead attached to right ventricle. Critical Value/emergent results were called by telephone at the time of interpretation on 11/07/2017 at 12:20 pm to Dr. Kara Mead , who verbally acknowledged these results. Aortic aneurysm NOS (ICD10-I71.9). Aortic Atherosclerosis (ICD10-I70.0). Electronically Signed: By: Lowella Grip III M.D. On: 11/07/2017 12:21    Microbiology: No results found for this or any previous visit (from the past 240 hour(s)).   Labs: Basic Metabolic Panel: Recent Labs  Lab 11/07/17 1152 11/08/17 0236 11/09/17 0529  NA  --  140 141  K  --  4.1 4.0  CL  --  108 107  CO2  --  24 23  GLUCOSE  --  112* 93  BUN  --  20 17  CREATININE 1.00 0.91 0.86  CALCIUM  --  8.8* 9.0   Liver Function Tests: Recent Labs  Lab 11/08/17 0236  AST 22  ALT 14*  ALKPHOS 57  BILITOT 0.8  PROT 6.9  ALBUMIN 3.2*   No results for input(s): LIPASE, AMYLASE in the last 168 hours. No results for input(s): AMMONIA in the last 168 hours. CBC: Recent Labs  Lab 11/07/17 1753 11/08/17 0236 11/09/17 0529  WBC 7.9 7.8 6.7  HGB 13.4 13.1 13.5  HCT 40.1 39.7 40.3  MCV 96.6 96.6 96.6  PLT 256 263 262   Cardiac Enzymes: No results for input(s): CKTOTAL, CKMB, CKMBINDEX, TROPONINI in the last 168 hours. BNP: BNP (last 3 results) No results for input(s): BNP in the last 8760 hours.  ProBNP (last 3 results) No results for input(s): PROBNP in the last 8760 hours.  CBG: No results for input(s): GLUCAP in the last 168 hours.     Signed:  Oswald Hillock MD.  [facility] 11/09/2017, 11:07 AM

## 2017-11-09 NOTE — Progress Notes (Signed)
Patient and patient's wife educated on Lovenox injections. Wife return demonstrated understanding of administration of Lovenox. D/C paperwork discussed. Patient states "we know where to go get the Lovenox the pharmacy called [place]." denies any further needs.

## 2017-11-09 NOTE — Care Management (Signed)
Spoke to pharmacist at [facility].  Lovenox script will cost $5 but it is not in stock at that store.  Pharmacist will notify patient what store will have Lovenox available. Pt updated.

## 2017-11-11 ENCOUNTER — Telehealth: Payer: Self-pay | Admitting: Pulmonary Disease

## 2017-11-11 ENCOUNTER — Ambulatory Visit (INDEPENDENT_AMBULATORY_CARE_PROVIDER_SITE_OTHER): Payer: Medicare Other | Admitting: Pharmacist

## 2017-11-11 DIAGNOSIS — I482 Chronic atrial fibrillation: Secondary | ICD-10-CM

## 2017-11-11 DIAGNOSIS — I4821 Permanent atrial fibrillation: Secondary | ICD-10-CM

## 2017-11-11 DIAGNOSIS — I2699 Other pulmonary embolism without acute cor pulmonale: Secondary | ICD-10-CM | POA: Diagnosis not present

## 2017-11-11 DIAGNOSIS — Z7901 Long term (current) use of anticoagulants: Secondary | ICD-10-CM

## 2017-11-11 LAB — POCT INR: INR: 1.3

## 2017-11-11 NOTE — Telephone Encounter (Signed)
No additional recommendations. Continue Lovenox injections until INR becomes 2 and above

## 2017-11-11 NOTE — Telephone Encounter (Signed)
Wants to keep his appt for 05/08.  He is just calling with update for Dr. Elsworth Soho.

## 2017-11-11 NOTE — Telephone Encounter (Signed)
Called spoke with patient who reported that he is improving since discharge, and does feel that his hemoptysis has improved, though he is aware this may continue to linger (as reported to him by Dr Elsworth Soho).  Pt stated that he is just calling with an update >> he is continuing with the Lovenox bridge as instructed.  His INR this morning was 1.3.  He has contacted cardiology with this result and is awaiting a return call with further dosing instructions.  Pt is already scheduled for follow up with Dr Elsworth Soho on 5.8.19 and will keep this appt Pt is aware to contact the office if his symptoms do not continue to improve or to seek emergency attention if his symptoms worsen  Routing to Dr Elsworth Soho as Juluis Rainier and to see if there are any additional recommendations for pt

## 2017-11-13 ENCOUNTER — Ambulatory Visit (INDEPENDENT_AMBULATORY_CARE_PROVIDER_SITE_OTHER): Payer: Medicare Other | Admitting: Pharmacist

## 2017-11-13 DIAGNOSIS — Z7901 Long term (current) use of anticoagulants: Secondary | ICD-10-CM

## 2017-11-13 DIAGNOSIS — I2699 Other pulmonary embolism without acute cor pulmonale: Secondary | ICD-10-CM | POA: Diagnosis not present

## 2017-11-13 DIAGNOSIS — I482 Chronic atrial fibrillation: Secondary | ICD-10-CM

## 2017-11-13 DIAGNOSIS — I4821 Permanent atrial fibrillation: Secondary | ICD-10-CM

## 2017-11-13 LAB — POCT INR: INR: 1.8

## 2017-11-13 NOTE — Telephone Encounter (Signed)
He stays on Lovenox 48 hours after INR becomes 2.0 and above

## 2017-11-13 NOTE — Telephone Encounter (Signed)
Will route to RA.

## 2017-11-13 NOTE — Telephone Encounter (Signed)
Patient calling with another update on INR.  States this was morning it was 1.8.  Call back is [phone].

## 2017-11-13 NOTE — Telephone Encounter (Signed)
ATC pt- no answer with no option to leave vm-received recording stating that "voicemail is off". Will call back

## 2017-11-14 NOTE — Telephone Encounter (Signed)
Called and spoke to patient. Let him know that Dr. Elsworth Soho advises that he stay on Lovenox for 48 hours after his INR is 2.0 or above. Patient verbalized understanding and has no questions at this time. Nothing further is needed.

## 2017-11-15 ENCOUNTER — Ambulatory Visit (INDEPENDENT_AMBULATORY_CARE_PROVIDER_SITE_OTHER): Payer: Medicare Other | Admitting: Pharmacist

## 2017-11-15 ENCOUNTER — Other Ambulatory Visit: Payer: Self-pay | Admitting: Pharmacist

## 2017-11-15 ENCOUNTER — Telehealth: Payer: Self-pay | Admitting: Pulmonary Disease

## 2017-11-15 DIAGNOSIS — I4821 Permanent atrial fibrillation: Secondary | ICD-10-CM

## 2017-11-15 DIAGNOSIS — Z7901 Long term (current) use of anticoagulants: Secondary | ICD-10-CM

## 2017-11-15 DIAGNOSIS — I2699 Other pulmonary embolism without acute cor pulmonale: Secondary | ICD-10-CM

## 2017-11-15 LAB — POCT INR: INR: 2.3

## 2017-11-15 MED ORDER — COUMADIN 5 MG PO TABS: ORAL_TABLET | ORAL | 1 refills | Status: DC

## 2017-11-15 NOTE — Telephone Encounter (Signed)
Spoke with pt he wanted to update Dr. Elsworth Soho to give INR results.  2.3  FYI

## 2017-11-15 NOTE — Telephone Encounter (Signed)
Can stop lovenox after tomorrow Please ensure he has FU appt with TP/ me in 2 weeks

## 2017-11-15 NOTE — Telephone Encounter (Signed)
Called pt and advised message from the provider. Pt understood and verbalized understanding. Nothing further is needed.   He has an appt on 5/8 with RA.

## 2017-11-21 ENCOUNTER — Ambulatory Visit (INDEPENDENT_AMBULATORY_CARE_PROVIDER_SITE_OTHER): Payer: Medicare Other | Admitting: Pharmacist

## 2017-11-21 DIAGNOSIS — I482 Chronic atrial fibrillation: Secondary | ICD-10-CM

## 2017-11-21 DIAGNOSIS — I2699 Other pulmonary embolism without acute cor pulmonale: Secondary | ICD-10-CM | POA: Diagnosis not present

## 2017-11-21 DIAGNOSIS — Z7901 Long term (current) use of anticoagulants: Secondary | ICD-10-CM | POA: Diagnosis not present

## 2017-11-21 DIAGNOSIS — I4821 Permanent atrial fibrillation: Secondary | ICD-10-CM

## 2017-11-21 DIAGNOSIS — Z952 Presence of prosthetic heart valve: Secondary | ICD-10-CM | POA: Diagnosis not present

## 2017-11-21 LAB — POCT INR: INR: 3.5

## 2017-11-25 ENCOUNTER — Ambulatory Visit (INDEPENDENT_AMBULATORY_CARE_PROVIDER_SITE_OTHER): Payer: Medicare Other | Admitting: Pharmacist Clinician (PhC)/ Clinical Pharmacy Specialist

## 2017-11-25 DIAGNOSIS — I2699 Other pulmonary embolism without acute cor pulmonale: Secondary | ICD-10-CM | POA: Diagnosis not present

## 2017-11-25 DIAGNOSIS — I482 Chronic atrial fibrillation, unspecified: Secondary | ICD-10-CM

## 2017-11-25 DIAGNOSIS — I4821 Permanent atrial fibrillation: Secondary | ICD-10-CM

## 2017-11-25 DIAGNOSIS — Z7901 Long term (current) use of anticoagulants: Secondary | ICD-10-CM

## 2017-11-25 LAB — POCT INR: INR: 2.4

## 2017-11-26 DIAGNOSIS — Z7901 Long term (current) use of anticoagulants: Secondary | ICD-10-CM | POA: Diagnosis not present

## 2017-11-26 DIAGNOSIS — Z4801 Encounter for change or removal of surgical wound dressing: Secondary | ICD-10-CM | POA: Diagnosis not present

## 2017-11-26 DIAGNOSIS — Z8549 Personal history of malignant neoplasm of other male genital organs: Secondary | ICD-10-CM | POA: Diagnosis not present

## 2017-11-26 DIAGNOSIS — I2699 Other pulmonary embolism without acute cor pulmonale: Secondary | ICD-10-CM | POA: Diagnosis not present

## 2017-11-27 ENCOUNTER — Ambulatory Visit (INDEPENDENT_AMBULATORY_CARE_PROVIDER_SITE_OTHER): Payer: Medicare Other | Admitting: Pulmonary Disease

## 2017-11-27 ENCOUNTER — Telehealth: Payer: Self-pay | Admitting: Pulmonary Disease

## 2017-11-27 ENCOUNTER — Encounter: Payer: Self-pay | Admitting: Pulmonary Disease

## 2017-11-27 VITALS — BP 120/84 | HR 60 | Ht 75.0 in | Wt 275.4 lb

## 2017-11-27 DIAGNOSIS — I2699 Other pulmonary embolism without acute cor pulmonale: Secondary | ICD-10-CM

## 2017-11-27 DIAGNOSIS — G4733 Obstructive sleep apnea (adult) (pediatric): Secondary | ICD-10-CM

## 2017-11-27 DIAGNOSIS — Z9989 Dependence on other enabling machines and devices: Secondary | ICD-10-CM

## 2017-11-27 NOTE — Telephone Encounter (Signed)
Spoke with patient. He was calling about his cpap supplies. Advised patient that the order has already been placed. He verbalized understanding. Nothing else needed at time of call.

## 2017-11-27 NOTE — Progress Notes (Signed)
   Subjective:    Patient ID: Cameron Sharp, male    DOB: 1935-12-05, 82 y.o.   MRN: [id number]  HPI 82 year old remote smoker for follow-up of pulmonary embolism. This was diagnosed 10/2017 when he presented with hemoptysis and cough following decreased mobility due to surgery for Paget's disease involving groin.  He had stopped his Coumadin around the surgery  Past medical history of a bad back and he sleeps in a recliner, OSA on CPAP, hypertension, atrial fibrillation on Coumadin  His Coumadin levels are therapeutic now and he feels much improved, dyspnea is resolved , no more hemoptysis.  INR jumped up to 3.5 but is now back down to 2.4  OSA was diagnosed in [place] many years ago and is been maintained on CPAP 14 cm since then with a full facemask.  He reports mass digging into bridge of nose.  CPAP download from the past was reviewed which shows excellent compliance. He has lost significant weight over the past 10 years, about 75 pounds  Significant tests/ events reviewed  NPSG 2009 >> AHI 38/h, wt 350 lbs  Review of Systems neg for any significant sore throat, dysphagia, itching, sneezing, nasal congestion or excess/ purulent secretions, fever, chills, sweats, unintended wt loss, pleuritic or exertional cp, hempoptysis, orthopnea pnd or change in chronic leg swelling. Also denies presyncope, palpitations, heartburn, abdominal pain, nausea, vomiting, diarrhea or change in bowel or urinary habits, dysuria,hematuria, rash, arthralgias, visual complaints, headache, numbness weakness or ataxia.     Objective:   Physical Exam  Gen. Pleasant, obese, in no distress ENT - class 2 airway, no post nasal drip Neck: No JVD, no thyromegaly, no carotid bruits Lungs: no use of accessory muscles, no dullness to percussion, decreased without rales or rhonchi  Cardiovascular: Rhythm regular, heart sounds  normal, no murmurs or gallops, no peripheral edema Musculoskeletal: No deformities, no  cyanosis or clubbing , no tremors       Assessment & Plan:

## 2017-11-27 NOTE — Telephone Encounter (Signed)
There is no orders placed for this patient for Homecare

## 2017-11-27 NOTE — Assessment & Plan Note (Signed)
This was a provoked event since he was off Coumadin and had recent surgery .  He would only require anticoagulation for 6 months from an embolism standpoint.  however he will be on Coumadin for his atrial fibrillation lifelong

## 2017-11-27 NOTE — Patient Instructions (Signed)
Schedule home sleep test. Prescription for new air fit F 30 fullface mask will be sent to [facility] patient -let me know if this works for you.  Stay on anticoagulation for blood clot

## 2017-11-27 NOTE — Assessment & Plan Note (Signed)
Prescription for new air fit F 30 fullface mask will be sent to [facility] patient  Given his significant weight loss, we will repeat home sleep study to show if his OSA is improved.  I doubt that he will be able to come off CPAP completely

## 2017-11-27 NOTE — Telephone Encounter (Signed)
ATC patient but the call would not go through.   Order has been placed. Will close this encounter.

## 2017-11-27 NOTE — Telephone Encounter (Signed)
States he spoke with someone at [facility] Patient # [phone].

## 2017-11-28 ENCOUNTER — Telehealth: Payer: Self-pay | Admitting: Pulmonary Disease

## 2017-11-28 NOTE — Telephone Encounter (Signed)
Spoke with pt. Advised him that we contacted him to let him know that we placed the order for his CPAP supplies. Nothing further was needed.

## 2017-11-29 ENCOUNTER — Telehealth: Payer: Self-pay | Admitting: Pulmonary Disease

## 2017-11-29 NOTE — Telephone Encounter (Signed)
OK 

## 2017-11-29 NOTE — Telephone Encounter (Signed)
Will route this message to Dr. Elsworth Soho as an Juluis Rainier.

## 2017-12-02 ENCOUNTER — Ambulatory Visit (INDEPENDENT_AMBULATORY_CARE_PROVIDER_SITE_OTHER): Payer: Medicare Other | Admitting: Pharmacist

## 2017-12-02 DIAGNOSIS — Z7901 Long term (current) use of anticoagulants: Secondary | ICD-10-CM

## 2017-12-02 DIAGNOSIS — I482 Chronic atrial fibrillation: Secondary | ICD-10-CM | POA: Diagnosis not present

## 2017-12-02 DIAGNOSIS — I2699 Other pulmonary embolism without acute cor pulmonale: Secondary | ICD-10-CM

## 2017-12-02 DIAGNOSIS — I4821 Permanent atrial fibrillation: Secondary | ICD-10-CM

## 2017-12-02 LAB — POCT INR: INR: 3.4

## 2017-12-02 NOTE — Telephone Encounter (Signed)
Will close this encounter since RA is aware.

## 2017-12-06 ENCOUNTER — Telehealth: Payer: Self-pay | Admitting: Pulmonary Disease

## 2017-12-06 NOTE — Telephone Encounter (Signed)
Camp[facility] noted. Dr. Mitzie Na.   Spoke with wife advised her that we received the message and I would forward this to Dr. Elsworth Soho.

## 2017-12-06 NOTE — Telephone Encounter (Signed)
ok 

## 2017-12-09 ENCOUNTER — Ambulatory Visit (INDEPENDENT_AMBULATORY_CARE_PROVIDER_SITE_OTHER): Payer: Medicare Other | Admitting: Pharmacist Clinician (PhC)/ Clinical Pharmacy Specialist

## 2017-12-09 DIAGNOSIS — I4821 Permanent atrial fibrillation: Secondary | ICD-10-CM

## 2017-12-09 DIAGNOSIS — I482 Chronic atrial fibrillation, unspecified: Secondary | ICD-10-CM

## 2017-12-09 DIAGNOSIS — Z7901 Long term (current) use of anticoagulants: Secondary | ICD-10-CM | POA: Diagnosis not present

## 2017-12-09 DIAGNOSIS — I2699 Other pulmonary embolism without acute cor pulmonale: Secondary | ICD-10-CM

## 2017-12-09 LAB — POCT INR: INR: 2.5

## 2017-12-23 ENCOUNTER — Ambulatory Visit (INDEPENDENT_AMBULATORY_CARE_PROVIDER_SITE_OTHER): Payer: Medicare Other | Admitting: Pharmacist

## 2017-12-23 DIAGNOSIS — I4821 Permanent atrial fibrillation: Secondary | ICD-10-CM

## 2017-12-23 DIAGNOSIS — I2699 Other pulmonary embolism without acute cor pulmonale: Secondary | ICD-10-CM

## 2017-12-23 DIAGNOSIS — Z952 Presence of prosthetic heart valve: Secondary | ICD-10-CM | POA: Diagnosis not present

## 2017-12-23 DIAGNOSIS — Z7901 Long term (current) use of anticoagulants: Secondary | ICD-10-CM

## 2017-12-23 LAB — POCT INR: INR: 2.9 (ref 2.0–3.0)

## 2017-12-24 DIAGNOSIS — G4733 Obstructive sleep apnea (adult) (pediatric): Secondary | ICD-10-CM | POA: Diagnosis not present

## 2017-12-25 ENCOUNTER — Telehealth: Payer: Self-pay | Admitting: Pulmonary Disease

## 2017-12-25 DIAGNOSIS — G4733 Obstructive sleep apnea (adult) (pediatric): Secondary | ICD-10-CM | POA: Diagnosis not present

## 2017-12-25 NOTE — Telephone Encounter (Signed)
Per RA, HST showed moderate OSA with 27 events per hour. Persists even with recent weight loss. Recommends staying on cpap machine.

## 2017-12-26 ENCOUNTER — Other Ambulatory Visit: Payer: Self-pay | Admitting: *Deleted

## 2017-12-26 DIAGNOSIS — G4733 Obstructive sleep apnea (adult) (pediatric): Secondary | ICD-10-CM

## 2017-12-31 NOTE — Telephone Encounter (Signed)
Spoke with patient's wife. She is aware of RA's recs. She will let the patient know. Advised her to tell the patient to call us if he has any questions.   Nothing else needed at time of call.

## 2018-01-01 LAB — CUP PACEART INCLINIC DEVICE CHECK
Implantable Lead Implant Date: 20100720
Implantable Lead Location: 753860
Implantable Lead Model: 1948
Implantable Pulse Generator Implant Date: 20100720
Lead Channel Setting Pacing Amplitude: 2.5 V
Lead Channel Setting Pacing Pulse Width: 0.4 ms
Lead Channel Setting Sensing Sensitivity: 2 mV
MDC IDC SESS DTM: 20190612170556
Pulse Gen Model: 5626
Pulse Gen Serial Number: 7043529

## 2018-01-06 DIAGNOSIS — Z125 Encounter for screening for malignant neoplasm of prostate: Secondary | ICD-10-CM | POA: Diagnosis not present

## 2018-01-06 DIAGNOSIS — E785 Hyperlipidemia, unspecified: Secondary | ICD-10-CM | POA: Diagnosis not present

## 2018-01-06 DIAGNOSIS — I4891 Unspecified atrial fibrillation: Secondary | ICD-10-CM | POA: Diagnosis not present

## 2018-01-13 ENCOUNTER — Telehealth (INDEPENDENT_AMBULATORY_CARE_PROVIDER_SITE_OTHER): Payer: Self-pay | Admitting: Orthopedic Surgery

## 2018-01-13 DIAGNOSIS — E785 Hyperlipidemia, unspecified: Secondary | ICD-10-CM | POA: Diagnosis not present

## 2018-01-13 DIAGNOSIS — Z23 Encounter for immunization: Secondary | ICD-10-CM | POA: Diagnosis not present

## 2018-01-13 DIAGNOSIS — I4891 Unspecified atrial fibrillation: Secondary | ICD-10-CM | POA: Diagnosis not present

## 2018-01-13 NOTE — Telephone Encounter (Signed)
Called pt to advise order written for 30/40 knee high compression and this will be faxed to [facility] in [place]

## 2018-01-13 NOTE — Telephone Encounter (Signed)
Pt needs prescription for xxl compression socks of Dr. Gavin Potters sent to [facility] Fax # [phone]  Patient said it has been a while since he has seen Dr. Sharol Given but when it was [facility] he could just walk in and get them. If any questions please call patient  # [phone]

## 2018-01-24 ENCOUNTER — Ambulatory Visit (INDEPENDENT_AMBULATORY_CARE_PROVIDER_SITE_OTHER): Payer: Medicare Other | Admitting: Pharmacist

## 2018-01-24 DIAGNOSIS — I2699 Other pulmonary embolism without acute cor pulmonale: Secondary | ICD-10-CM

## 2018-01-24 DIAGNOSIS — I4821 Permanent atrial fibrillation: Secondary | ICD-10-CM

## 2018-01-24 DIAGNOSIS — Z7901 Long term (current) use of anticoagulants: Secondary | ICD-10-CM

## 2018-01-24 LAB — POCT INR: INR: 2.4 (ref 2.0–3.0)

## 2018-01-31 ENCOUNTER — Other Ambulatory Visit: Payer: Self-pay

## 2018-01-31 ENCOUNTER — Ambulatory Visit
Admission: RE | Admit: 2018-01-31 | Discharge: 2018-01-31 | Disposition: A | Discharge status: Home / Self Care | Payer: Self-pay | Source: Ambulatory Visit | Attending: Pulmonary Disease | Admitting: Pulmonary Disease

## 2018-01-31 DIAGNOSIS — L821 Other seborrheic keratosis: Secondary | ICD-10-CM | POA: Diagnosis not present

## 2018-01-31 DIAGNOSIS — I2699 Other pulmonary embolism without acute cor pulmonale: Secondary | ICD-10-CM

## 2018-01-31 DIAGNOSIS — L814 Other melanin hyperpigmentation: Secondary | ICD-10-CM | POA: Diagnosis not present

## 2018-01-31 DIAGNOSIS — D225 Melanocytic nevi of trunk: Secondary | ICD-10-CM | POA: Diagnosis not present

## 2018-01-31 DIAGNOSIS — D2261 Melanocytic nevi of right upper limb, including shoulder: Secondary | ICD-10-CM | POA: Diagnosis not present

## 2018-01-31 DIAGNOSIS — L218 Other seborrheic dermatitis: Secondary | ICD-10-CM | POA: Diagnosis not present

## 2018-01-31 DIAGNOSIS — D2262 Melanocytic nevi of left upper limb, including shoulder: Secondary | ICD-10-CM | POA: Diagnosis not present

## 2018-01-31 DIAGNOSIS — L723 Sebaceous cyst: Secondary | ICD-10-CM | POA: Diagnosis not present

## 2018-01-31 DIAGNOSIS — B372 Candidiasis of skin and nail: Secondary | ICD-10-CM | POA: Diagnosis not present

## 2018-02-04 DIAGNOSIS — Z79891 Long term (current) use of opiate analgesic: Secondary | ICD-10-CM | POA: Diagnosis not present

## 2018-02-04 DIAGNOSIS — Z79899 Other long term (current) drug therapy: Secondary | ICD-10-CM | POA: Diagnosis not present

## 2018-02-04 DIAGNOSIS — G894 Chronic pain syndrome: Secondary | ICD-10-CM | POA: Diagnosis not present

## 2018-02-04 DIAGNOSIS — M889 Osteitis deformans of unspecified bone: Secondary | ICD-10-CM | POA: Diagnosis not present

## 2018-02-04 DIAGNOSIS — M47816 Spondylosis without myelopathy or radiculopathy, lumbar region: Secondary | ICD-10-CM | POA: Diagnosis not present

## 2018-02-07 ENCOUNTER — Ambulatory Visit (INDEPENDENT_AMBULATORY_CARE_PROVIDER_SITE_OTHER): Payer: Medicare Other | Admitting: Pharmacist

## 2018-02-07 DIAGNOSIS — Z7901 Long term (current) use of anticoagulants: Secondary | ICD-10-CM | POA: Diagnosis not present

## 2018-02-07 DIAGNOSIS — I2699 Other pulmonary embolism without acute cor pulmonale: Secondary | ICD-10-CM

## 2018-02-07 DIAGNOSIS — I4821 Permanent atrial fibrillation: Secondary | ICD-10-CM

## 2018-02-07 LAB — POCT INR: INR: 3 (ref 2.0–3.0)

## 2018-02-17 ENCOUNTER — Ambulatory Visit (INDEPENDENT_AMBULATORY_CARE_PROVIDER_SITE_OTHER): Payer: Medicare Other

## 2018-02-17 ENCOUNTER — Encounter (INDEPENDENT_AMBULATORY_CARE_PROVIDER_SITE_OTHER): Payer: Self-pay | Admitting: Orthopedic Surgery

## 2018-02-17 ENCOUNTER — Ambulatory Visit (INDEPENDENT_AMBULATORY_CARE_PROVIDER_SITE_OTHER): Payer: Medicare Other | Admitting: Orthopedic Surgery

## 2018-02-17 VITALS — Ht 75.0 in | Wt 275.0 lb

## 2018-02-17 DIAGNOSIS — M21372 Foot drop, left foot: Secondary | ICD-10-CM | POA: Diagnosis not present

## 2018-02-17 DIAGNOSIS — G8929 Other chronic pain: Secondary | ICD-10-CM

## 2018-02-17 DIAGNOSIS — M545 Low back pain: Secondary | ICD-10-CM

## 2018-02-17 DIAGNOSIS — M21371 Foot drop, right foot: Secondary | ICD-10-CM | POA: Diagnosis not present

## 2018-02-17 NOTE — Progress Notes (Signed)
Office Visit Note   Patient: Cameron Sharp           Date of Birth: 1936/07/18           MRN: [id number] Visit Date: 02/17/2018              Requested by: [name], [address] [place], [place] [address] PCP: [name], MD  Chief Complaint  Patient presents with  . Lower Back - Pain      HPI: Patient is a 82 year old gentleman who presents stating that his back is crooked he states he stopped doing his therapy due to worsening balance.  Patient has had a series of epidural steroid injections and is scheduled for repeat injections in September.  Patient has been on Coumadin and has been taken off for 7 surgeries to remove a soft tissue cancer.  He states that when he was off the Coumadin he did not did develop a pulmonary embolus.  Patient denies any radicular pain he states his pain is primarily with walking and standing.  Assessment & Plan: Visit Diagnoses:  1. Chronic midline low back pain without sciatica   2. Foot drop, bilateral     Plan: Recommend patient follow-up for his epidural steroid injections recommend that he get back to the gym for core strengthening balance and lower extremity strengthening.  Offered him an ankle-foot orthosis for his foot drop bilaterally and patient states that he does not want to use a brace at this time he states he is just careful with ambulation.  Follow-Up Instructions: Return if symptoms worsen or fail to improve.   Ortho Exam  Patient is alert, oriented, no adenopathy, well-dressed, normal affect, normal respiratory effort. Examination patient has difficulty getting from a sitting to a standing position he has a kyphosis and scoliosis of his spine.  Patient uses a cane for ambulation.  Examination he has good hip flexion which is symmetric bilaterally.  He has foot drop on both feet with strength less than 2/5 bilaterally he has plantar flexion strength of 4/5 bilaterally.  Patient denies any focal motor  changes.  Imaging: Xr Lumbar Spine 2-3 Views  Result Date: 02/17/2018 2 view radiographs of the lumbar spine shows a fixed scoliosis of the thoracolumbar junction.  Patient also has extensive degenerative disc disease with osteophytic bone spurs with complete collapse of the disc space.  Patient also has calcification of the aorta with no signs of aneurysm.  No images are attached to the encounter.  Labs: No results found for: HGBA1C, ESRSEDRATE, CRP, LABURIC, REPTSTATUS, GRAMSTAIN, CULT, LABORGA   Lab Results  Component Value Date   ALBUMIN 3.2 (L) 11/08/2017   ALBUMIN 4.2 10/30/2016   ALBUMIN 4.7 10/25/2014    Body mass index is 34.37 kg/m.  Orders:  Orders Placed This Encounter  Procedures  . XR Lumbar Spine 2-3 Views   No orders of the defined types were placed in this encounter.    Procedures: No procedures performed  Clinical Data: No additional findings.  ROS:  All other systems negative, except as noted in the HPI. Review of Systems  Objective: Vital Signs: Ht 6\' 3"  (1.905 m)   Wt 275 lb (124.7 kg)   BMI 34.37 kg/m   Specialty Comments:  No specialty comments available.  PMFS History: Patient Active Problem List   Diagnosis Date Noted  . Hemoptysis 11/07/2017  . Pulmonary embolism ([place]) 11/07/2017  . Paget disease of bone   . Atrial fibrillation, chronic ([place])   .  Paget disease, extra mammary 05/30/2017  . Varicose veins of leg with pain 06/07/2015  . Venous insufficiency of right leg 05/07/2015  . Morbid obesity ([place]) 11/13/2014  . Degenerative disc disease 11/29/2013  . Cellulitis of left leg 07/24/2013  . Pacemaker -[place] 2010 05/01/2013  . Hyperlipidemia 05/01/2013  . OSA on CPAP 05/01/2013  . HTN (hypertension) 05/01/2013  . Mild obesity 05/01/2013  . Diarrhea 05/01/2013  . Permanent atrial fibrillation ([place]) 10/10/2012  . Long term current use of anticoagulant therapy 10/10/2012   Past Medical History:   Diagnosis Date  . Arthritis    back and neck  . Atrial fibrillation ([place]) 10/10/2012  . Chronic back pain   . Gout   . HTN (hypertension) 05/01/2013  . Hyperlipidemia   . Hypertension   . OSA on CPAP    wears CPAP nightly  . Pacemaker   . Pacemaker -[place] 2010 05/01/2013  . Paget disease, extramammary    left groin  . Permanent atrial fibrillation ([facility])     Family History  Problem Relation Age of Onset  . Stomach cancer Father   . Lung cancer Maternal Grandfather   . Breast cancer Paternal Grandmother   . Prostate cancer Paternal Grandfather     Past Surgical History:  Procedure Laterality Date  . carpel tunnel surgery Right 2012  . CHOLECYSTECTOMY    . COLONOSCOPY    . CYST EXCISION Right 05/08/2017   Procedure: REEXCISION OF EXTRA MAMARY PAGETS DISEASE OF RIGHT GROIN;  Surgeon: [name], DO;  Location: [facility];  Service: Plastics;  Laterality: Right;  . EXCISION MASS LOWER EXTREMETIES Left 09/26/2017   Procedure: Excision of left groin extra mammary Paget's disease;  Surgeon: [name], DO;  Location: [facility];  Service: Plastics;  Laterality: Left;  . EXCISION MASS LOWER EXTREMETIES Left 10/22/2017   Procedure: RE-EXCISION OF LEFT GROIN EXTRA MAMMARY PAGET'S DISEASE;  Surgeon: [name], DO;  Location: [facility];  Service: Plastics;  Laterality: Left;  . GALLBLADDER SURGERY  12/2006  . LESION EXCISION WITH COMPLEX REPAIR Bilateral 07/31/2017   Procedure: BIOPSY OF BILATERAL GROIN  CHANGING SKIN LESIONS;  Surgeon: [name], DO;  Location: [facility];  Service: Plastics;  Laterality: Bilateral;  . LIPOMA EXCISION N/A 11/01/2016   Procedure: EXCISION OF SEBACEOUS CYST ON BACK;  Surgeon: [name], DO;  Location: [facility];  Service: Plastics;  Laterality: N/A;  . MASS EXCISION N/A 11/01/2016   Procedure: EXCISION OF RIGHT  GROIN SKIN LESION;  Surgeon: [name], DO;  Location: [facility];  Service: Plastics;  Laterality: N/A;  . MASS EXCISION Right 11/15/2016   Procedure: EXCISION POSITIVE MARGIN RIGHT GROIN;  Surgeon: [name], DO;  Location: [facility];  Service: Plastics;  Laterality: Right;  . MASS EXCISION Right 04/04/2017   Procedure: REEXCISION OF EXTRA MAMARY PAGETS SKIN DISEASE OF RIGHT GROIN;  Surgeon: [name], DO;  Location: [facility];  Service: Plastics;  Laterality: Right;  . NM MYOCAR PERF WALL MOTION  05/22/07   no significant ischemia  . PERMANENT PACEMAKER INSERTION  01/09/2009   [facility]  . TONSILLECTOMY     age 31  . US ECHOCARDIOGRAPHY  11/24/07   mild LVH,LA & RA mod to severely dilated,mild mitral annular ca+,mild TR,mild Pulmonary hypertensio,AOV mod. sclerotic,mild AI w/root dilatation and ca+.   Social History  Occupational History  . Not on file  Tobacco Use  . Smoking status: Former Smoker    Packs/day: 3.00    Years: 13.00    Pack years: 39.00    Last attempt to quit: 07/23/1976    Years since quitting: 41.6  . Smokeless tobacco: Never Used  Substance and Sexual Activity  . Alcohol use: Yes    Alcohol/week: 0.0 oz    Comment: 2oz scotch daily  . Drug use: No  . Sexual activity: Not on file

## 2018-02-25 ENCOUNTER — Telehealth: Payer: Self-pay | Admitting: Cardiovascular Disease

## 2018-02-25 NOTE — Telephone Encounter (Signed)
Advised pharmacy generic okay, please use same manufacturer with each fill

## 2018-02-25 NOTE — Telephone Encounter (Signed)
Pt's mail order pharmacy calling requesting to refill pt's coumadin 5 mg tablet as a generic. Pharmacy would like a call back [phone], reference # [id number]. Please address

## 2018-02-27 DIAGNOSIS — L218 Other seborrheic dermatitis: Secondary | ICD-10-CM | POA: Diagnosis not present

## 2018-02-27 DIAGNOSIS — C44599 Other specified malignant neoplasm of skin of other part of trunk: Secondary | ICD-10-CM | POA: Diagnosis not present

## 2018-03-12 ENCOUNTER — Ambulatory Visit (INDEPENDENT_AMBULATORY_CARE_PROVIDER_SITE_OTHER): Payer: Medicare Other | Admitting: Pharmacist Clinician (PhC)/ Clinical Pharmacy Specialist

## 2018-03-12 DIAGNOSIS — I482 Chronic atrial fibrillation, unspecified: Secondary | ICD-10-CM

## 2018-03-12 DIAGNOSIS — Z7901 Long term (current) use of anticoagulants: Secondary | ICD-10-CM

## 2018-03-12 DIAGNOSIS — I4821 Permanent atrial fibrillation: Secondary | ICD-10-CM

## 2018-03-12 DIAGNOSIS — I2699 Other pulmonary embolism without acute cor pulmonale: Secondary | ICD-10-CM

## 2018-03-12 LAB — POCT INR: INR: 2.2 (ref 2.0–3.0)

## 2018-03-31 DIAGNOSIS — G894 Chronic pain syndrome: Secondary | ICD-10-CM | POA: Diagnosis not present

## 2018-03-31 DIAGNOSIS — M889 Osteitis deformans of unspecified bone: Secondary | ICD-10-CM | POA: Diagnosis not present

## 2018-03-31 DIAGNOSIS — M47816 Spondylosis without myelopathy or radiculopathy, lumbar region: Secondary | ICD-10-CM | POA: Diagnosis not present

## 2018-03-31 DIAGNOSIS — M47817 Spondylosis without myelopathy or radiculopathy, lumbosacral region: Secondary | ICD-10-CM | POA: Diagnosis not present

## 2018-04-04 ENCOUNTER — Ambulatory Visit (INDEPENDENT_AMBULATORY_CARE_PROVIDER_SITE_OTHER): Payer: Medicare Other | Admitting: Pharmacist

## 2018-04-04 DIAGNOSIS — Z7901 Long term (current) use of anticoagulants: Secondary | ICD-10-CM

## 2018-04-04 DIAGNOSIS — I2699 Other pulmonary embolism without acute cor pulmonale: Secondary | ICD-10-CM | POA: Diagnosis not present

## 2018-04-04 DIAGNOSIS — I482 Chronic atrial fibrillation: Secondary | ICD-10-CM

## 2018-04-04 DIAGNOSIS — I4821 Permanent atrial fibrillation: Secondary | ICD-10-CM

## 2018-04-04 DIAGNOSIS — Z952 Presence of prosthetic heart valve: Secondary | ICD-10-CM | POA: Diagnosis not present

## 2018-04-04 LAB — POCT INR: INR: 3 (ref 2.0–3.0)

## 2018-04-10 ENCOUNTER — Telehealth: Payer: Self-pay | Admitting: Cardiovascular Disease

## 2018-04-10 MED ORDER — VALSARTAN-HYDROCHLOROTHIAZIDE 160-12.5 MG PO TABS: 1.0000 | ORAL_TABLET | Freq: Every day | ORAL | 5 refills | Status: DC

## 2018-04-10 NOTE — Telephone Encounter (Signed)
New message   *STAT* If patient is at the pharmacy, call can be transferred to refill team.   1. Which medications need to be refilled? (please list name of each medication and dose if known) valsartan-hydrochlorothiazide (DIOVAN-HCT) 160-12.5 MG tablet  2. Which pharmacy/location (including street and city if local pharmacy) is medication to be sent to?[facility] [address]   3. Do they need a 30 day or 90 day supply?30   Patient states that he needs this filled either today or tomorrow.

## 2018-04-16 ENCOUNTER — Encounter: Payer: Medicare Other | Admitting: Cardiovascular Disease

## 2018-04-22 ENCOUNTER — Ambulatory Visit (INDEPENDENT_AMBULATORY_CARE_PROVIDER_SITE_OTHER): Payer: Medicare Other | Admitting: Cardiovascular Disease

## 2018-04-22 ENCOUNTER — Encounter: Payer: Self-pay | Admitting: Cardiovascular Disease

## 2018-04-22 VITALS — BP 114/50 | HR 65 | Ht 75.0 in | Wt 282.0 lb

## 2018-04-22 DIAGNOSIS — I2699 Other pulmonary embolism without acute cor pulmonale: Secondary | ICD-10-CM

## 2018-04-22 DIAGNOSIS — M549 Dorsalgia, unspecified: Secondary | ICD-10-CM | POA: Diagnosis not present

## 2018-04-22 DIAGNOSIS — I4821 Permanent atrial fibrillation: Secondary | ICD-10-CM

## 2018-04-22 DIAGNOSIS — E669 Obesity, unspecified: Secondary | ICD-10-CM

## 2018-04-22 DIAGNOSIS — Z7901 Long term (current) use of anticoagulants: Secondary | ICD-10-CM

## 2018-04-22 DIAGNOSIS — G4733 Obstructive sleep apnea (adult) (pediatric): Secondary | ICD-10-CM

## 2018-04-22 DIAGNOSIS — C4499 Other specified malignant neoplasm of skin, unspecified: Secondary | ICD-10-CM | POA: Diagnosis not present

## 2018-04-22 DIAGNOSIS — I10 Essential (primary) hypertension: Secondary | ICD-10-CM | POA: Diagnosis not present

## 2018-04-22 NOTE — Progress Notes (Signed)
Patient ID: Cameron Sharp, male   DOB: 25-Oct-1935, 82 y.o.   MRN: [id number]    Primary M.D.: Dr. [name] Pacemaker M.D.: Dr. Sallyanne Kuster  HPI: Cameron Sharp is a 82 y.o. male who presents to the office today for an 12 month follow up cardiology evaluation.  Cameron Sharp has a history of permanent atrial fibrillation and is on chronic Coumadin therapy.  He does home testing of his Coumadin.  He also has a history of hypertension, hyperlipidemia, obstructive sleep apnea on CPAP therapy, and obesity.  In July 2010 he underwent insertion of a [employer] Zephyr single-chamber pacemaker.  He sees Dr. Sallyanne Kuster for pacemaker followup evaluation.  Cameron. Sharp has had difficulty with weight fluctuation with weights ranging from 344 pounds to 271 pounds.  In April 2013 his weight had reduced to 271 pounds.  Since that time, his weight has gradually increased to approximately 335 pounds.  He has had difficulty with lower back degenerative disc disease, which has limited some of his previous ambulation.    He has obstructive sleep apnea.  His CPAP unit is set at 14 cm water pressure.  He admits to 100% compliance with CPAP therapy.  His sleep duration is typically greater than 8 hours per night.  He never sleeps without it.  He denies breakthrough snoring.  His sleep is restorative.  He denies excessive daytime sleepiness.  There is no bruxism.  There is no restless legs.    A seven-year follow-up echo Doppler study on 10/11/2014 showed an ejection fraction at 55-60%.  There was moderate aortic sclerosis without stenosis with mild aortic insufficiency.  There was mild mitral regurgitation.  There was biatrial enlargement, left greater than right.  There was trivial pulmonic insufficiency.  Pulmonary pressures were normal.  When I saw him last year he had spent most of the summer in [place].  However, he developed a swollen right leg and aduplex exam in [place] which did not reveal DVT. He came back to [place].   He has been evaluated by Dr. Oneida Alar and Dr. Donnetta Hutching.  There may had been initial plans for laser therapy.  Ultimately, a conservative approach with Dr. Jess Barters silver compression socks was recommended.   He continues to use CPAP and [employer] is his DME company.  Continues to have difficulty with his degenerative disease of his low back, limiting his ambulation.  He continues with home monitoring of his INR and denies recent bleeding.  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   I last saw him in September 2018, he has had issues with his back due to congenital disc disease.  Atrial fibrillation rate has been well controlled.  He saw Dr. Sallyanne Kuster in March 2019 and pacemaker interrogation again showed normal device function with 71% ventricular pacing and without episodes of high ventricular rates.  Estimated generator longevity was 5.25 to 6.5 years.  Lead parameters were normal.  He continues to monitor his Coumadin at home.  He continues to use CPAP for his obstructive sleep apnea and recently was changed to a ResMed F 30 mask which he feels is significantly improved from his previous full facemask leading to pressure sores on his bridge of his nose.  He denies chest pain or palpitations.  His walking has been limited.  He presents for  evaluation  Past Medical History:  Diagnosis Date  . Arthritis    back and neck  . Atrial fibrillation ([place]) 10/10/2012  . Chronic back pain   . Gout   . HTN (hypertension) 05/01/2013  . Hyperlipidemia   . Hypertension   . OSA on CPAP    wears CPAP nightly  . Pacemaker   . Pacemaker -[place] 2010 05/01/2013  . Paget disease, extramammary    left groin  . Permanent atrial fibrillation     Past Surgical History:  Procedure Laterality Date    . carpel tunnel surgery Right 2012  . CHOLECYSTECTOMY    . COLONOSCOPY    . CYST EXCISION Right 05/08/2017   Procedure: REEXCISION OF EXTRA MAMARY PAGETS DISEASE OF RIGHT GROIN;  Surgeon: [name], DO;  Location: [place];  Service: Plastics;  Laterality: Right;  . EXCISION MASS LOWER EXTREMETIES Left 09/26/2017   Procedure: Excision of left groin extra mammary Paget's disease;  Surgeon: [name], DO;  Location: [place];  Service: Plastics;  Laterality: Left;  . EXCISION MASS LOWER EXTREMETIES Left 10/22/2017   Procedure: RE-EXCISION OF LEFT GROIN EXTRA MAMMARY PAGET'S DISEASE;  Surgeon: [name], DO;  Location: [place];  Service: Plastics;  Laterality: Left;  . GALLBLADDER SURGERY  12/2006  . LESION EXCISION WITH COMPLEX REPAIR Bilateral 07/31/2017   Procedure: BIOPSY OF BILATERAL GROIN  CHANGING SKIN LESIONS;  Surgeon: [name], DO;  Location: [place];  Service: Plastics;  Laterality: Bilateral;  . LIPOMA EXCISION N/A 11/01/2016   Procedure: EXCISION OF SEBACEOUS CYST ON BACK;  Surgeon: [name], DO;  Location: [place];  Service: Plastics;  Laterality: N/A;  . MASS EXCISION N/A 11/01/2016   Procedure: EXCISION OF RIGHT GROIN SKIN LESION;  Surgeon: [name], DO;  Location: [place];  Service: Plastics;  Laterality: N/A;  . MASS EXCISION Right 11/15/2016   Procedure: EXCISION POSITIVE MARGIN RIGHT GROIN;  Surgeon: [name], DO;  Location: [place];  Service: Plastics;  Laterality: Right;  . MASS EXCISION Right 04/04/2017   Procedure: REEXCISION OF EXTRA MAMARY PAGETS SKIN DISEASE OF RIGHT GROIN;  Surgeon: [name], DO;  Location: [place];  Service: Plastics;  Laterality: Right;  . NM MYOCAR PERF WALL MOTION  05/22/07   no significant ischemia  . PERMANENT PACEMAKER  INSERTION  01/09/2009   [employer]  . TONSILLECTOMY     age 50  . US ECHOCARDIOGRAPHY  11/24/07   mild LVH,LA & RA mod to severely dilated,mild mitral annular ca+,mild TR,mild Pulmonary hypertensio,AOV mod. sclerotic,mild AI w/root dilatation and ca+.    No Known Allergies  Current Outpatient Medications  Medication Sig Dispense Refill  . atorvastatin (LIPITOR) 20 MG tablet TAKE 1 TABLET DAILY 90 tablet 3  . baclofen (LIORESAL) 10 MG tablet Take 1 tablet by mouth 2 (two) times daily.  0  . gabapentin (NEURONTIN) 300 MG capsule Take by mouth 3 (three) times daily. PATIENT TAKES '600MG'$  IN AM, '300MG'$  AT MID DAY, 600 AT BEDTIME    . HYDROcodone-acetaminophen (NORCO) 5-325 MG tablet Take 1 tablet by mouth every 6 (six) hours as needed for moderate pain. 20 tablet 0  . metoprolol tartrate (LOPRESSOR) 25 MG tablet Take 0.5 tablets (12.5 mg total) by mouth every morning. 45 tablet 3  . Thiamine HCl (VITAMIN B-1) 250 MG tablet Take 250 mg by  mouth daily.    Marland Kitchen ULORIC 40 MG tablet Take 1 tablet (40 mg total) by mouth daily. 30 tablet 6  . valsartan-hydrochlorothiazide (DIOVAN-HCT) 160-12.5 MG tablet Take 1 tablet by mouth daily. 30 tablet 5  . apixaban (ELIQUIS) 5 MG TABS tablet Take 1 tablet (5 mg total) by mouth 2 (two) times daily. 60 tablet 5   No current facility-administered medications for this visit.     Social History   Socioeconomic History  . Marital status: Married    Spouse name: Not on file  . Number of children: Not on file  . Years of education: Not on file  . Highest education level: Not on file  Occupational History  . Not on file  Social Needs  . Financial resource strain: Not on file  . Food insecurity:    Worry: Not on file    Inability: Not on file  . Transportation needs:    Medical: Not on file    Non-medical: Not on file  Tobacco Use  . Smoking status: Former Smoker    Packs/day: 3.00    Years: 13.00    Pack years: 39.00    Last attempt to quit: 07/23/1976     Years since quitting: 41.7  . Smokeless tobacco: Never Used  Substance and Sexual Activity  . Alcohol use: Yes    Alcohol/week: 0.0 standard drinks    Comment: 2oz scotch daily  . Drug use: No  . Sexual activity: Not on file  Lifestyle  . Physical activity:    Days per week: Not on file    Minutes per session: Not on file  . Stress: Not on file  Relationships  . Social connections:    Talks on phone: Not on file    Gets together: Not on file    Attends religious service: Not on file    Active member of club or organization: Not on file    Attends meetings of clubs or organizations: Not on file    Relationship status: Not on file  . Intimate partner violence:    Fear of current or ex partner: Not on file    Emotionally abused: Not on file    Physically abused: Not on file    Forced sexual activity: Not on file  Other Topics Concern  . Not on file  Social History Narrative  . Not on file    Family History  Problem Relation Age of Onset  . Stomach cancer Father   . Lung cancer Maternal Grandfather   . Breast cancer Paternal Grandmother   . Prostate cancer Paternal Grandfather     ROS General: Positive for weight gain No fevers, chills, or night sweats; Positive for morbid obesity. HEENT: Negative; No changes in vision or hearing, sinus congestion, difficulty swallowing Pulmonary: Negative; No cough, wheezing, shortness of breath, hemoptysis Cardiovascular: Negative; No chest pain, presyncope, syncope, palpatations Positive for mild edema;  Venous insufficiency GI: Negative; No nausea, vomiting, diarrhea, or abdominal pain GU: Negative; No dysuria, hematuria, or difficulty voiding Musculoskeletal: Positive for low back discomfort due to degenerative disc disease;  no myalgias, joint pain, or weakness Hematologic: Negative; no easy bruising, bleeding Endocrine: Negative; no heat/cold intolerance; no diabetes, Neuro: Positive for degenerative disc disease; no changes in  balance, headaches Skin: Open surgical procedures for excision of extramammary Paget's disease Psychiatric: Negative; No behavioral problems, depression Sleep: Positive for obstructive sleep apnea with CPAP therapy and 100% compliance.  No snoring,  daytime sleepiness, hypersomnolence, bruxism, restless legs,  hypnogognic hallucinations. Other comprehensive 14 point system review is negative    Physical Exam BP (!) 114/50   Pulse 65   Ht _0  (1.905 m)   Wt 282 lb (127.9 kg)   SpO2 93%   BMI 35.25 kg/m    Repeat blood pressure by me was 120/62  Wt Readings from Last 3 Encounters:  04/24/18 284 lb (128.8 kg)  04/22/18 282 lb (127.9 kg)  02/17/18 275 lb (124.7 kg)    General: Alert, oriented, no distress.  Skin: normal turgor, no rashes, warm and dry HEENT: Normocephalic, atraumatic. Pupils equal round and reactive to light; sclera anicteric; extraocular muscles intact;  Nose without nasal septal hypertrophy Mouth/Parynx benign; Mallinpatti scale 3 Neck: No JVD, no carotid bruits; normal carotid upstroke Lungs: clear to ausculatation and percussion; no wheezing or rales Chest wall: without tenderness to palpitation Heart: PMI not displaced, really irregular consistent with atrial fibrillation with controlled rate, s1 s2 normal, 1/6 systolic murmur, no diastolic murmur, no rubs, gallops, thrills, or heaves Abdomen: soft, nontender; no hepatosplenomehaly, BS+; abdominal aorta nontender and not dilated by palpation. Back: no CVA tenderness Pulses 2+ Musculoskeletal: full range of motion, normal strength, no joint deformities Extremities: He is now wearing compression Vive socks which has controlled his previous edema.  No clubbing cyanosis or edema, Homan's sign negative  Neurologic: grossly nonfocal; Cranial nerves grossly wnl Psychologic: Normal mood and affect   ECG (independently read by me): Atrial fibrillation with frequent ventricular paced beats.  Baseline inferolateral  T wave changes.  September 2018 ECG (independently read by me): Baseline atrial fibrillation;  Frequent ventricular paced rhythm  T-wave abnormality  September 2017 ECG (independently read by me): Ventricular paced rhythm at 60 bpm.   October 2016 ECG (independently read by me):  Underlying atrial fibrillation with appropriate V pacing  April 2016 ECG (independently read by me): Atrial fibrillation with appropriate V pacing and sensing.  January 2016 ECG (independently read by me): Atrial fibrillation at 67 bpm with nonspecific T changes.  Appropriate V pacing and sensing.  Prior ECG (independently read by me): Underlying atrial fibrillation with appropriate V. pacing and V. sensing.  Ventricular rate 65  LABS:  BMP Latest Ref Rng & Units 11/09/2017 11/08/2017 11/07/2017  Glucose 65 - 99 mg/dL 93 112(H) -  BUN 6 - 20 mg/dL 17 20 -  Creatinine 0.61 - 1.24 mg/dL 0.86 0.91 1.00  Sodium 135 - 145 mmol/L 141 140 -  Potassium 3.5 - 5.1 mmol/L 4.0 4.1 -  Chloride 101 - 111 mmol/L 107 108 -  CO2 22 - 32 mmol/L 23 24 -  Calcium 8.9 - 10.3 mg/dL 9.0 8.8(L) -     Hepatic Function Latest Ref Rng & Units 11/08/2017 10/30/2016 10/25/2014  Total Protein 6.5 - 8.1 g/dL 6.9 6.7 7.2  Albumin 3.5 - 5.0 g/dL 3.2(L) 4.2 4.7  AST 15 - 41 U/L _1 ALT 17 - 63 U/L 14(L) 14(L) 14  Alk Phosphatase 38 - 126 U/L 57 49 41  Total Bilirubin 0.3 - 1.2 mg/dL 0.8 1.0 1.4(H)    CBC Latest Ref Rng & Units 11/09/2017 11/08/2017 11/07/2017  WBC 4.0 - 10.5 K/uL 6.7 7.8 7.9  Hemoglobin 13.0 - 17.0 g/dL 13.5 13.1 13.4  Hematocrit 39.0 - 52.0 % 40.3 39.7 40.1  Platelets 150 - 400 K/uL 262 263 256   No results found for: TSH  BNP No results found for: PROBNP  Lipid Panel     Component Value Date/Time  CHOL 173 10/25/2014 0838   TRIG 129 10/25/2014 0838   HDL 65 10/25/2014 0838   CHOLHDL 2.7 10/25/2014 0838   VLDL 26 10/25/2014 0838   LDLCALC 82 10/25/2014 0838     RADIOLOGY: Ct Lumbar Spine Wo  Contrast  11/10/2013   CLINICAL DATA:  Low back pain.  Left leg weakness.  EXAM: CT LUMBAR SPINE WITHOUT CONTRAST  TECHNIQUE: Multidetector CT imaging of the lumbar spine was performed without intravenous contrast administration. Multiplanar CT image reconstructions were also generated.  COMPARISON:  Lumbar spine radiographs 09/23/2012.  FINDINGS: Mild degenerative scoliosis convex left mid lumbar region. Anatomic alignment except for 2 mm facet mediated slip L5-S1. Advanced disc space narrowing L3-4 and L4-5. No worrisome osseous lesions. Atherosclerosis of the aorta, non aneurysmal. Unremarkable SI joints.  At L1-L2, there is a large extraforaminal spur to the left. There is some left-sided foraminal narrowing which could affect the L1 nerve root. Slight left lateral recess stenosis without clear-cut L2 nerve root impingement.  At L2-L3, there is mild disc space narrowing. There is moderate facet ligamentum flavum hypertrophy. Mild left and moderate right neural foraminal narrowing could affect the L2 nerve roots. Mild central canal stenosis without definite L3 nerve root impingement.  At L3-L4, there is multifactorial spinal stenosis related to right greater than left disc space narrowing, central protrusion which is partially calcified, osteophytic spurring, as well as posterior element hypertrophy. Bilateral L3 and L4 nerve root impingement are likely, right worse than left.  At L4-5, there is a large calcified right paracentral central disc extrusion associated with moderate to severe disc space narrowing. Bilateral foraminal narrowing is evident. Advanced posterior element hypertrophy. Right greater than left L5 nerve root impingement in the canal with bilateral L4 nerve root impingement in the foramina.  At L5-S1 there is 2 mm of facet mediated anterolisthesis. There is mild annular bulging. Advanced facet overgrowth. Bilateral L5 nerve root impingement is likely due to foraminal narrowing. No definite S1  nerve root compression in the canal.  IMPRESSION: Multilevel spondylosis as described. Potentially symptomatic left-sided neural compression is observed at L1-2, L3-4, L4-5, and L5-S1. See discussion above.  If further investigation is desired, consider CT myelography with flexion extension views.   Electronically Signed   By: Rolla Flatten M.D.   On: 11/10/2013 14:32   IMPRESSION: 1. Permanent atrial fibrillation   2. Pulmonary embolism without acute cor pulmonale, unspecified chronicity, unspecified pulmonary embolism type ([place])   3. Long term current use of anticoagulant therapy   4. Essential hypertension   5. OSA (obstructive sleep apnea)   6. Paget disease, extra-mammary   7. Obesity, Class II, BMI 35-39.9   8. Discomfort of back     ASSESSMENT AND PLAN: Cameron. Khalen Sharp is an 82 year-old gentleman who has a long-standing history of permanent atrial fibrillation, for which he is on Coumadin anticoagulation.  He has a Optometrist, and this is functioning appropriately.  I reviewed his recent evaluation with Dr. Sallyanne Kuster he has normal device function and normal lead parameters.  His blood pressure today remains controlled with valsartan HCT 160/12.5 in addition to metoprolol 12.5 mg in the morning.  His atrial fibrillation rate is controlled.  He is on warfarin for anticoagulation and does home monitoring.  There is a remote history of PE which has been stable.  He admits to 100% CPAP use and with a new ResMed F 30 mask [employer]ear technology he no longer has a full facemask on  the bridge of his nose is tolerating this much better.  He continues to be on atorvastatin 20 mg for hyperlipidemia.  Laboratory in June 2019 showed an LDL cholesterol at 79.  He is not having any bleeding.  He continues to have moderate obesity with a weight of 282 but remotely his peak weight has been 353 and he had lost down to 264. In  6 months I have recommended he undergo a follow-up echo  Doppler study since his last evaluation was in March 2016.  I will see him in the office for follow-up evaluation following his echo Doppler assessment.  Time spent: 25 minutes  Troy Sine, MD, [facility]  04/29/2018 1:19 PM

## 2018-04-22 NOTE — Patient Instructions (Signed)
Medication Instructions:  Your physician recommends that you continue on your current medications as directed. Please refer to the Current Medication list given to you today.  Testing/Procedures: Your physician has requested that you have an echocardiogram in 6 months. Echocardiography is a painless test that uses sound waves to create images of your heart. It provides your doctor with information about the size and shape of your heart and how well your heart's chambers and valves are working. This procedure takes approximately one hour. There are no restrictions for this procedure.  Follow-Up: Your physician wants you to follow-up in: 6 months with Dr. Claiborne Billings.  You will receive a reminder letter in the mail two months in advance. If you don't receive a letter, please call our office to schedule the follow-up appointment.   Any Other Special Instructions Will Be Listed Below (If Applicable).     If you need a refill on your cardiac medications before your next appointment, please call your pharmacy.

## 2018-04-24 ENCOUNTER — Encounter: Payer: Self-pay | Admitting: Cardiovascular Disease

## 2018-04-24 ENCOUNTER — Ambulatory Visit (INDEPENDENT_AMBULATORY_CARE_PROVIDER_SITE_OTHER): Payer: Medicare Other | Admitting: Cardiovascular Disease

## 2018-04-24 VITALS — BP 128/82 | HR 61 | Ht 75.0 in | Wt 284.0 lb

## 2018-04-24 DIAGNOSIS — I4821 Permanent atrial fibrillation: Secondary | ICD-10-CM

## 2018-04-24 DIAGNOSIS — I10 Essential (primary) hypertension: Secondary | ICD-10-CM

## 2018-04-24 DIAGNOSIS — G4733 Obstructive sleep apnea (adult) (pediatric): Secondary | ICD-10-CM

## 2018-04-24 DIAGNOSIS — Z950 Presence of cardiac pacemaker: Secondary | ICD-10-CM

## 2018-04-24 DIAGNOSIS — Z7901 Long term (current) use of anticoagulants: Secondary | ICD-10-CM | POA: Diagnosis not present

## 2018-04-24 DIAGNOSIS — Z6835 Body mass index (BMI) 35.0-35.9, adult: Secondary | ICD-10-CM | POA: Diagnosis not present

## 2018-04-24 MED ORDER — APIXABAN 5 MG PO TABS: 5.0000 mg | ORAL_TABLET | Freq: Two times a day (BID) | ORAL | 5 refills | Status: DC

## 2018-04-24 NOTE — Patient Instructions (Addendum)
Dr Sallyanne Kuster recommends that you schedule a follow-up appointment in 6 months with a pacemaker check. You will receive a reminder letter in the mail two months in advance. If you don't receive a letter, please call our office to schedule the follow-up appointment.  If you need a refill on your cardiac medications before your next appointment, please call your pharmacy.   Your physician has recommended you make the following change in your medication: 1. STOP Warfarin 2. START Eliquis 5 mg twice daily

## 2018-04-25 ENCOUNTER — Encounter: Payer: Self-pay | Admitting: Cardiovascular Disease

## 2018-04-25 ENCOUNTER — Ambulatory Visit (INDEPENDENT_AMBULATORY_CARE_PROVIDER_SITE_OTHER): Payer: Medicare Other | Admitting: Pharmacist

## 2018-04-25 DIAGNOSIS — I2699 Other pulmonary embolism without acute cor pulmonale: Secondary | ICD-10-CM

## 2018-04-25 DIAGNOSIS — I482 Chronic atrial fibrillation, unspecified: Secondary | ICD-10-CM

## 2018-04-25 DIAGNOSIS — I4821 Permanent atrial fibrillation: Secondary | ICD-10-CM

## 2018-04-25 DIAGNOSIS — Z7901 Long term (current) use of anticoagulants: Secondary | ICD-10-CM

## 2018-04-25 LAB — POCT INR: INR: 2.5 (ref 2.0–3.0)

## 2018-04-25 NOTE — Progress Notes (Signed)
Cardiology Office Note    Date:  04/25/2018   ID:  Cameron Sharp, DOB September 25, 1935, MRN [id number]  PCP:  [name], MD  Cardiologist: Shelva Majestic, M.D.;  Sanda Klein, MD   Chief Complaint  Patient presents with  . Pacemaker Check    History of Present Illness:  Cameron Sharp is a 82 y.o. male with after with slow ventricular response and a single-chamber [employer]. Jude Zephyr pacemaker implanted in 2010, as well severe obesity, obstructive sleep apnea, hypertension and hyperlipidemia.   He just saw Dr. Claiborne Billings a couple of days ago.  They discussed transition from warfarin to Eliquis and he has been tempted to do so.  Although he has not had bleeding problems and his INR is usually in target range, he has had a lot of issues with starting and stopping warfarin anticoagulation for his back injections and "bridging" with enoxaparin.  All of his complaints are related to his back pain.  He is using a cane.  He has no cardiovascular issues today.  Pacemaker interrogation shows normal device function. He has 62% ventricular pacing and there are no episodes of high ventricular rates. Heart rate histograms remain slightly blunted, appropriate for his activity level Estimated generator longevity is 4-5.75 years. Lead parameters are normal.   Past Medical History:  Diagnosis Date  . Arthritis    back and neck  . Atrial fibrillation ([place]) 10/10/2012  . Chronic back pain   . Gout   . HTN (hypertension) 05/01/2013  . Hyperlipidemia   . Hypertension   . OSA on CPAP    wears CPAP nightly  . Pacemaker   . Pacemaker -[place] 2010 05/01/2013  . Paget disease, extramammary    left groin  . Permanent atrial fibrillation     Past Surgical History:  Procedure Laterality Date  . carpel tunnel surgery Right 2012  . CHOLECYSTECTOMY    . COLONOSCOPY    . CYST EXCISION Right 05/08/2017   Procedure: REEXCISION OF EXTRA MAMARY PAGETS DISEASE OF RIGHT GROIN;  Surgeon:  [name], DO;  Location: [facility];  Service: Plastics;  Laterality: Right;  . EXCISION MASS LOWER EXTREMETIES Left 09/26/2017   Procedure: Excision of left groin extra mammary Paget's disease;  Surgeon: [name], DO;  Location: [facility];  Service: Plastics;  Laterality: Left;  . EXCISION MASS LOWER EXTREMETIES Left 10/22/2017   Procedure: RE-EXCISION OF LEFT GROIN EXTRA MAMMARY PAGET'S DISEASE;  Surgeon: [name], DO;  Location: [facility];  Service: Plastics;  Laterality: Left;  . GALLBLADDER SURGERY  12/2006  . LESION EXCISION WITH COMPLEX REPAIR Bilateral 07/31/2017   Procedure: BIOPSY OF BILATERAL GROIN  CHANGING SKIN LESIONS;  Surgeon: [name], DO;  Location: [facility];  Service: Plastics;  Laterality: Bilateral;  . LIPOMA EXCISION N/A 11/01/2016   Procedure: EXCISION OF SEBACEOUS CYST ON BACK;  Surgeon: [name], DO;  Location: [facility];  Service: Plastics;  Laterality: N/A;  . MASS EXCISION N/A 11/01/2016   Procedure: EXCISION OF RIGHT GROIN SKIN LESION;  Surgeon: [name], DO;  Location: [facility];  Service: Plastics;  Laterality: N/A;  . MASS EXCISION Right 11/15/2016   Procedure: EXCISION POSITIVE MARGIN RIGHT GROIN;  Surgeon: [name], DO;  Location: [facility];  Service: Plastics;  Laterality: Right;  . MASS EXCISION Right 04/04/2017   Procedure: REEXCISION OF EXTRA MAMARY PAGETS SKIN  DISEASE OF RIGHT GROIN;  Surgeon: [name], DO;  Location: [facility];  Service: Plastics;  Laterality: Right;  . NM MYOCAR PERF WALL MOTION  05/22/07   no significant ischemia  . PERMANENT PACEMAKER INSERTION  01/09/2009   [employer]Jude  . TONSILLECTOMY     age 26  . US ECHOCARDIOGRAPHY  11/24/07   mild LVH,LA & RA mod to severely dilated,mild mitral annular ca+,mild TR,mild Pulmonary hypertensio,AOV  mod. sclerotic,mild AI w/root dilatation and ca+.    Current Medications: Outpatient Medications Prior to Visit  Medication Sig Dispense Refill  . atorvastatin (LIPITOR) 20 MG tablet TAKE 1 TABLET DAILY 90 tablet 3  . baclofen (LIORESAL) 10 MG tablet Take 1 tablet by mouth 2 (two) times daily.  0  . gabapentin (NEURONTIN) 300 MG capsule Take by mouth 3 (three) times daily. PATIENT TAKES 600MG  IN AM, 300MG  AT MID DAY, 600 AT BEDTIME    . HYDROcodone-acetaminophen (NORCO) 5-325 MG tablet Take 1 tablet by mouth every 6 (six) hours as needed for moderate pain. 20 tablet 0  . metoprolol tartrate (LOPRESSOR) 25 MG tablet Take 0.5 tablets (12.5 mg total) by mouth every morning. 45 tablet 3  . Thiamine HCl (VITAMIN B-1) 250 MG tablet Take 250 mg by mouth daily.    Marland Kitchen ULORIC 40 MG tablet Take 1 tablet (40 mg total) by mouth daily. 30 tablet 6  . valsartan-hydrochlorothiazide (DIOVAN-HCT) 160-12.5 MG tablet Take 1 tablet by mouth daily. 30 tablet 5  . COUMADIN 5 MG tablet TAKE 1 and 1/2 TABLET DAILY OR AS DIRECTED 135 tablet 1   No facility-administered medications prior to visit.      Allergies:   Patient has no known allergies.   Social History   Socioeconomic History  . Marital status: Married    Spouse name: Not on file  . Number of children: Not on file  . Years of education: Not on file  . Highest education level: Not on file  Occupational History  . Not on file  Social Needs  . Financial resource strain: Not on file  . Food insecurity:    Worry: Not on file    Inability: Not on file  . Transportation needs:    Medical: Not on file    Non-medical: Not on file  Tobacco Use  . Smoking status: Former Smoker    Packs/day: 3.00    Years: 13.00    Pack years: 39.00    Last attempt to quit: 07/23/1976    Years since quitting: 41.7  . Smokeless tobacco: Never Used  Substance and Sexual Activity  . Alcohol use: Yes    Alcohol/week: 0.0 standard drinks    Comment: 2oz scotch daily    . Drug use: No  . Sexual activity: Not on file  Lifestyle  . Physical activity:    Days per week: Not on file    Minutes per session: Not on file  . Stress: Not on file  Relationships  . Social connections:    Talks on phone: Not on file    Gets together: Not on file    Attends religious service: Not on file    Active member of club or organization: Not on file    Attends meetings of clubs or organizations: Not on file    Relationship status: Not on file  Other Topics Concern  . Not on file  Social History Narrative  . Not on file     Family History:  The patient's  family history includes Breast cancer in his paternal grandmother; Lung cancer in his maternal grandfather; Prostate cancer in his paternal grandfather; Stomach cancer in his father.   ROS:   Please see the history of present illness.    ROS what other systems are reviewed and are negative   PHYSICAL EXAM:   VS:  BP 128/82 (BP Location: Left Arm, Patient Position: Sitting, Cuff Size: Large)   Pulse 61   Ht 6\' 3"  (1.905 m)   Wt 284 lb (128.8 kg)   BMI 35.50 kg/m     General: Alert, oriented x3, no distress, moderate to severely obese, healthy subclavian pacemaker site Head: no evidence of trauma, PERRL, EOMI, no exophtalmos or lid lag, no myxedema, no xanthelasma; normal ears, nose and oropharynx Neck: normal jugular venous pulsations and no hepatojugular reflux; brisk carotid pulses without delay and no carotid bruits Chest: clear to auscultation, no signs of consolidation by percussion or palpation, normal fremitus, symmetrical and full respiratory excursions Cardiovascular: normal position and quality of the apical impulse, regular rhythm, normal first and paradoxically split second heart sounds, no murmurs, rubs or gallops Abdomen: no tenderness or distention, no masses by palpation, no abnormal pulsatility or arterial bruits, normal bowel sounds, no hepatosplenomegaly Extremities: no clubbing, cyanosis or  edema; 2+ radial, ulnar and brachial pulses bilaterally; 2+ right femoral, posterior tibial and dorsalis pedis pulses; 2+ left femoral, posterior tibial and dorsalis pedis pulses; no subclavian or femoral bruits Neurological: grossly nonfocal Psych: Normal mood and affect   Wt Readings from Last 3 Encounters:  04/24/18 284 lb (128.8 kg)  04/22/18 282 lb (127.9 kg)  02/17/18 275 lb (124.7 kg)      Studies/Labs Reviewed:   EKG:  EKG is not ordered today.  On October 1 his ECG showed atrial fibrillation with intermittent ventricular pacing and chronic inferolateral T wave inversion. Recent Labs: 11/08/2017: ALT 14 11/09/2017: BUN 17; Creatinine, Ser 0.86; Hemoglobin 13.5; Platelets 262; Potassium 4.0; Sodium 141   Lipid Panel    Component Value Date/Time   CHOL 173 10/25/2014 0838   TRIG 129 10/25/2014 0838   HDL 65 10/25/2014 0838   CHOLHDL 2.7 10/25/2014 0838   VLDL 26 10/25/2014 0838   LDLCALC 82 10/25/2014 0838    ASSESSMENT:    1. Permanent atrial fibrillation   2. Pacemaker   3. Long term current use of anticoagulant   4. Essential hypertension   5. OSA (obstructive sleep apnea)   6. Severe obesity (BMI 35.0-35.9 with comorbidity) ([place])      PLAN:  In order of problems listed above:  1. Afib: Despite almost completely stopping his beta-blocker, he has not had episodes of high ventricular rate and still has relatively frequent ventricular pacing at 60%.  I would recommend discontinuation of the beta-blocker altogether, will discuss with Dr. Claiborne Billings. 2. PM: His device is functioning normally and is not amenable to remote downloads, we will bring him back to the office after 6 months 3. Anticoagulation: CHADSVasc 3 (age 20, HTN).  He has considered Dr. Evette Georges recommendations about switching to a direct oral anticoagulant and would like to go ahead with that plan.  Asked our clinical pharmacist to assist with the transition. 4. HTN: Well-controlled.  Actually it remains so  even if he stops his beta-blocker.  Continue with valsartan-hydrochlorothiazide. 5. OSA: He is compliant with CPAP and derives good clinical benefit from that device 6. Obesity: Weight loss efforts have been undermined by an activity related to    Medication  Adjustments/Labs and Tests Ordered: Current medicines are reviewed at length with the patient today.  Concerns regarding medicines are outlined above.  Medication changes, Labs and Tests ordered today are listed in the Patient Instructions below. Patient Instructions  Dr Sallyanne Kuster recommends that you schedule a follow-up appointment in 6 months with a pacemaker check. You will receive a reminder letter in the mail two months in advance. If you don't receive a letter, please call our office to schedule the follow-up appointment.  If you need a refill on your cardiac medications before your next appointment, please call your pharmacy.   Your physician has recommended you make the following change in your medication: 1. STOP Warfarin 2. START Eliquis 5 mg twice daily    Signed, Sanda Klein, MD  04/25/2018 3:28 PM    [address], [place], [place]  [address] Phone: [phone]; Fax: [phone]

## 2018-04-28 DIAGNOSIS — M47817 Spondylosis without myelopathy or radiculopathy, lumbosacral region: Secondary | ICD-10-CM | POA: Diagnosis not present

## 2018-04-29 ENCOUNTER — Encounter: Payer: Self-pay | Admitting: Cardiovascular Disease

## 2018-05-02 DIAGNOSIS — Z23 Encounter for immunization: Secondary | ICD-10-CM | POA: Diagnosis not present

## 2018-05-12 DIAGNOSIS — M47817 Spondylosis without myelopathy or radiculopathy, lumbosacral region: Secondary | ICD-10-CM | POA: Diagnosis not present

## 2018-05-13 ENCOUNTER — Other Ambulatory Visit: Payer: Self-pay | Admitting: Cardiovascular Disease

## 2018-05-19 ENCOUNTER — Other Ambulatory Visit: Payer: Self-pay

## 2018-05-19 ENCOUNTER — Encounter (HOSPITAL_COMMUNITY): Payer: Self-pay

## 2018-05-19 ENCOUNTER — Emergency Department (HOSPITAL_COMMUNITY): Payer: Medicare Other

## 2018-05-19 ENCOUNTER — Inpatient Hospital Stay (HOSPITAL_COMMUNITY)
Admission: EM | Admit: 2018-05-19 | Discharge: 2018-05-25 | DRG: 871 | Disposition: A | Discharge status: Skilled Nursing Facility | Payer: Medicare Other | Attending: Internal Medicine | Admitting: Internal Medicine

## 2018-05-19 DIAGNOSIS — A419 Sepsis, unspecified organism: Secondary | ICD-10-CM | POA: Diagnosis present

## 2018-05-19 DIAGNOSIS — E876 Hypokalemia: Secondary | ICD-10-CM | POA: Diagnosis present

## 2018-05-19 DIAGNOSIS — N410 Acute prostatitis: Secondary | ICD-10-CM | POA: Diagnosis present

## 2018-05-19 DIAGNOSIS — Z87891 Personal history of nicotine dependence: Secondary | ICD-10-CM

## 2018-05-19 DIAGNOSIS — N390 Urinary tract infection, site not specified: Secondary | ICD-10-CM | POA: Diagnosis not present

## 2018-05-19 DIAGNOSIS — I4891 Unspecified atrial fibrillation: Secondary | ICD-10-CM | POA: Diagnosis not present

## 2018-05-19 DIAGNOSIS — Z86711 Personal history of pulmonary embolism: Secondary | ICD-10-CM

## 2018-05-19 DIAGNOSIS — R6520 Severe sepsis without septic shock: Secondary | ICD-10-CM | POA: Diagnosis present

## 2018-05-19 DIAGNOSIS — A4151 Sepsis due to Escherichia coli [E. coli]: Principal | ICD-10-CM | POA: Diagnosis present

## 2018-05-19 DIAGNOSIS — I10 Essential (primary) hypertension: Secondary | ICD-10-CM | POA: Diagnosis present

## 2018-05-19 DIAGNOSIS — D696 Thrombocytopenia, unspecified: Secondary | ICD-10-CM | POA: Diagnosis present

## 2018-05-19 DIAGNOSIS — Z9989 Dependence on other enabling machines and devices: Secondary | ICD-10-CM

## 2018-05-19 DIAGNOSIS — R001 Bradycardia, unspecified: Secondary | ICD-10-CM | POA: Diagnosis present

## 2018-05-19 DIAGNOSIS — G4733 Obstructive sleep apnea (adult) (pediatric): Secondary | ICD-10-CM

## 2018-05-19 DIAGNOSIS — N420 Calculus of prostate: Secondary | ICD-10-CM | POA: Diagnosis present

## 2018-05-19 DIAGNOSIS — I4821 Permanent atrial fibrillation: Secondary | ICD-10-CM | POA: Diagnosis present

## 2018-05-19 DIAGNOSIS — R0689 Other abnormalities of breathing: Secondary | ICD-10-CM | POA: Diagnosis not present

## 2018-05-19 DIAGNOSIS — Z9049 Acquired absence of other specified parts of digestive tract: Secondary | ICD-10-CM

## 2018-05-19 DIAGNOSIS — R6521 Severe sepsis with septic shock: Secondary | ICD-10-CM | POA: Diagnosis present

## 2018-05-19 DIAGNOSIS — N400 Enlarged prostate without lower urinary tract symptoms: Secondary | ICD-10-CM | POA: Diagnosis present

## 2018-05-19 DIAGNOSIS — Z801 Family history of malignant neoplasm of trachea, bronchus and lung: Secondary | ICD-10-CM

## 2018-05-19 DIAGNOSIS — I959 Hypotension, unspecified: Secondary | ICD-10-CM | POA: Diagnosis not present

## 2018-05-19 DIAGNOSIS — K439 Ventral hernia without obstruction or gangrene: Secondary | ICD-10-CM | POA: Diagnosis present

## 2018-05-19 DIAGNOSIS — Z6834 Body mass index (BMI) 34.0-34.9, adult: Secondary | ICD-10-CM

## 2018-05-19 DIAGNOSIS — C4499 Other specified malignant neoplasm of skin, unspecified: Secondary | ICD-10-CM | POA: Diagnosis present

## 2018-05-19 DIAGNOSIS — N179 Acute kidney failure, unspecified: Secondary | ICD-10-CM | POA: Diagnosis present

## 2018-05-19 DIAGNOSIS — M109 Gout, unspecified: Secondary | ICD-10-CM | POA: Diagnosis present

## 2018-05-19 DIAGNOSIS — R531 Weakness: Secondary | ICD-10-CM | POA: Diagnosis not present

## 2018-05-19 DIAGNOSIS — Z79899 Other long term (current) drug therapy: Secondary | ICD-10-CM

## 2018-05-19 DIAGNOSIS — G8929 Other chronic pain: Secondary | ICD-10-CM | POA: Diagnosis present

## 2018-05-19 DIAGNOSIS — N200 Calculus of kidney: Secondary | ICD-10-CM | POA: Diagnosis present

## 2018-05-19 DIAGNOSIS — I119 Hypertensive heart disease without heart failure: Secondary | ICD-10-CM | POA: Diagnosis present

## 2018-05-19 DIAGNOSIS — W1830XA Fall on same level, unspecified, initial encounter: Secondary | ICD-10-CM | POA: Diagnosis present

## 2018-05-19 DIAGNOSIS — D649 Anemia, unspecified: Secondary | ICD-10-CM | POA: Diagnosis present

## 2018-05-19 DIAGNOSIS — E785 Hyperlipidemia, unspecified: Secondary | ICD-10-CM | POA: Diagnosis present

## 2018-05-19 DIAGNOSIS — I708 Atherosclerosis of other arteries: Secondary | ICD-10-CM | POA: Diagnosis present

## 2018-05-19 DIAGNOSIS — Z6835 Body mass index (BMI) 35.0-35.9, adult: Secondary | ICD-10-CM

## 2018-05-19 DIAGNOSIS — R000 Tachycardia, unspecified: Secondary | ICD-10-CM | POA: Diagnosis not present

## 2018-05-19 DIAGNOSIS — Z950 Presence of cardiac pacemaker: Secondary | ICD-10-CM | POA: Diagnosis present

## 2018-05-19 DIAGNOSIS — Z7901 Long term (current) use of anticoagulants: Secondary | ICD-10-CM

## 2018-05-19 DIAGNOSIS — N3000 Acute cystitis without hematuria: Secondary | ICD-10-CM | POA: Diagnosis not present

## 2018-05-19 LAB — I-STAT CG4 LACTIC ACID, ED: LACTIC ACID, VENOUS: 2.25 mmol/L — AB (ref 0.5–1.9)

## 2018-05-19 NOTE — ED Notes (Signed)
Blood collected by [employer], clicked off in error by RN.

## 2018-05-19 NOTE — ED Notes (Signed)
Labs collected.

## 2018-05-19 NOTE — ED Triage Notes (Signed)
Per [facility], pt from home with an initial c/o a lift assist. It was reported that the pt slowly assisted himself to the ground. He complained of weakness and reported a dx of a UTI this morning. He is taking Keflex for tx. No LOC. CAOx4. Pt was found to be hot to touch. Temp 102.0, BP 105/50, RR 30, CBG 111, EtCO2 30-33. 18 ga IV in L hand, 300 mL NS, 1000 mg Tylenol.

## 2018-05-20 ENCOUNTER — Inpatient Hospital Stay (HOSPITAL_COMMUNITY): Payer: Medicare Other

## 2018-05-20 ENCOUNTER — Encounter (HOSPITAL_COMMUNITY): Payer: Self-pay | Admitting: Internal Medicine

## 2018-05-20 DIAGNOSIS — Z6835 Body mass index (BMI) 35.0-35.9, adult: Secondary | ICD-10-CM | POA: Diagnosis not present

## 2018-05-20 DIAGNOSIS — K439 Ventral hernia without obstruction or gangrene: Secondary | ICD-10-CM | POA: Diagnosis present

## 2018-05-20 DIAGNOSIS — W1830XA Fall on same level, unspecified, initial encounter: Secondary | ICD-10-CM | POA: Diagnosis present

## 2018-05-20 DIAGNOSIS — R52 Pain, unspecified: Secondary | ICD-10-CM | POA: Diagnosis not present

## 2018-05-20 DIAGNOSIS — G8929 Other chronic pain: Secondary | ICD-10-CM | POA: Diagnosis present

## 2018-05-20 DIAGNOSIS — I708 Atherosclerosis of other arteries: Secondary | ICD-10-CM | POA: Diagnosis present

## 2018-05-20 DIAGNOSIS — R2681 Unsteadiness on feet: Secondary | ICD-10-CM | POA: Diagnosis not present

## 2018-05-20 DIAGNOSIS — R001 Bradycardia, unspecified: Secondary | ICD-10-CM | POA: Diagnosis present

## 2018-05-20 DIAGNOSIS — R6521 Severe sepsis with septic shock: Secondary | ICD-10-CM | POA: Diagnosis present

## 2018-05-20 DIAGNOSIS — R278 Other lack of coordination: Secondary | ICD-10-CM | POA: Diagnosis not present

## 2018-05-20 DIAGNOSIS — G629 Polyneuropathy, unspecified: Secondary | ICD-10-CM | POA: Diagnosis not present

## 2018-05-20 DIAGNOSIS — Z950 Presence of cardiac pacemaker: Secondary | ICD-10-CM | POA: Diagnosis not present

## 2018-05-20 DIAGNOSIS — A419 Sepsis, unspecified organism: Secondary | ICD-10-CM | POA: Diagnosis not present

## 2018-05-20 DIAGNOSIS — K5900 Constipation, unspecified: Secondary | ICD-10-CM | POA: Diagnosis not present

## 2018-05-20 DIAGNOSIS — Z9989 Dependence on other enabling machines and devices: Secondary | ICD-10-CM | POA: Diagnosis not present

## 2018-05-20 DIAGNOSIS — N390 Urinary tract infection, site not specified: Secondary | ICD-10-CM | POA: Diagnosis present

## 2018-05-20 DIAGNOSIS — M62838 Other muscle spasm: Secondary | ICD-10-CM | POA: Diagnosis not present

## 2018-05-20 DIAGNOSIS — D649 Anemia, unspecified: Secondary | ICD-10-CM | POA: Diagnosis present

## 2018-05-20 DIAGNOSIS — N179 Acute kidney failure, unspecified: Secondary | ICD-10-CM | POA: Diagnosis not present

## 2018-05-20 DIAGNOSIS — Z7401 Bed confinement status: Secondary | ICD-10-CM | POA: Diagnosis not present

## 2018-05-20 DIAGNOSIS — I119 Hypertensive heart disease without heart failure: Secondary | ICD-10-CM | POA: Diagnosis present

## 2018-05-20 DIAGNOSIS — Z7901 Long term (current) use of anticoagulants: Secondary | ICD-10-CM | POA: Diagnosis not present

## 2018-05-20 DIAGNOSIS — E785 Hyperlipidemia, unspecified: Secondary | ICD-10-CM | POA: Diagnosis present

## 2018-05-20 DIAGNOSIS — G4733 Obstructive sleep apnea (adult) (pediatric): Secondary | ICD-10-CM | POA: Diagnosis not present

## 2018-05-20 DIAGNOSIS — N410 Acute prostatitis: Secondary | ICD-10-CM | POA: Diagnosis not present

## 2018-05-20 DIAGNOSIS — B9620 Unspecified Escherichia coli [E. coli] as the cause of diseases classified elsewhere: Secondary | ICD-10-CM | POA: Diagnosis not present

## 2018-05-20 DIAGNOSIS — N200 Calculus of kidney: Secondary | ICD-10-CM | POA: Diagnosis present

## 2018-05-20 DIAGNOSIS — A4151 Sepsis due to Escherichia coli [E. coli]: Secondary | ICD-10-CM | POA: Diagnosis present

## 2018-05-20 DIAGNOSIS — R6520 Severe sepsis without septic shock: Secondary | ICD-10-CM | POA: Diagnosis not present

## 2018-05-20 DIAGNOSIS — R5381 Other malaise: Secondary | ICD-10-CM | POA: Diagnosis not present

## 2018-05-20 DIAGNOSIS — M109 Gout, unspecified: Secondary | ICD-10-CM | POA: Diagnosis present

## 2018-05-20 DIAGNOSIS — M6281 Muscle weakness (generalized): Secondary | ICD-10-CM | POA: Diagnosis not present

## 2018-05-20 DIAGNOSIS — R7881 Bacteremia: Secondary | ICD-10-CM | POA: Diagnosis not present

## 2018-05-20 DIAGNOSIS — N400 Enlarged prostate without lower urinary tract symptoms: Secondary | ICD-10-CM | POA: Diagnosis present

## 2018-05-20 DIAGNOSIS — N3000 Acute cystitis without hematuria: Secondary | ICD-10-CM | POA: Insufficient documentation

## 2018-05-20 DIAGNOSIS — M2550 Pain in unspecified joint: Secondary | ICD-10-CM | POA: Diagnosis not present

## 2018-05-20 DIAGNOSIS — R4181 Age-related cognitive decline: Secondary | ICD-10-CM | POA: Diagnosis not present

## 2018-05-20 DIAGNOSIS — Z6834 Body mass index (BMI) 34.0-34.9, adult: Secondary | ICD-10-CM | POA: Diagnosis not present

## 2018-05-20 DIAGNOSIS — I4821 Permanent atrial fibrillation: Secondary | ICD-10-CM | POA: Diagnosis not present

## 2018-05-20 DIAGNOSIS — I480 Paroxysmal atrial fibrillation: Secondary | ICD-10-CM | POA: Diagnosis not present

## 2018-05-20 DIAGNOSIS — I10 Essential (primary) hypertension: Secondary | ICD-10-CM | POA: Diagnosis not present

## 2018-05-20 DIAGNOSIS — N420 Calculus of prostate: Secondary | ICD-10-CM | POA: Diagnosis present

## 2018-05-20 DIAGNOSIS — D696 Thrombocytopenia, unspecified: Secondary | ICD-10-CM | POA: Diagnosis present

## 2018-05-20 DIAGNOSIS — E876 Hypokalemia: Secondary | ICD-10-CM | POA: Diagnosis present

## 2018-05-20 LAB — BLOOD CULTURE ID PANEL (REFLEXED)
Acinetobacter baumannii: NOT DETECTED
CANDIDA PARAPSILOSIS: NOT DETECTED
CANDIDA TROPICALIS: NOT DETECTED
Candida albicans: NOT DETECTED
Candida glabrata: NOT DETECTED
Candida krusei: NOT DETECTED
Carbapenem resistance: NOT DETECTED
Enterobacter cloacae complex: NOT DETECTED
Enterobacteriaceae species: DETECTED — AB
Enterococcus species: NOT DETECTED
Escherichia coli: DETECTED — AB
HAEMOPHILUS INFLUENZAE: NOT DETECTED
KLEBSIELLA PNEUMONIAE: NOT DETECTED
Klebsiella oxytoca: NOT DETECTED
Listeria monocytogenes: NOT DETECTED
NEISSERIA MENINGITIDIS: NOT DETECTED
PROTEUS SPECIES: NOT DETECTED
Pseudomonas aeruginosa: NOT DETECTED
STAPHYLOCOCCUS AUREUS BCID: NOT DETECTED
STREPTOCOCCUS AGALACTIAE: NOT DETECTED
STREPTOCOCCUS SPECIES: NOT DETECTED
Serratia marcescens: NOT DETECTED
Staphylococcus species: NOT DETECTED
Streptococcus pneumoniae: NOT DETECTED
Streptococcus pyogenes: NOT DETECTED

## 2018-05-20 LAB — CBC WITH DIFFERENTIAL/PLATELET
Abs Immature Granulocytes: 0.24 10*3/uL — ABNORMAL HIGH (ref 0.00–0.07)
Basophils Absolute: 0 10*3/uL (ref 0.0–0.1)
Basophils Relative: 0 %
EOS PCT: 0 %
Eosinophils Absolute: 0.1 10*3/uL (ref 0.0–0.5)
HEMATOCRIT: 42.6 % (ref 39.0–52.0)
HEMOGLOBIN: 14 g/dL (ref 13.0–17.0)
Immature Granulocytes: 2 %
LYMPHS ABS: 0.7 10*3/uL (ref 0.7–4.0)
Lymphocytes Relative: 4 %
MCH: 32.1 pg (ref 26.0–34.0)
MCHC: 32.9 g/dL (ref 30.0–36.0)
MCV: 97.7 fL (ref 80.0–100.0)
MONO ABS: 1.3 10*3/uL — AB (ref 0.1–1.0)
MONOS PCT: 8 %
Neutro Abs: 14.1 10*3/uL — ABNORMAL HIGH (ref 1.7–7.7)
Neutrophils Relative %: 86 %
Platelets: 147 10*3/uL — ABNORMAL LOW (ref 150–400)
RBC: 4.36 MIL/uL (ref 4.22–5.81)
RDW: 13.9 % (ref 11.5–15.5)
WBC: 16.4 10*3/uL — AB (ref 4.0–10.5)
nRBC: 0 % (ref 0.0–0.2)

## 2018-05-20 LAB — CBC
HCT: 37.7 % — ABNORMAL LOW (ref 39.0–52.0)
Hemoglobin: 12.5 g/dL — ABNORMAL LOW (ref 13.0–17.0)
MCH: 32.3 pg (ref 26.0–34.0)
MCHC: 33.2 g/dL (ref 30.0–36.0)
MCV: 97.4 fL (ref 80.0–100.0)
NRBC: 0 % (ref 0.0–0.2)
PLATELETS: 124 10*3/uL — AB (ref 150–400)
RBC: 3.87 MIL/uL — AB (ref 4.22–5.81)
RDW: 14.1 % (ref 11.5–15.5)
WBC: 16.1 10*3/uL — ABNORMAL HIGH (ref 4.0–10.5)

## 2018-05-20 LAB — COMPREHENSIVE METABOLIC PANEL
ALBUMIN: 3.5 g/dL (ref 3.5–5.0)
ALT: 13 U/L (ref 0–44)
AST: 21 U/L (ref 15–41)
Alkaline Phosphatase: 49 U/L (ref 38–126)
Anion gap: 8 (ref 5–15)
BUN: 29 mg/dL — AB (ref 8–23)
CALCIUM: 8.9 mg/dL (ref 8.9–10.3)
CO2: 20 mmol/L — AB (ref 22–32)
Chloride: 107 mmol/L (ref 98–111)
Creatinine, Ser: 1.4 mg/dL — ABNORMAL HIGH (ref 0.61–1.24)
GFR calc Af Amer: 53 mL/min — ABNORMAL LOW (ref >60)
GFR calc non Af Amer: 46 mL/min — ABNORMAL LOW (ref >60)
GLUCOSE: 124 mg/dL — AB (ref 70–99)
Potassium: 3.6 mmol/L (ref 3.5–5.1)
SODIUM: 135 mmol/L (ref 135–145)
Total Bilirubin: 1.3 mg/dL — ABNORMAL HIGH (ref 0.3–1.2)
Total Protein: 6 g/dL — ABNORMAL LOW (ref 6.5–8.1)

## 2018-05-20 LAB — BASIC METABOLIC PANEL
ANION GAP: 5 (ref 5–15)
BUN: 18 mg/dL (ref 8–23)
CALCIUM: 7.6 mg/dL — AB (ref 8.9–10.3)
CO2: 23 mmol/L (ref 22–32)
CREATININE: 1 mg/dL (ref 0.61–1.24)
Chloride: 108 mmol/L (ref 98–111)
GLUCOSE: 130 mg/dL — AB (ref 70–99)
Potassium: 3.3 mmol/L — ABNORMAL LOW (ref 3.5–5.1)
Sodium: 136 mmol/L (ref 135–145)

## 2018-05-20 LAB — URINALYSIS, ROUTINE W REFLEX MICROSCOPIC
Bilirubin Urine: NEGATIVE
Glucose, UA: NEGATIVE mg/dL
KETONES UR: NEGATIVE mg/dL
Nitrite: NEGATIVE
Protein, ur: 30 mg/dL — AB
Specific Gravity, Urine: 1.027 (ref 1.005–1.030)
WBC, UA: >50 WBC/hpf — ABNORMAL HIGH (ref 0–5)
pH: 5 (ref 5.0–8.0)

## 2018-05-20 LAB — MRSA PCR SCREENING: MRSA by PCR: NEGATIVE

## 2018-05-20 LAB — GLUCOSE, CAPILLARY: GLUCOSE-CAPILLARY: 99 mg/dL (ref 70–99)

## 2018-05-20 LAB — PROCALCITONIN: Procalcitonin: 2.31 ng/mL

## 2018-05-20 LAB — I-STAT CG4 LACTIC ACID, ED: LACTIC ACID, VENOUS: 1.6 mmol/L (ref 0.5–1.9)

## 2018-05-20 LAB — LACTIC ACID, PLASMA
Lactic Acid, Venous: 1.4 mmol/L (ref 0.5–1.9)
Lactic Acid, Venous: 1.7 mmol/L (ref 0.5–1.9)

## 2018-05-20 LAB — TROPONIN I: Troponin I: <0.03 ng/mL (ref <0.03)

## 2018-05-20 MED ORDER — BACLOFEN 5 MG HALF TABLET
10.0000 mg | ORAL_TABLET | Freq: Two times a day (BID) | ORAL | Status: DC
  Administered 2018-05-20 – 2018-05-25 (×11): 10 mg via ORAL
  Filled 2018-05-20 (×7): qty 2
  Filled 2018-05-20 (×2): qty 1
  Filled 2018-05-20: qty 2
  Filled 2018-05-20 (×3): qty 1

## 2018-05-20 MED ORDER — ACETAMINOPHEN 500 MG PO TABS
1000.0000 mg | ORAL_TABLET | Freq: Once | ORAL | Status: AC
  Administered 2018-05-20: 1000 mg via ORAL
  Filled 2018-05-20: qty 2

## 2018-05-20 MED ORDER — VITAMIN B-1 100 MG PO TABS
250.0000 mg | ORAL_TABLET | Freq: Every day | ORAL | Status: DC
  Administered 2018-05-20 – 2018-05-25 (×6): 250 mg via ORAL
  Filled 2018-05-20 (×7): qty 3

## 2018-05-20 MED ORDER — HEPARIN (PORCINE) IN NACL 100-0.45 UNIT/ML-% IJ SOLN
1600.0000 [IU]/h | INTRAMUSCULAR | Status: DC
  Administered 2018-05-20: 1600 [IU]/h via INTRAVENOUS
  Filled 2018-05-20: qty 250

## 2018-05-20 MED ORDER — GABAPENTIN 300 MG PO CAPS: 300.0000 mg | ORAL_CAPSULE | ORAL | Status: DC

## 2018-05-20 MED ORDER — SODIUM CHLORIDE 0.9 % IV SOLN
INTRAVENOUS | Status: DC
  Administered 2018-05-20 (×3): via INTRAVENOUS

## 2018-05-20 MED ORDER — SODIUM CHLORIDE 0.9 % IV BOLUS
1000.0000 mL | Freq: Once | INTRAVENOUS | Status: AC
  Administered 2018-05-20: 1000 mL via INTRAVENOUS

## 2018-05-20 MED ORDER — ONDANSETRON HCL 4 MG PO TABS: 4.0000 mg | ORAL_TABLET | Freq: Four times a day (QID) | ORAL | Status: DC | PRN

## 2018-05-20 MED ORDER — ONDANSETRON HCL 4 MG/2ML IJ SOLN: 4.0000 mg | Freq: Four times a day (QID) | INTRAMUSCULAR | Status: DC | PRN

## 2018-05-20 MED ORDER — SODIUM CHLORIDE 0.9 % IV BOLUS
500.0000 mL | Freq: Once | INTRAVENOUS | Status: AC
  Administered 2018-05-20: 500 mL via INTRAVENOUS

## 2018-05-20 MED ORDER — FEBUXOSTAT 40 MG PO TABS
40.0000 mg | ORAL_TABLET | Freq: Every day | ORAL | Status: DC
  Administered 2018-05-20 – 2018-05-25 (×6): 40 mg via ORAL
  Filled 2018-05-20 (×7): qty 1

## 2018-05-20 MED ORDER — GABAPENTIN 300 MG PO CAPS
300.0000 mg | ORAL_CAPSULE | Freq: Every day | ORAL | Status: DC
  Administered 2018-05-20 – 2018-05-24 (×5): 300 mg via ORAL
  Filled 2018-05-20 (×5): qty 1

## 2018-05-20 MED ORDER — ATORVASTATIN CALCIUM 20 MG PO TABS
20.0000 mg | ORAL_TABLET | Freq: Every day | ORAL | Status: DC
  Administered 2018-05-20 – 2018-05-25 (×6): 20 mg via ORAL
  Filled 2018-05-20 (×7): qty 1

## 2018-05-20 MED ORDER — SODIUM CHLORIDE 0.9 % IV SOLN
2.0000 g | INTRAVENOUS | Status: DC
  Administered 2018-05-20 – 2018-05-21 (×3): 2 g via INTRAVENOUS
  Filled 2018-05-20 (×3): qty 20

## 2018-05-20 MED ORDER — GABAPENTIN 300 MG PO CAPS
600.0000 mg | ORAL_CAPSULE | Freq: Every day | ORAL | Status: DC
  Administered 2018-05-20 – 2018-05-25 (×6): 600 mg via ORAL
  Filled 2018-05-20 (×6): qty 2

## 2018-05-20 MED ORDER — PHENYLEPHRINE HCL-NACL 10-0.9 MG/250ML-% IV SOLN
0.0000 ug/min | INTRAVENOUS | Status: DC
  Filled 2018-05-20: qty 250

## 2018-05-20 MED ORDER — SODIUM CHLORIDE 0.9 % IV BOLUS
1000.0000 mL | Freq: Once | INTRAVENOUS | Status: AC
  Administered 2018-05-20 (×2): 1000 mL via INTRAVENOUS

## 2018-05-20 MED ORDER — TRAMADOL HCL 50 MG PO TABS
50.0000 mg | ORAL_TABLET | Freq: Four times a day (QID) | ORAL | Status: DC | PRN
  Administered 2018-05-20 – 2018-05-24 (×5): 50 mg via ORAL
  Filled 2018-05-20 (×5): qty 1

## 2018-05-20 MED ORDER — LIDOCAINE 5 % EX PTCH
1.0000 | MEDICATED_PATCH | CUTANEOUS | Status: DC
  Administered 2018-05-21 – 2018-05-24 (×4): 1 via TRANSDERMAL
  Filled 2018-05-20 (×5): qty 1

## 2018-05-20 MED ORDER — ACETAMINOPHEN 650 MG RE SUPP: 650.0000 mg | Freq: Four times a day (QID) | RECTAL | Status: DC | PRN

## 2018-05-20 MED ORDER — ACETAMINOPHEN 325 MG PO TABS
650.0000 mg | ORAL_TABLET | Freq: Four times a day (QID) | ORAL | Status: DC | PRN
  Administered 2018-05-22 – 2018-05-25 (×3): 650 mg via ORAL
  Filled 2018-05-20 (×3): qty 2

## 2018-05-20 MED ORDER — APIXABAN 5 MG PO TABS
5.0000 mg | ORAL_TABLET | Freq: Two times a day (BID) | ORAL | Status: DC
  Administered 2018-05-20 – 2018-05-25 (×11): 5 mg via ORAL
  Filled 2018-05-20 (×11): qty 1

## 2018-05-20 NOTE — H&P (Addendum)
History and Physical    Cameron Sharp OEU:[id number] DOB: 07/21/1936 DOA: 05/19/2018  PCP: [name], MD  Patient coming from: Home.  Chief Complaint: Weakness.  HPI: Cameron Sharp is a 82 y.o. male with history of atrial fibrillation, pulmonary embolism, sleep apnea, hypertension, hyperlipidemia, history of gout, history of pacemaker placement has been experiencing weakness and increased urinary frequency last 2 days.  Had gone to urologist office yesterday and was prescribed Keflex which patient states he had taken 2 doses.  Last night at home patient was walking was unable to go back to his bed because of weakness and had to sit onto the floor.  Following which patient found it difficult to stand up and patient wife called EMS.  Patient states he did not lose consciousness.  Not have any chest pain shortness of breath nausea vomiting or diarrhea.  Has subjective feeling of cold and chills.  ED Course: In the ER patient was hypotensive and initially tachycardic.  Lactate was elevated at 2.2 with WBC of 16.4.  UA is compatible with UTI.  On exam patient does not have any flank tenderness though patient did have increased low back pain last night.  Chest x-ray did not show anything acute.  EKG showing atrial fibrillation with diffuse T wave changes.  Review of Systems: As per HPI, rest all negative.   Past Medical History:  Diagnosis Date  . Arthritis    back and neck  . Atrial fibrillation ([place]) 10/10/2012  . Chronic back pain   . Gout   . HTN (hypertension) 05/01/2013  . Hyperlipidemia   . Hypertension   . OSA on CPAP    wears CPAP nightly  . Pacemaker   . Pacemaker -[place] 2010 05/01/2013  . Paget disease, extramammary    left groin  . Permanent atrial fibrillation     Past Surgical History:  Procedure Laterality Date  . carpel tunnel surgery Right 2012  . CHOLECYSTECTOMY    . COLONOSCOPY    . CYST EXCISION Right 05/08/2017   Procedure:  REEXCISION OF EXTRA MAMARY PAGETS DISEASE OF RIGHT GROIN;  Surgeon: [name], DO;  Location: [facility];  Service: Plastics;  Laterality: Right;  . EXCISION MASS LOWER EXTREMETIES Left 09/26/2017   Procedure: Excision of left groin extra mammary Paget's disease;  Surgeon: [name], DO;  Location: [facility];  Service: Plastics;  Laterality: Left;  . EXCISION MASS LOWER EXTREMETIES Left 10/22/2017   Procedure: RE-EXCISION OF LEFT GROIN EXTRA MAMMARY PAGET'S DISEASE;  Surgeon: [name], DO;  Location: [facility];  Service: Plastics;  Laterality: Left;  . GALLBLADDER SURGERY  12/2006  . LESION EXCISION WITH COMPLEX REPAIR Bilateral 07/31/2017   Procedure: BIOPSY OF BILATERAL GROIN  CHANGING SKIN LESIONS;  Surgeon: [name], DO;  Location: [facility];  Service: Plastics;  Laterality: Bilateral;  . LIPOMA EXCISION N/A 11/01/2016   Procedure: EXCISION OF SEBACEOUS CYST ON BACK;  Surgeon: [name], DO;  Location: [facility];  Service: Plastics;  Laterality: N/A;  . MASS EXCISION N/A 11/01/2016   Procedure: EXCISION OF RIGHT GROIN SKIN LESION;  Surgeon: [name], DO;  Location: [facility];  Service: Plastics;  Laterality: N/A;  . MASS EXCISION Right 11/15/2016   Procedure: EXCISION POSITIVE MARGIN RIGHT GROIN;  Surgeon: [name], DO;  Location: [facility];  Service: Plastics;  Laterality: Right;  . MASS  EXCISION Right 04/04/2017   Procedure: REEXCISION OF EXTRA MAMARY PAGETS SKIN DISEASE OF RIGHT GROIN;  Surgeon: [name], DO;  Location: [facility];  Service: Plastics;  Laterality: Right;  . NM MYOCAR PERF WALL MOTION  05/22/07   no significant ischemia  . PERMANENT PACEMAKER INSERTION  01/09/2009   [employer]ude  . TONSILLECTOMY     age 54  . US ECHOCARDIOGRAPHY  11/24/07   mild LVH,LA & RA mod to severely  dilated,mild mitral annular ca+,mild TR,mild Pulmonary hypertensio,AOV mod. sclerotic,mild AI w/root dilatation and ca+.     reports that he quit smoking about 41 years ago. He has a 39.00 pack-year smoking history. He has never used smokeless tobacco. He reports that he drinks alcohol. He reports that he does not use drugs.  No Known Allergies  Family History  Problem Relation Age of Onset  . Stomach cancer Father   . Lung cancer Maternal Grandfather   . Breast cancer Paternal Grandmother   . Prostate cancer Paternal Grandfather     Prior to Admission medications   Medication Sig Start Date End Date Taking? Authorizing Provider  apixaban (ELIQUIS) 5 MG TABS tablet Take 1 tablet (5 mg total) by mouth 2 (two) times daily. 04/24/18  Yes Croitoru, Mihai, MD  atorvastatin (LIPITOR) 20 MG tablet TAKE 1 TABLET DAILY Patient taking differently: Take 20 mg by mouth daily.  07/15/17  Yes Croitoru, Mihai, MD  baclofen (LIORESAL) 10 MG tablet Take 10 mg by mouth 2 (two) times daily.  11/04/14  Yes [provider]  cephALEXin (KEFLEX) 500 MG capsule Take 1,000 mg by mouth 2 (two) times daily. FOR 7 DAYS 05/19/18 05/25/18 Yes [provider]  gabapentin (NEURONTIN) 300 MG capsule Take 300-600 mg by mouth See admin instructions. Take 600 mg by mouth in the morning and 300 mg at 4 PM daily   Yes [provider]  metoprolol tartrate (LOPRESSOR) 25 MG tablet Take 0.5 tablets (12.5 mg total) by mouth every morning. 03/27/17  Yes Croitoru, Mihai, MD  NON FORMULARY CPAP: At bedtime   Yes [provider]  Thiamine HCl (VITAMIN B-1) 250 MG tablet Take 250 mg by mouth daily.   Yes [provider]  ULORIC 40 MG tablet Take 1 tablet (40 mg total) by mouth daily. 07/19/16  Yes Newt Minion, MD  valsartan-hydrochlorothiazide (DIOVAN-HCT) 160-12.5 MG tablet Take 1 tablet by mouth daily. 04/10/18  Yes Croitoru, Mihai, MD  HYDROcodone-acetaminophen (NORCO) 5-325 MG tablet Take  1 tablet by mouth every 6 (six) hours as needed for moderate pain. Patient not taking: Reported on 05/20/2018 09/26/17   [name], DO    Physical Exam: Vitals:   05/20/18 0100 05/20/18 0130 05/20/18 0200 05/20/18 0230  BP: (!) 91/58 (!) 98/48 (!) 91/46 (!) 93/56  Pulse: 80 86 80 63  Resp: 12 (!) 23 (!) 23 (!) 26  Temp:      TempSrc:      SpO2: 96% 97% 96% 94%  Weight:      Height:          Constitutional: Moderately built and nourished. Vitals:   05/20/18 0100 05/20/18 0130 05/20/18 0200 05/20/18 0230  BP: (!) 91/58 (!) 98/48 (!) 91/46 (!) 93/56  Pulse: 80 86 80 63  Resp: 12 (!) 23 (!) 23 (!) 26  Temp:      TempSrc:      SpO2: 96% 97% 96% 94%  Weight:      Height:  Eyes: Anicteric no pallor. ENMT: No discharge from the ears eyes nose or mouth. Neck: No mass felt.  No neck rigidity.  No JVD appreciated. Respiratory: No rhonchi or crepitations. Cardiovascular: S1-S2 heard no murmurs appreciated. Abdomen: Soft nontender bowel sounds present. Musculoskeletal: Mild edema of the both lower extremities. Skin: Chronic skin changes. Neurologic: Alert awake oriented to time place and person.  Moves all extremities. Psychiatric: Appears normal per normal affect.   Labs on Admission: I have personally reviewed following labs and imaging studies  CBC: Recent Labs  Lab 05/19/18 2300  WBC 16.4*  NEUTROABS 14.1*  HGB 14.0  HCT 42.6  MCV 97.7  PLT 829*   Basic Metabolic Panel: Recent Labs  Lab 05/19/18 2300  NA 135  K 3.6  CL 107  CO2 20*  GLUCOSE 124*  BUN 29*  CREATININE 1.40*  CALCIUM 8.9   GFR: Estimated Creatinine Clearance: 59.6 mL/min (A) (by C-G formula based on SCr of 1.4 mg/dL (H)). Liver Function Tests: Recent Labs  Lab 05/19/18 2300  AST 21  ALT 13  ALKPHOS 49  BILITOT 1.3*  PROT 6.0*  ALBUMIN 3.5   No results for input(s): LIPASE, AMYLASE in the last 168 hours. No results for input(s): AMMONIA in the last 168  hours. Coagulation Profile: No results for input(s): INR, PROTIME in the last 168 hours. Cardiac Enzymes: No results for input(s): CKTOTAL, CKMB, CKMBINDEX, TROPONINI in the last 168 hours. BNP (last 3 results) No results for input(s): PROBNP in the last 8760 hours. HbA1C: No results for input(s): HGBA1C in the last 72 hours. CBG: No results for input(s): GLUCAP in the last 168 hours. Lipid Profile: No results for input(s): CHOL, HDL, LDLCALC, TRIG, CHOLHDL, LDLDIRECT in the last 72 hours. Thyroid Function Tests: No results for input(s): TSH, T4TOTAL, FREET4, T3FREE, THYROIDAB in the last 72 hours. Anemia Panel: No results for input(s): VITAMINB12, FOLATE, FERRITIN, TIBC, IRON, RETICCTPCT in the last 72 hours. Urine analysis:    Component Value Date/Time   COLORURINE YELLOW 05/20/2018 0125   APPEARANCEUR HAZY (A) 05/20/2018 0125   LABSPEC 1.027 05/20/2018 0125   PHURINE 5.0 05/20/2018 0125   GLUCOSEU NEGATIVE 05/20/2018 0125   HGBUR MODERATE (A) 05/20/2018 0125   BILIRUBINUR NEGATIVE 05/20/2018 0125   KETONESUR NEGATIVE 05/20/2018 0125   PROTEINUR 30 (A) 05/20/2018 0125   NITRITE NEGATIVE 05/20/2018 0125   LEUKOCYTESUR MODERATE (A) 05/20/2018 0125   Sepsis Labs: @LABRCNTIP (procalcitonin:4,lacticidven:4) )No results found for this or any previous visit (from the past 240 hour(s)).   Radiological Exams on Admission: Dg Chest 2 View  Result Date: 05/20/2018 CLINICAL DATA:  Weakness and fever. EXAM: CHEST - 2 VIEW COMPARISON:  10/28/2017 FINDINGS: Left-sided pacemaker unchanged. Lungs are adequately inflated without consolidation or effusion. Mild stable cardiomegaly. Remainder of the exam is unchanged. IMPRESSION: No acute findings. Mild stable cardiomegaly. Electronically Signed   By: Marin Olp M.D.   On: 05/20/2018 01:13    EKG: Independently reviewed.  A. fib with diffuse T wave changes.  Assessment/Plan Principal Problem:   Sepsis ([place]) Active Problems:    Permanent atrial fibrillation   Pacemaker -single-chamber St[employer] Jude Zephyr 2010   OSA on CPAP   Essential hypertension   Severe obesity (BMI 35.0-35.9 with comorbidity) ([facility])   Paget disease, extra mammary    1. Sepsis likely from UTI -patient has been placed on empiric antibiotics.  Follow blood cultures urine cultures continue with hyper aggressive hydration.  Will check lactate levels procalcitonin levels.  CT of the  abdomen to rule out any obstruction. 2. History of pacemaker placement for bradycardia -it is noted that patient's pacemaker is not capturing all beats.  Will get cardiology to see patient. 3. Acute renal failure likely from weakness and hypotension.  Continue with hydration and follow metabolic panel. 4. History of atrial fibrillation on apixaban and and metoprolol.  Will hold metoprolol due to patient being hypotensive.  In anticipation of possible procedure we will keep patient on heparin and hold apixaban. 5. Hypertension presently hypotensive holding antihypertensives. 6. Sleep apnea on CPAP. 7. History of Paget's disease. 8. Hyperlipidemia on statins.  Addendum -since patient remained persistently hypotensive despite fluid bolus per sepsis protocol I have consulted pulmonary critical care and have started patient on Neo-Synephrine.  We will also consult cardiology since patient is not capturing all the beats.   DVT prophylaxis: Heparin. Code Status: Full code. Family Communication: Discussed with patient. Disposition Plan: Home. Consults called: Critical care. Admission status: Inpatient.   Rise Patience MD [facility] Pager [phone].  If 7PM-7AM, please contact night-coverage [email or web address] Password [email or web address]  05/20/2018, 3:16 AM

## 2018-05-20 NOTE — Progress Notes (Signed)
ANTICOAGULATION CONSULT NOTE - Initial Consult  Pharmacy Consult for Heparin (holding Apixaban) Indication: History of pulmonary embolus, history of atrial fibrillation   No Known Allergies  Patient Measurements: Height: 6\' 3"  (190.5 cm) Weight: 272 lb (123.4 kg) IBW/kg (Calculated) : 84.5   Vital Signs: Temp: 98.7 F (37.1 C) (10/29 0341) Temp Source: Oral (10/29 0341) BP: 96/44 (10/29 0515) Pulse Rate: 70 (10/29 0515)  Labs: Recent Labs    05/19/18 2300  HGB 14.0  HCT 42.6  PLT 147*  CREATININE 1.40*    Estimated Creatinine Clearance: 59.6 mL/min (A) (by C-G formula based on SCr of 1.4 mg/dL (H)).   Medical History: Past Medical History:  Diagnosis Date  . Arthritis    back and neck  . Atrial fibrillation ([place]) 10/10/2012  . Chronic back pain   . Gout   . HTN (hypertension) 05/01/2013  . Hyperlipidemia   . Hypertension   . OSA on CPAP    wears CPAP nightly  . Pacemaker   . Pacemaker -[place] 2010 05/01/2013  . Paget disease, extramammary    left groin  . Permanent atrial fibrillation     Assessment: 82 y/o M presents to the ED with weakness, holding Apixaban and starting heparin, Hgb good, Plts 147, Mild renal dysfunction.   Last dose of Apixaban was 10/28 at 2030, will start heparin 12 hours from last dose. Will need to use aPTT to dose for now given Apixaban influence on heparin level.s  Goal of Therapy:  Heparin level 0.3-0.7 units/ml aPTT 66-102 seconds Monitor platelets by anticoagulation protocol: Yes   Plan:  -Start heparin drip at 1600 units/hr at 0830 -1630 aPTT/HL -Daily CBC/aPTT/HL -Monitor for bleeding  Narda Bonds 05/20/2018,5:49 AM

## 2018-05-20 NOTE — ED Notes (Signed)
Report given to Whitney, RN

## 2018-05-20 NOTE — Progress Notes (Signed)
    Device checked by [facility] rep and determined to be functioning appropriately. No changes made at this time.   Barnet Pall, NP-C 05/20/2018, 11:34 AM Pager: [phone]

## 2018-05-20 NOTE — Consult Note (Signed)
NAME:  Cameron Sharp, MRN:  [id number], DOB:  23-Nov-1935, LOS: 0 ADMISSION DATE:  05/19/2018, CONSULTATION DATE:  10/29 REFERRING MD:  Dr. Lajean Silvius, CHIEF COMPLAINT:  hypotension   Brief History   82 year old male with recent outpatient treatment for UTI, now presented 10/28 with complaints of fatigue and urinary urgency. Started on ABX for UTI. Hypotensive despite fluids.   Past Medical History  Atrial fib, pacemaker, OSA on CPAP, HTN, chronic back pain.   Significant Hospital Events   10/28 admit for urosepsis  Consults: date of consult/date signed off & final recs:    Procedures (surgical and bedside):    Significant Diagnostic Tests:  CT renal 10/29 >  Micro Data:  Blood 10/29 > Urine 10/29 >  Antimicrobials:  Keflex 10/26-10/28 Ceftriaxone 10/29 >  Subjective:  Feeling much better now  Objective   Blood pressure (!) 96/44, pulse 70, temperature 98.7 F (37.1 C), temperature source Oral, resp. rate 17, height 6\' 3"  (1.905 m), weight 123.4 kg, SpO2 99 %.        Intake/Output Summary (Last 24 hours) at 05/20/2018 0547 Last data filed at 05/20/2018 0500 Gross per 24 hour  Intake 4634.66 ml  Output -  Net 4634.66 ml   Filed Weights   05/19/18 2307  Weight: 123.4 kg    Examination: General: elderly male in NAD HENT: [place]/AT, PERRL, no JVD Lungs: Clear Cardiovascular: Bradycardic with frequent paced beats on monitor. No MRG Abdomen: Soft, non-tender, non-distended Extremities: No acute deformity or ROM limitation Neuro: alert, oriented, non-focal.  GU: making urine.   Resolved Hospital Problem list     Assessment & Plan:  Severe sepsis secondary to urinary tract infection.   -ABX as above - Follow cultures - Maintenance fluids s/p 3.5 L in ED - Ensure lactic clearing (2.2 on admit) - May need low dose phenylephrine infusion, therefore will transfer to ICU.   Atrial fibrillation: Now hypotensive. Pacemaker only intermittently capturing -  holding oral AC, will give heparin in case he needs any procedures - Cardiac monitoring - Cardiology has been consulted by hospitalists.   OSA on CPAP - QHS CPAP  AKI - hydrate - Follow BMP  Hypertension history - holding metoprolol  Hx padget's - supportive care.   Disposition / Summary of Today's Plan 05/20/18   Admit to ICU for borderline BP. May need pressors.      Diet: NPO for now Pain/Anxiety/Delirium protocol (if indicated): na VAP protocol (if indicated): na DVT prophylaxis: heparin GI prophylaxis: pepcid Hyperglycemia protocol: na Mobility: BR Code Status: Full Family Communication: Patient  Labs   CBC: Recent Labs  Lab 05/19/18 2300  WBC 16.4*  NEUTROABS 14.1*  HGB 14.0  HCT 42.6  MCV 97.7  PLT 147*    Basic Metabolic Panel: Recent Labs  Lab 05/19/18 2300  NA 135  K 3.6  CL 107  CO2 20*  GLUCOSE 124*  BUN 29*  CREATININE 1.40*  CALCIUM 8.9   GFR: Estimated Creatinine Clearance: 59.6 mL/min (A) (by C-G formula based on SCr of 1.4 mg/dL (H)). Recent Labs  Lab 05/19/18 2300 05/19/18 2316 05/20/18 0124 05/20/18 0403  WBC 16.4*  --   --   --   LATICACIDVEN  --  2.25* 1.60 1.4    Liver Function Tests: Recent Labs  Lab 05/19/18 2300  AST 21  ALT 13  ALKPHOS 49  BILITOT 1.3*  PROT 6.0*  ALBUMIN 3.5   No results for input(s): LIPASE, AMYLASE in the last  168 hours. No results for input(s): AMMONIA in the last 168 hours.  ABG    Component Value Date/Time   TCO2 28 05/08/2017 0920     Coagulation Profile: No results for input(s): INR, PROTIME in the last 168 hours.  Cardiac Enzymes: No results for input(s): CKTOTAL, CKMB, CKMBINDEX, TROPONINI in the last 168 hours.  HbA1C: No results found for: HGBA1C  CBG: No results for input(s): GLUCAP in the last 168 hours.  Admitting History of Present Illness.   82 year old male with PMH as below, who was seen by Urology for weakness and was shown to have urinary tract  infection. He was prescribed keflex and sent home. However, his weakness worsened causing him to lower himself to the floor 10/28. He was unable to get up and EMS was called. In the ED he was found to be hypotensive with lactic elevated at 2.2. IVF resuscitation was started and he was placed on empiric antibiotics. Imaging was non-acute. UA is compatible with UTA. Desptie 3.5 L of IVF, he remained hypotensive and PCCM was asked to evaluate for ICU admission.   Review of Systems:   Bolds are positive  Constitutional: weight loss, gain, night sweats, Fevers, chills, fatigue .  HEENT: headaches, Sore throat, sneezing, nasal congestion, post nasal drip, Difficulty swallowing, Tooth/dental problems, visual complaints visual changes, ear ache CV:  chest pain, radiates:,Orthopnea, PND, swelling in lower extremities, dizziness, palpitations, syncope.  GI  heartburn, indigestion, abdominal pain, nausea, vomiting, diarrhea, change in bowel habits, loss of appetite, bloody stools.  Resp: cough, productive: , hemoptysis, dyspnea, chest pain, pleuritic.  Skin: rash or itching or icterus GU: dysuria, change in color of urine, urgency or frequency. flank pain, hematuria  ZO:XWRU pain or swelling. decreased range of motion  Psych: change in mood or affect. depression or anxiety.  Neuro: difficulty with speech, weakness, numbness, ataxia    Past Medical History  He,  has a past medical history of Arthritis, Atrial fibrillation ([place]) (10/10/2012), Chronic back pain, Gout, HTN (hypertension) (05/01/2013), Hyperlipidemia, Hypertension, OSA on CPAP, Pacemaker, Pacemaker -single-chamber [employer] Zephyr 2010 (05/01/2013), Paget disease, extramammary, and Permanent atrial fibrillation.   Surgical History    Past Surgical History:  Procedure Laterality Date  . carpel tunnel surgery Right 2012  . CHOLECYSTECTOMY    . COLONOSCOPY    . CYST EXCISION Right 05/08/2017   Procedure: REEXCISION OF EXTRA MAMARY PAGETS  DISEASE OF RIGHT GROIN;  Surgeon: [name], DO;  Location: [place];  Service: Plastics;  Laterality: Right;  . EXCISION MASS LOWER EXTREMETIES Left 09/26/2017   Procedure: Excision of left groin extra mammary Paget's disease;  Surgeon: [name], DO;  Location: [place];  Service: Plastics;  Laterality: Left;  . EXCISION MASS LOWER EXTREMETIES Left 10/22/2017   Procedure: RE-EXCISION OF LEFT GROIN EXTRA MAMMARY PAGET'S DISEASE;  Surgeon: [name], DO;  Location: [place];  Service: Plastics;  Laterality: Left;  . GALLBLADDER SURGERY  12/2006  . LESION EXCISION WITH COMPLEX REPAIR Bilateral 07/31/2017   Procedure: BIOPSY OF BILATERAL GROIN  CHANGING SKIN LESIONS;  Surgeon: [name], DO;  Location: [place];  Service: Plastics;  Laterality: Bilateral;  . LIPOMA EXCISION N/A 11/01/2016   Procedure: EXCISION OF SEBACEOUS CYST ON BACK;  Surgeon: [name], DO;  Location: [place];  Service: Plastics;  Laterality: N/A;  . MASS EXCISION N/A 11/01/2016   Procedure: EXCISION OF RIGHT GROIN  SKIN LESION;  Surgeon: [name], DO;  Location: [place];  Service: Plastics;  Laterality: N/A;  . MASS EXCISION Right 11/15/2016   Procedure: EXCISION POSITIVE MARGIN RIGHT GROIN;  Surgeon: [name], DO;  Location: [place];  Service: Plastics;  Laterality: Right;  . MASS EXCISION Right 04/04/2017   Procedure: REEXCISION OF EXTRA MAMARY PAGETS SKIN DISEASE OF RIGHT GROIN;  Surgeon: [name], DO;  Location: [place];  Service: Plastics;  Laterality: Right;  . NM MYOCAR PERF WALL MOTION  05/22/07   no significant ischemia  . PERMANENT PACEMAKER INSERTION  01/09/2009   [employer]  . TONSILLECTOMY     age 74  . US ECHOCARDIOGRAPHY  11/24/07   mild LVH,LA & RA mod to severely dilated,mild mitral annular ca+,mild  TR,mild Pulmonary hypertensio,AOV mod. sclerotic,mild AI w/root dilatation and ca+.     Social History   Social History   Socioeconomic History  . Marital status: Married    Spouse name: Not on file  . Number of children: Not on file  . Years of education: Not on file  . Highest education level: Not on file  Occupational History  . Not on file  Social Needs  . Financial resource strain: Not on file  . Food insecurity:    Worry: Not on file    Inability: Not on file  . Transportation needs:    Medical: Not on file    Non-medical: Not on file  Tobacco Use  . Smoking status: Former Smoker    Packs/day: 3.00    Years: 13.00    Pack years: 39.00    Last attempt to quit: 07/23/1976    Years since quitting: 41.8  . Smokeless tobacco: Never Used  Substance and Sexual Activity  . Alcohol use: Yes    Alcohol/week: 0.0 standard drinks    Comment: 2oz scotch daily  . Drug use: No  . Sexual activity: Not on file  Lifestyle  . Physical activity:    Days per week: Not on file    Minutes per session: Not on file  . Stress: Not on file  Relationships  . Social connections:    Talks on phone: Not on file    Gets together: Not on file    Attends religious service: Not on file    Active member of club or organization: Not on file    Attends meetings of clubs or organizations: Not on file    Relationship status: Not on file  . Intimate partner violence:    Fear of current or ex partner: Not on file    Emotionally abused: Not on file    Physically abused: Not on file    Forced sexual activity: Not on file  Other Topics Concern  . Not on file  Social History Narrative  . Not on file  ,  reports that he quit smoking about 41 years ago. He has a 39.00 pack-year smoking history. He has never used smokeless tobacco. He reports that he drinks alcohol. He reports that he does not use drugs.   Family History   His family history includes Breast cancer in his paternal grandmother; Lung  cancer in his maternal grandfather; Prostate cancer in his paternal grandfather; Stomach cancer in his father.   Allergies No Known Allergies   Home Medications  Prior to Admission medications   Medication Sig Start Date End Date Taking? Authorizing Provider  apixaban (ELIQUIS) 5 MG TABS tablet  Take 1 tablet (5 mg total) by mouth 2 (two) times daily. 04/24/18  Yes Croitoru, Mihai, MD  atorvastatin (LIPITOR) 20 MG tablet TAKE 1 TABLET DAILY Patient taking differently: Take 20 mg by mouth daily.  07/15/17  Yes Croitoru, Mihai, MD  baclofen (LIORESAL) 10 MG tablet Take 10 mg by mouth 2 (two) times daily.  11/04/14  Yes [provider]  cephALEXin (KEFLEX) 500 MG capsule Take 1,000 mg by mouth 2 (two) times daily. FOR 7 DAYS 05/19/18 05/25/18 Yes [provider]  gabapentin (NEURONTIN) 300 MG capsule Take 300-600 mg by mouth See admin instructions. Take 600 mg by mouth in the morning and 300 mg at 4 PM daily   Yes [provider]  metoprolol tartrate (LOPRESSOR) 25 MG tablet Take 0.5 tablets (12.5 mg total) by mouth every morning. 03/27/17  Yes Croitoru, Mihai, MD  NON FORMULARY CPAP: At bedtime   Yes [provider]  Thiamine HCl (VITAMIN B-1) 250 MG tablet Take 250 mg by mouth daily.   Yes [provider]  ULORIC 40 MG tablet Take 1 tablet (40 mg total) by mouth daily. 07/19/16  Yes Newt Minion, MD  valsartan-hydrochlorothiazide (DIOVAN-HCT) 160-12.5 MG tablet Take 1 tablet by mouth daily. 04/10/18  Yes Croitoru, Mihai, MD  HYDROcodone-acetaminophen (NORCO) 5-325 MG tablet Take 1 tablet by mouth every 6 (six) hours as needed for moderate pain. Patient not taking: Reported on 05/20/2018 09/26/17   Dillingham, Loel Lofty, DO     Critical care time: 11 mins     Georgann Housekeeper, AGACNP-BC [place] Pager [phone] or [phone]  05/20/2018 6:24 AM

## 2018-05-20 NOTE — ED Notes (Signed)
Pt desiring pain pill- RN to page MD

## 2018-05-20 NOTE — Progress Notes (Signed)
[place] PCCM PM Follow Up  S: Device interrogated by Cardiology, functioning appropriately.  Pt reports urinary frequency, urgency, difficulty initiating stream.    O: Blood pressure 101/63, pulse (!) 187, temperature 98.7 F (37.1 C), temperature source Oral, resp. rate (!) 28, height 6\' 3"  (1.905 m), weight 123.4 kg, SpO2 94 %.]  General: elderly male, appears younger than stated age, no acute distress  HEENT: MM pink/moist Neuro: AAOx4, speech clear, MAE CV: s1s2 rrr, no m/r/g, cap refill ~ 2 seconds  PULM: even/non-labored, lungs bilaterally clear  XH:FSFS, non-tender, bsx4 active  Extremities: warm/dry, trace BLE edema with changes consistent with chronic venous insufficiency   Skin: no rashes or lesions  A: Severe Sepsis in setting of UTI  Atrial Fibrillation  Hypotension in setting of Sepsis/UTI  Obstructive Sleep Apnea  AKI  68mm Non-Obstructing Left Renal Stone  Prominent Prostate on CT  Chronic Back Pain   P: See H&P from 0600 for full plan details Repeat lab cbc, bmp now  Spoke with Dr. Alinda Money from Urology.  Will obtain bladder ultrasound to ensure bladder emptying.  If not fully emptying, would on then consider addition of flomax.  He will need follow up 2 weeks post discharge.   He had a culture in the Urology office on 10/28, they will follow and contact us with the culture results. Follow blood, urine culture from admission  Additional 500 ml NS bolus x1 now Continue rocephin for UTI, narrow abx when cultures return Transition back to Eliquis per Pharmacy  PRN ultram for back pain    Transfer to [facility] as of 10/30.   Noe Gens, NP-C [facility] Pgr: [phone] or if no answer [phone] 05/20/2018, 11:55 AM

## 2018-05-20 NOTE — Progress Notes (Signed)
PHARMACY - PHYSICIAN COMMUNICATION CRITICAL VALUE ALERT - BLOOD CULTURE IDENTIFICATION (BCID)  Cameron Sharp is an 82 y.o. male who presented to [facility] on 05/19/2018 with a chief complaint of weakness and hypotension.  Assessment:  Blood cx showing GNRs. BCID reports E. Coli. Tmax of 102 yesterday, WBC 16.1.  Name of physician (or Provider) Contacted: Cameron Sharp  Current antibiotics: Ceftriaxone  Changes to prescribed antibiotics recommended:  Continue ceftriaxone. No change in therapy needed Follow up cx susceptibility  Results for orders placed or performed during the hospital encounter of 05/19/18  Blood Culture ID Panel (Reflexed) (Collected: 05/19/2018 11:00 PM)  Result Value Ref Range   Enterococcus species NOT DETECTED NOT DETECTED   Listeria monocytogenes NOT DETECTED NOT DETECTED   Staphylococcus species NOT DETECTED NOT DETECTED   Staphylococcus aureus (BCID) NOT DETECTED NOT DETECTED   Streptococcus species NOT DETECTED NOT DETECTED   Streptococcus agalactiae NOT DETECTED NOT DETECTED   Streptococcus pneumoniae NOT DETECTED NOT DETECTED   Streptococcus pyogenes NOT DETECTED NOT DETECTED   Acinetobacter baumannii NOT DETECTED NOT DETECTED   Enterobacteriaceae species DETECTED (A) NOT DETECTED   Enterobacter cloacae complex NOT DETECTED NOT DETECTED   Escherichia coli DETECTED (A) NOT DETECTED   Klebsiella oxytoca NOT DETECTED NOT DETECTED   Klebsiella pneumoniae NOT DETECTED NOT DETECTED   Proteus species NOT DETECTED NOT DETECTED   Serratia marcescens NOT DETECTED NOT DETECTED   Carbapenem resistance NOT DETECTED NOT DETECTED   Haemophilus influenzae NOT DETECTED NOT DETECTED   Neisseria meningitidis NOT DETECTED NOT DETECTED   Pseudomonas aeruginosa NOT DETECTED NOT DETECTED   Candida albicans NOT DETECTED NOT DETECTED   Candida glabrata NOT DETECTED NOT DETECTED   Candida krusei NOT DETECTED NOT DETECTED   Candida parapsilosis NOT DETECTED NOT DETECTED    Candida tropicalis NOT DETECTED NOT DETECTED    Cameron Sharp 05/20/2018  7:16 PM

## 2018-05-20 NOTE — ED Notes (Signed)
Patient transported to CT 

## 2018-05-20 NOTE — ED Notes (Addendum)
[employer] rep at bedside; device interrogated; and she stated device working well; Report at bedside.

## 2018-05-20 NOTE — ED Provider Notes (Signed)
[facility] EMERGENCY DEPARTMENT Provider Note  CSN: [id number] Arrival date & time: 05/19/18 2249  Chief Complaint(s) No chief complaint on file.  HPI Cameron Sharp is a 82 y.o. male with a past medical history listed below who presents to the emergency department with 2 days of generalized fatigue and 1 day of urgency.  Patient was seen by urologist earlier this afternoon and diagnosed with a urinary tract infection, placed on Keflex.  With after going home the patient's fatigue worsen causing him to have a mechanical fall onto his buttocks while trying to maneuver around his bed.  Patient denies any acute injuries during the fall.  He does endorse subjective fevers noted by wife.  Denies any nausea or vomiting.  No chest pain or shortness of breath.  No coughing.  No abdominal pain or diarrhea.  The history is provided by the patient.    Past Medical History Past Medical History:  Diagnosis Date  . Arthritis    back and neck  . Atrial fibrillation ([place]) 10/10/2012  . Chronic back pain   . Gout   . HTN (hypertension) 05/01/2013  . Hyperlipidemia   . Hypertension   . OSA on CPAP    wears CPAP nightly  . Pacemaker   . Pacemaker -[place] 2010 05/01/2013  . Paget disease, extramammary    left groin  . Permanent atrial fibrillation    Patient Active Problem List   Diagnosis Date Noted  . Hemoptysis 11/07/2017  . Paget disease of bone   . Atrial fibrillation, chronic   . Paget disease, extra mammary 05/30/2017  . Varicose veins of leg with pain 06/07/2015  . Venous insufficiency of right leg 05/07/2015  . Morbid obesity ([place]) 11/13/2014  . Degenerative disc disease 11/29/2013  . Cellulitis of left leg 07/24/2013  . Pacemaker -[place] 2010 05/01/2013  . Hyperlipidemia 05/01/2013  . OSA on CPAP 05/01/2013  . Essential hypertension 05/01/2013  . Severe obesity (BMI 35.0-35.9 with comorbidity) ([place]) 05/01/2013    . Diarrhea 05/01/2013  . Permanent atrial fibrillation 10/10/2012   Home Medication(s) Prior to Admission medications   Medication Sig Start Date End Date Taking? Authorizing Provider  apixaban (ELIQUIS) 5 MG TABS tablet Take 1 tablet (5 mg total) by mouth 2 (two) times daily. 04/24/18   Croitoru, Mihai, MD  atorvastatin (LIPITOR) 20 MG tablet TAKE 1 TABLET DAILY 07/15/17   Croitoru, Mihai, MD  baclofen (LIORESAL) 10 MG tablet Take 1 tablet by mouth 2 (two) times daily. 11/04/14   [provider]  gabapentin (NEURONTIN) 300 MG capsule Take by mouth 3 (three) times daily. PATIENT TAKES 600MG  IN AM, 300MG  AT MID DAY, 600 AT BEDTIME    [provider]  HYDROcodone-acetaminophen (NORCO) 5-325 MG tablet Take 1 tablet by mouth every 6 (six) hours as needed for moderate pain. 09/26/17   Dillingham, Loel Lofty, DO  metoprolol tartrate (LOPRESSOR) 25 MG tablet Take 0.5 tablets (12.5 mg total) by mouth every morning. 03/27/17   Croitoru, Mihai, MD  Thiamine HCl (VITAMIN B-1) 250 MG tablet Take 250 mg by mouth daily.    [provider]  ULORIC 40 MG tablet Take 1 tablet (40 mg total) by mouth daily. 07/19/16   Newt Minion, MD  valsartan-hydrochlorothiazide (DIOVAN-HCT) 160-12.5 MG tablet Take 1 tablet by mouth daily. 04/10/18   Croitoru, Dani Gobble, MD  Past Surgical History Past Surgical History:  Procedure Laterality Date  . carpel tunnel surgery Right 2012  . CHOLECYSTECTOMY    . COLONOSCOPY    . CYST EXCISION Right 05/08/2017   Procedure: REEXCISION OF EXTRA MAMARY PAGETS DISEASE OF RIGHT GROIN;  Surgeon: [name], DO;  Location: [facility];  Service: Plastics;  Laterality: Right;  . EXCISION MASS LOWER EXTREMETIES Left 09/26/2017   Procedure: Excision of left groin extra mammary Paget's disease;  Surgeon: [name],  DO;  Location: [facility];  Service: Plastics;  Laterality: Left;  . EXCISION MASS LOWER EXTREMETIES Left 10/22/2017   Procedure: RE-EXCISION OF LEFT GROIN EXTRA MAMMARY PAGET'S DISEASE;  Surgeon: [name], DO;  Location: [facility];  Service: Plastics;  Laterality: Left;  . GALLBLADDER SURGERY  12/2006  . LESION EXCISION WITH COMPLEX REPAIR Bilateral 07/31/2017   Procedure: BIOPSY OF BILATERAL GROIN  CHANGING SKIN LESIONS;  Surgeon: [name], DO;  Location: [facility];  Service: Plastics;  Laterality: Bilateral;  . LIPOMA EXCISION N/A 11/01/2016   Procedure: EXCISION OF SEBACEOUS CYST ON BACK;  Surgeon: [name], DO;  Location: [facility];  Service: Plastics;  Laterality: N/A;  . MASS EXCISION N/A 11/01/2016   Procedure: EXCISION OF RIGHT GROIN SKIN LESION;  Surgeon: [name], DO;  Location: [facility];  Service: Plastics;  Laterality: N/A;  . MASS EXCISION Right 11/15/2016   Procedure: EXCISION POSITIVE MARGIN RIGHT GROIN;  Surgeon: [name], DO;  Location: [facility];  Service: Plastics;  Laterality: Right;  . MASS EXCISION Right 04/04/2017   Procedure: REEXCISION OF EXTRA MAMARY PAGETS SKIN DISEASE OF RIGHT GROIN;  Surgeon: [name], DO;  Location: [facility];  Service: Plastics;  Laterality: Right;  . NM MYOCAR PERF WALL MOTION  05/22/07   no significant ischemia  . PERMANENT PACEMAKER INSERTION  01/09/2009   [place]  . TONSILLECTOMY     age 40  . US ECHOCARDIOGRAPHY  11/24/07   mild LVH,LA & RA mod to severely dilated,mild mitral annular ca+,mild TR,mild Pulmonary hypertensio,AOV mod. sclerotic,mild AI w/root dilatation and ca+.   Family History Family History  Problem Relation Age of Onset  . Stomach cancer Father   . Lung cancer Maternal Grandfather   . Breast cancer Paternal Grandmother   . Prostate cancer Paternal  Grandfather     Social History Social History   Tobacco Use  . Smoking status: Former Smoker    Packs/day: 3.00    Years: 13.00    Pack years: 39.00    Last attempt to quit: 07/23/1976    Years since quitting: 41.8  . Smokeless tobacco: Never Used  Substance Use Topics  . Alcohol use: Yes    Alcohol/week: 0.0 standard drinks    Comment: 2oz scotch daily  . Drug use: No   Allergies Patient has no known allergies.  Review of Systems Review of Systems All other systems are reviewed and are negative for acute change except as noted in the HPI  Physical Exam Vital Signs  I have reviewed the triage vital signs BP (!) 90/52 (BP Location: Right Arm)   Pulse 97   Temp (!) 102 F (38.9 C) (Rectal)   Resp (!) 29   Ht 6\' 3"  (1.905 m)   Wt 123.4 kg   SpO2 96%   BMI 34.00 kg/m   Physical Exam  Constitutional: He is  oriented to person, place, and time. He appears well-developed and well-nourished. No distress.  HENT:  Head: Normocephalic and atraumatic.  Nose: Nose normal.  Eyes: Pupils are equal, round, and reactive to light. Conjunctivae and EOM are normal. Right eye exhibits no discharge. Left eye exhibits no discharge. No scleral icterus.  Neck: Normal range of motion. Neck supple.  Cardiovascular: Normal rate and regular rhythm. Exam reveals no gallop and no friction rub.  No murmur heard. Pulmonary/Chest: Effort normal and breath sounds normal. No stridor. No respiratory distress. He has no rales.  Abdominal: Soft. He exhibits no distension. There is no tenderness.  Musculoskeletal: He exhibits no edema or tenderness.  Neurological: He is alert and oriented to person, place, and time.  Spine Exam: Strength: 5/5 throughout LE bilaterally  Sensation: Intact to light touch in proximal and distal LE bilaterally Reflexes: 1+ quadriceps and achilles reflexes    Skin: Skin is warm and dry. No rash noted. He is not diaphoretic. No erythema.  Psychiatric: He has a normal mood  and affect.  Vitals reviewed.   ED Results and Treatments Labs (all labs ordered are listed, but only abnormal results are displayed) Labs Reviewed  COMPREHENSIVE METABOLIC PANEL - Abnormal; Notable for the following components:      Result Value   CO2 20 (*)    Glucose, Bld 124 (*)    BUN 29 (*)    Creatinine, Ser 1.40 (*)    Total Protein 6.0 (*)    Total Bilirubin 1.3 (*)    GFR calc non Af Amer 46 (*)    GFR calc Af Amer 53 (*)    All other components within normal limits  CBC WITH DIFFERENTIAL/PLATELET - Abnormal; Notable for the following components:   WBC 16.4 (*)    Platelets 147 (*)    Neutro Abs 14.1 (*)    Monocytes Absolute 1.3 (*)    Abs Immature Granulocytes 0.24 (*)    All other components within normal limits  URINALYSIS, ROUTINE W REFLEX MICROSCOPIC - Abnormal; Notable for the following components:   APPearance HAZY (*)    Hgb urine dipstick MODERATE (*)    Protein, ur 30 (*)    Leukocytes, UA MODERATE (*)    WBC, UA >50 (*)    Bacteria, UA RARE (*)    All other components within normal limits  I-STAT CG4 LACTIC ACID, ED - Abnormal; Notable for the following components:   Lactic Acid, Venous 2.25 (*)    All other components within normal limits  URINE CULTURE  I-STAT CG4 LACTIC ACID, ED                                                                                                                         EKG  EKG Interpretation  Date/Time:  Monday May 19 2018 23:00:52 EDT Ventricular Rate:  94 PR Interval:    QRS Duration: 106 QT Interval:  333 QTC Calculation: 417 R Axis:   97 Text  Interpretation:  Atrial fibrillation Right axis deviation Low voltage, extremity and precordial leads Nonspecific T abnormalities, inferior leads Borderline ST elevation, lateral leads NO STEMI No old tracing to compare Confirmed by Addison Lank [phone]) on 05/20/2018 12:21:49 AM      Radiology Dg Chest 2 View  Result Date: 05/20/2018 CLINICAL DATA:  Weakness  and fever. EXAM: CHEST - 2 VIEW COMPARISON:  10/28/2017 FINDINGS: Left-sided pacemaker unchanged. Lungs are adequately inflated without consolidation or effusion. Mild stable cardiomegaly. Remainder of the exam is unchanged. IMPRESSION: No acute findings. Mild stable cardiomegaly. Electronically Signed   By: Marin Olp M.D.   On: 05/20/2018 01:13   Pertinent labs & imaging results that were available during my care of the patient were reviewed by me and considered in my medical decision making (see chart for details).  Medications Ordered in ED Medications  cefTRIAXone (ROCEPHIN) 2 g in sodium chloride 0.9 % 100 mL IVPB (2 g Intravenous New Bag/Given 05/20/18 0124)  sodium chloride 0.9 % bolus 1,000 mL (has no administration in time range)  sodium chloride 0.9 % bolus 1,000 mL (has no administration in time range)  acetaminophen (TYLENOL) tablet 1,000 mg (1,000 mg Oral Given 05/20/18 0057)                                                                                                                                    Procedures Procedures CRITICAL CARE Performed by: Grayce Sessions Shonda Mandarino Total critical care time: 35 minutes Critical care time was exclusive of separately billable procedures and treating other patients. Critical care was necessary to treat or prevent imminent or life-threatening deterioration. Critical care was time spent personally by me on the following activities: development of treatment plan with patient and/or surrogate as well as nursing, discussions with consultants, evaluation of patient's response to treatment, examination of patient, obtaining history from patient or surrogate, ordering and performing treatments and interventions, ordering and review of laboratory studies, ordering and review of radiographic studies, pulse oximetry and re-evaluation of patient's condition.   (including critical care time)  Medical Decision Making / ED Course I have reviewed the  nursing notes for this encounter and the patient's prior records (if available in EHR or on provided paperwork).    Patient is febrile and tachycardic with soft blood pressures with systolics in the high 50Y and low 100s.  Unable to see the urine results from the urologist office.  Septic work-up was obtained and notable for leukocytosis and elevated lactic acid.  Code sepsis was initiated and patient was started on empiric antibiotics for likely urinary source.  Lactic acid was less than 2 and blood pressures were greater than 90, thus he did not require 30 cc/kg of IV fluids.  Source confirmed to be urinary.  Will admit patient to medicine service for continued work-up and management.  Final Clinical Impression(s) / ED Diagnoses Final diagnoses:  Sepsis due to urinary tract infection (  [place])      This chart was dictated using voice recognition software.  Despite best efforts to proofread,  errors can occur which can change the documentation meaning.   Fatima Blank, MD 05/20/18 [id number]

## 2018-05-21 LAB — CBC
HEMATOCRIT: 37.7 % — AB (ref 39.0–52.0)
Hemoglobin: 12.3 g/dL — ABNORMAL LOW (ref 13.0–17.0)
MCH: 31.9 pg (ref 26.0–34.0)
MCHC: 32.6 g/dL (ref 30.0–36.0)
MCV: 97.7 fL (ref 80.0–100.0)
Platelets: 108 10*3/uL — ABNORMAL LOW (ref 150–400)
RBC: 3.86 MIL/uL — ABNORMAL LOW (ref 4.22–5.81)
RDW: 14.3 % (ref 11.5–15.5)
WBC: 13.6 10*3/uL — AB (ref 4.0–10.5)
nRBC: 0 % (ref 0.0–0.2)

## 2018-05-21 LAB — BASIC METABOLIC PANEL
Anion gap: 8 (ref 5–15)
BUN: 11 mg/dL (ref 8–23)
CALCIUM: 7.7 mg/dL — AB (ref 8.9–10.3)
CO2: 21 mmol/L — ABNORMAL LOW (ref 22–32)
CREATININE: 0.94 mg/dL (ref 0.61–1.24)
Chloride: 108 mmol/L (ref 98–111)
GFR calc non Af Amer: >60 mL/min (ref >60)
Glucose, Bld: 100 mg/dL — ABNORMAL HIGH (ref 70–99)
Potassium: 3.4 mmol/L — ABNORMAL LOW (ref 3.5–5.1)
Sodium: 137 mmol/L (ref 135–145)

## 2018-05-21 LAB — URINE CULTURE

## 2018-05-21 MED ORDER — SODIUM CHLORIDE 0.9 % IV SOLN
INTRAVENOUS | Status: DC | PRN
  Administered 2018-05-21 – 2018-05-23 (×4): 250 mL via INTRAVENOUS

## 2018-05-21 NOTE — Progress Notes (Signed)
Pt set up CPAP machine RT added sterile water to chamber. Pt stated he can place self on when ready for bed . RT will continue to monitor as needed.

## 2018-05-21 NOTE — Progress Notes (Addendum)
Report given to [place], [place]. Pt to be transported by [facility]

## 2018-05-21 NOTE — Progress Notes (Signed)
PROGRESS NOTE    Hamish Banks  ZOX:[id number] DOB: 28-Jul-1935 DOA: 05/19/2018 PCP: [name], MD  Outpatient Specialists:    Brief Narrative:  Rio Kidane is a 82 y.o. male with history of atrial fibrillation, pulmonary embolism, sleep apnea, hypertension, hyperlipidemia, history of gout, history of pacemaker placement has been experiencing weakness and increased urinary frequency last 2 days.  Patient had gone to urologist office and was prescribed Keflex which patient stated he had taken 2 doses.  The night prior to presentation, the patient was so weak and was unable to walk.  On presentation to the hospital, the patient was found to be hypotensive and initially tachycardic.  Lactate was elevated at 2.2 with WBC of 16.4.  UA is compatible with UTI.    Blood cultures is growing E. coli.   Assessment & Plan:   Principal Problem:   Sepsis ([place]) Active Problems:   Permanent atrial fibrillation   Pacemaker -single-chamber [employer] Zephyr 2010   OSA on CPAP   Essential hypertension   Severe obesity (BMI 35.0-35.9 with comorbidity) (HCC)   Paget disease, extra mammary   Severe sepsis ([place])  Sepsis likely from UTI: Continue IV Rocephin Follow final cultures Patient is improving.   Acute kidney injury: Likely secondary to volume depletion. Kidney injury has resolved.  History of atrial fibrillation: Prior to admission, patient was on both apixaban and metoprolol.  Metoprolol is currently on hold due to hypotension.  Continue to monitor the blood pressure and heart rate.   Sleep apnea: Continue CPAP at nighttime.   History of Paget's disease. Hyperlipidemia on statins.   DVT prophylaxis: Apixaban. Code Status: Full code. Family Communication: Patient's wife and daughter. Disposition Plan: Depends on Hospital course Consults called: Critical care. Admission status: Inpatient.   Procedures:   None  Antimicrobials:   IV Rocephin   Subjective: Patient  feels better today. Weakness is improving.  Objective: Vitals:   05/21/18 0500 05/21/18 0600 05/21/18 0705 05/21/18 1102  BP: (!) 98/58 108/61    Pulse: 88 70    Resp: 19 19    Temp:   98.5 F (36.9 C) 98.2 F (36.8 C)  TempSrc:   Oral Oral  SpO2: 93% 94%    Weight:      Height:        Intake/Output Summary (Last 24 hours) at 05/21/2018 1202 Last data filed at 05/21/2018 1000 Gross per 24 hour  Intake 3138.73 ml  Output 1360 ml  Net 1778.73 ml   Filed Weights   05/19/18 2307  Weight: 123.4 kg    Examination:  General exam: Appears calm and comfortable  Respiratory system: Clear to auscultation. Respiratory effort normal. Cardiovascular system: S1 & S2 heard Gastrointestinal system: Abdomen is nondistended, soft and nontender.  Central nervous system: Alert and oriented. No focal neurological deficits. Extremities: Symmetric power.  Data Reviewed: I have personally reviewed following labs and imaging studies  CBC: Recent Labs  Lab 05/19/18 2300 05/20/18 1624 05/21/18 0424  WBC 16.4* 16.1* 13.6*  NEUTROABS 14.1*  --   --   HGB 14.0 12.5* 12.3*  HCT 42.6 37.7* 37.7*  MCV 97.7 97.4 97.7  PLT 147* 124* 811*   Basic Metabolic Panel: Recent Labs  Lab 05/19/18 2300 05/20/18 1624 05/21/18 0424  NA 135 136 137  K 3.6 3.3* 3.4*  CL 107 108 108  CO2 20* 23 21*  GLUCOSE 124* 130* 100*  BUN 29* 18 11  CREATININE 1.40* 1.00 0.94  CALCIUM 8.9 7.6* 7.7*  GFR: Estimated Creatinine Clearance: 88.7 mL/min (by C-G formula based on SCr of 0.94 mg/dL). Liver Function Tests: Recent Labs  Lab 05/19/18 2300  AST 21  ALT 13  ALKPHOS 49  BILITOT 1.3*  PROT 6.0*  ALBUMIN 3.5   No results for input(s): LIPASE, AMYLASE in the last 168 hours. No results for input(s): AMMONIA in the last 168 hours. Coagulation Profile: No results for input(s): INR, PROTIME in the last 168 hours. Cardiac Enzymes: Recent Labs  Lab 05/20/18 0605  TROPONINI <0.03   BNP (last 3  results) No results for input(s): PROBNP in the last 8760 hours. HbA1C: No results for input(s): HGBA1C in the last 72 hours. CBG: Recent Labs  Lab 05/20/18 1202  GLUCAP 99   Lipid Profile: No results for input(s): CHOL, HDL, LDLCALC, TRIG, CHOLHDL, LDLDIRECT in the last 72 hours. Thyroid Function Tests: No results for input(s): TSH, T4TOTAL, FREET4, T3FREE, THYROIDAB in the last 72 hours. Anemia Panel: No results for input(s): VITAMINB12, FOLATE, FERRITIN, TIBC, IRON, RETICCTPCT in the last 72 hours. Urine analysis:    Component Value Date/Time   COLORURINE YELLOW 05/20/2018 0125   APPEARANCEUR HAZY (A) 05/20/2018 0125   LABSPEC 1.027 05/20/2018 0125   PHURINE 5.0 05/20/2018 0125   GLUCOSEU NEGATIVE 05/20/2018 0125   HGBUR MODERATE (A) 05/20/2018 0125   BILIRUBINUR NEGATIVE 05/20/2018 0125   KETONESUR NEGATIVE 05/20/2018 0125   PROTEINUR 30 (A) 05/20/2018 0125   NITRITE NEGATIVE 05/20/2018 0125   LEUKOCYTESUR MODERATE (A) 05/20/2018 0125   Sepsis Labs: @LABRCNTIP (procalcitonin:4,lacticidven:4)  ) Recent Results (from the past 240 hour(s))  Blood culture (routine x 2)     Status: Abnormal (Preliminary result)   Collection Time: 05/19/18 11:00 PM  Result Value Ref Range Status   Specimen Description BLOOD RIGHT HAND  Final   Special Requests   Final    BOTTLES DRAWN AEROBIC AND ANAEROBIC Blood Culture results may not be optimal due to an excessive volume of blood received in culture bottles   Culture  Setup Time   Final    GRAM NEGATIVE RODS IN BOTH AEROBIC AND ANAEROBIC BOTTLES CRITICAL RESULT CALLED TO, READ BACK BY AND VERIFIED WITH: [name] [facility] 05/20/18 1906 [name]    Culture (A)  Final    ESCHERICHIA COLI SUSCEPTIBILITIES TO FOLLOW Performed at [facility], [place] [address] [place], [place] [address]    Report Status PENDING  Incomplete  Blood Culture ID Panel (Reflexed)     Status: Abnormal   Collection Time: 05/19/18 11:00 PM  Result Value Ref  Range Status   Enterococcus species NOT DETECTED NOT DETECTED Final   Listeria monocytogenes NOT DETECTED NOT DETECTED Final   Staphylococcus species NOT DETECTED NOT DETECTED Final   Staphylococcus aureus (BCID) NOT DETECTED NOT DETECTED Final   Streptococcus species NOT DETECTED NOT DETECTED Final   Streptococcus agalactiae NOT DETECTED NOT DETECTED Final   Streptococcus pneumoniae NOT DETECTED NOT DETECTED Final   Streptococcus pyogenes NOT DETECTED NOT DETECTED Final   Acinetobacter baumannii NOT DETECTED NOT DETECTED Final   Enterobacteriaceae species DETECTED (A) NOT DETECTED Final    Comment: Enterobacteriaceae represent a large family of gram-negative bacteria, not a single organism. CRITICAL RESULT CALLED TO, READ BACK BY AND VERIFIED WITH: [name] 05/20/18 1906 [name]    Enterobacter cloacae complex NOT DETECTED NOT DETECTED Final   Escherichia coli DETECTED (A) NOT DETECTED Final    Comment: CRITICAL RESULT CALLED TO, READ BACK BY AND VERIFIED WITH: [name] PHARMD  05/20/18 1906 [name]    Klebsiella oxytoca NOT DETECTED NOT DETECTED Final   Klebsiella pneumoniae NOT DETECTED NOT DETECTED Final   Proteus species NOT DETECTED NOT DETECTED Final   Serratia marcescens NOT DETECTED NOT DETECTED Final   Carbapenem resistance NOT DETECTED NOT DETECTED Final   Haemophilus influenzae NOT DETECTED NOT DETECTED Final   Neisseria meningitidis NOT DETECTED NOT DETECTED Final   Pseudomonas aeruginosa NOT DETECTED NOT DETECTED Final   Candida albicans NOT DETECTED NOT DETECTED Final   Candida glabrata NOT DETECTED NOT DETECTED Final   Candida krusei NOT DETECTED NOT DETECTED Final   Candida parapsilosis NOT DETECTED NOT DETECTED Final   Candida tropicalis NOT DETECTED NOT DETECTED Final    Comment: Performed at [facility], [place] [address], [place], [place] [address]  Urine culture     Status: Abnormal   Collection Time: 05/20/18  1:26 AM  Result Value Ref Range Status    Specimen Description URINE, CLEAN CATCH  Final   Special Requests NONE  Final   Culture (A)  Final    <10,000 COLONIES/mL INSIGNIFICANT GROWTH Performed at [facility], [address] [place], [place] [address]    Report Status 05/21/2018 FINAL  Final  Blood culture (routine x 2)     Status: None (Preliminary result)   Collection Time: 05/20/18  6:05 AM  Result Value Ref Range Status   Specimen Description BLOOD RIGHT FOREARM  Final   Special Requests   Final    BOTTLES DRAWN AEROBIC AND ANAEROBIC Blood Culture adequate volume PATIENT ON FOLLOWING ROCEPHIN 2G   Culture   Final    NO GROWTH 1 DAY Performed at [facility], [place] [address], [place], [place] [address]    Report Status PENDING  Incomplete  MRSA PCR Screening     Status: None   Collection Time: 05/20/18 11:19 AM  Result Value Ref Range Status   MRSA by PCR NEGATIVE NEGATIVE Final    Comment:        The GeneXpert MRSA Assay (FDA approved for NASAL specimens only), is one component of a comprehensive MRSA colonization surveillance program. It is not intended to diagnose MRSA infection nor to guide or monitor treatment for MRSA infections. Performed at [facility], [place] [address] [place], [place] [address]          Radiology Studies: Dg Chest 2 View  Result Date: 05/20/2018 CLINICAL DATA:  Weakness and fever. EXAM: CHEST - 2 VIEW COMPARISON:  10/28/2017 FINDINGS: Left-sided pacemaker unchanged. Lungs are adequately inflated without consolidation or effusion. Mild stable cardiomegaly. Remainder of the exam is unchanged. IMPRESSION: No acute findings. Mild stable cardiomegaly. Electronically Signed   By: Marin Olp M.D.   On: 05/20/2018 01:13   Ct Renal Stone Study  Result Date: 05/20/2018 CLINICAL DATA:  Flank pain EXAM: CT ABDOMEN AND PELVIS WITHOUT CONTRAST TECHNIQUE: Multidetector CT imaging of the abdomen and pelvis was performed following the standard protocol without oral or IV  contrast. COMPARISON:  September 24, 2016 FINDINGS: Lower chest: There is atelectatic change in lung bases. Pacemaker leads are attached to the right heart. There are foci of coronary artery calcification. Hepatobiliary: No focal liver lesions are evident on this noncontrast enhanced study. Gallbladder is absent. There is no appreciable biliary duct dilatation. Pancreas: There is no evident pancreatic mass or inflammatory focus. Spleen: No splenic lesions are evident. Adrenals/Urinary Tract: Adrenals bilaterally appear unremarkable. There is a cyst arising from the medial  anterior left kidney measuring 3.8 x 3.0 cm. There is a cyst arising laterally from the upper pole of the left kidney measuring 2.1 x 1.8 cm. There is no appreciable hydronephrosis on either side. There is a 3 mm calculus in the lower pole of the left kidney. There is no evident ureteral calculus on either side. Urinary bladder is midline with wall thickness within normal limits. Stomach/Bowel: There are scattered left-sided colonic diverticula without diverticulitis. There is no appreciable bowel wall or mesenteric thickening. There is no evident bowel obstruction. There is no free air or portal venous air. Vascular/Lymphatic: There is aortoiliac atherosclerosis. No aneurysm evident. Major mesenteric arterial vessels appear patent on this noncontrast enhanced study, although there is atherosclerotic calcification in the proximal major mesenteric arterial vessels. No adenopathy is appreciable in the abdomen or pelvis. Reproductive: Prostate is prominent and contains multiple calculi. Prostate abuts the inferior aspect of the urinary bladder. There is no pelvic mass. Note that there is soft tissue stranding in the peri prostatic region which may be indicative of a degree of prostate inflammation. Seminal vesicles appear normal. There is no demonstrable pelvic mass. Other: Appendix appears normal. There is no abscess or ascites in the abdomen or pelvis.  There is a minimal ventral hernia containing only fat. Musculoskeletal: There is extensive arthropathy throughout the lumbar spine. There is moderate spinal stenosis at L3-4 and L4-5 due to calcified disc protrusions as well as bony hypertrophy. There are no blastic or lytic bone lesions. There is no intramuscular or abdominal wall lesions. IMPRESSION: 1. Prominent prostate with multiple prostatic calculi. There is soft tissue stranding in the peri prostatic region. Suspect a degree of prostatitis. No prostatic mass evident. Note that the prostate abuts the inferior aspect of the urinary bladder. These findings warrant clinical assessment as well as PSA correlation. 2. 3 mm calculus lower pole left kidney. No hydronephrosis or renal calculus on either side. The urinary bladder wall does not appear appreciably thickened. 3. No evident bowel obstruction. There are scattered left-sided colonic diverticula without diverticulitis. No evident abscess in the abdomen pelvis. Appendix appears normal. 4. Aortoiliac and proximal mesenteric arterial atherosclerosis. There are foci of coronary artery calcification. No aneurysm evident. 5. Degenerative change throughout the lumbar spine. There is moderate spinal stenosis at L3-4 and L4-5 due to calcified disc protrusions and bony hypertrophy. 6.  Small ventral hernia containing only fat. 7.  Gallbladder absent. Aortic Atherosclerosis (ICD10-I70.0). Electronically Signed   By: Lowella Grip III M.D.   On: 05/20/2018 08:04        Scheduled Meds: . apixaban  5 mg Oral BID  . atorvastatin  20 mg Oral Daily  . baclofen  10 mg Oral BID  . febuxostat  40 mg Oral Daily  . gabapentin  600 mg Oral Q breakfast   And  . gabapentin  300 mg Oral QAC supper  . lidocaine  1 patch Transdermal Q24H  . vitamin B-1  250 mg Oral Daily   Continuous Infusions: . cefTRIAXone (ROCEPHIN)  IV Stopped (05/21/18 0114)     LOS: 1 day    Time spent: [place]    Dana Allan, MD  [facility] Pager #: [phone] 7PM-7AM contact night coverage as above

## 2018-05-21 NOTE — Care Management Note (Signed)
Case Management Note Marvetta Gibbons RN,BSN Transitions of Care Unit 50M - RN Case Manager [phone]  Patient Details  Name: Kingdavid Leinbach MRN: [id number] Date of Birth: Dec 05, 1935  Subjective/Objective:   Pt admitted with sepsis from UTI.                  Action/Plan: PTA pt lived at home with spouse, has daughter also nearby. Pt was independent with ADLs, used cane. Requesting PT eval for recommendations. CM will follow for transition of care needs.   Expected Discharge Date:                  Expected Discharge Plan:     In-House Referral:     Discharge planning Services  CM Consult  Post Acute Care Choice:    Choice offered to:     DME Arranged:    DME Agency:     HH Arranged:    HH Agency:     Status of Service:  In process, will continue to follow  If discussed at Long Length of Stay Meetings, dates discussed:    Discharge Disposition:   Additional Comments:  Dawayne Patricia, RN 05/21/2018, 11:52 AM

## 2018-05-21 NOTE — Evaluation (Signed)
Physical Therapy Evaluation Patient Details Name: Cameron Sharp MRN: [id number] DOB: 01-15-1936 Today's Date: 05/21/2018   History of Present Illness  Pt is an 82 y.o. male admitted 05/19/18 with weakness and urinary frequency. Found to be hypotensive and tachycardic. Worked up for sepsis likely form UTI. PMH includes pacemaker, HTN, OSA on CPAP, Paget's disease, HLD.     Clinical Impression  Pt presents with an overall decrease in functional mobility secondary to above. PTA, pt indep with intermittent use of SPC; lives with wife available for supervision, but not necessarily able to provide physical assist. Today, pt required min guard to minA for standing and walking short distance with RW. Limited by pain, fatigue, decreased activity tolerance and DOE. Hopeful pt will progress well with mobility. Pt would benefit from continued acute PT services to maximize functional mobility and independence prior to d/c with SNF-level therapies.     Follow Up Recommendations SNF;Supervision for mobility/OOB(pending pt progression)    Equipment Recommendations  (TBD)    Recommendations for Other Services       Precautions / Restrictions Precautions Precautions: Fall Restrictions Weight Bearing Restrictions: No      Mobility  Bed Mobility Overal bed mobility: Needs Assistance Bed Mobility: Supine to Sit;Sit to Supine     Supine to sit: Mod assist;HOB elevated Sit to supine: Min assist;HOB elevated   General bed mobility comments: ModA for UE support to assist trunk elevation to EOB. MinA to assist BLEs into supine  Transfers Overall transfer level: Needs assistance Equipment used: Rolling walker (2 wheeled) Transfers: Sit to/from Stand Sit to Stand: Min assist;From elevated surface         General transfer comment: MinA to assist trunk elevation from elevated bed height; poor eccentric control into sitting  Ambulation/Gait Ambulation/Gait assistance: Min guard Gait Distance  (Feet): 10 Feet Assistive device: Rolling walker (2 wheeled) Gait Pattern/deviations: Step-to pattern;Trunk flexed Gait velocity: Decreased Gait velocity interpretation: <1.31 ft/sec, indicative of household ambulator General Gait Details: Slow, antalgic amb with RW and close min guard for balance; c/o BLE instability  Stairs            Wheelchair Mobility    Modified Rankin (Stroke Patients Only)       Balance Overall balance assessment: Needs assistance   Sitting balance-Leahy Scale: Fair       Standing balance-Leahy Scale: Poor Standing balance comment: Reliant on UE support                             Pertinent Vitals/Pain Pain Assessment: Faces Faces Pain Scale: Hurts little more Pain Location: Scrotal swelling; chronic back pain Pain Descriptors / Indicators: Discomfort;Grimacing Pain Intervention(s): Monitored during session;Repositioned    Home Living Family/patient expects to be discharged to:: Private residence Living Arrangements: Spouse/significant other Available Help at Discharge: Family;Available 24 hours/day Type of Home: Apartment Home Access: Level entry     Home Layout: One level Home Equipment: Walker - 2 wheels;Cane - quad Additional Comments: Reports wife is 5'3" with asthma and back issues    Prior Function Level of Independence: Independent         Comments: Remains active; chronic back pain. Reports fall during events leading to admission secondary to BLE weakness     Hand Dominance        Extremity/Trunk Assessment   Upper Extremity Assessment Upper Extremity Assessment: Overall WFL for tasks assessed    Lower Extremity Assessment Lower Extremity Assessment: RLE deficits/detail;LLE deficits/detail  RLE Deficits / Details: Hip and knee grossly 4/5 throughout. Ankle DF/PF 3/5 LLE Deficits / Details: Hip and knee grossly 4/5 throughout. Ankle DF 2/5 (pt reports chronic foot drop), PF 3/5    Cervical / Trunk  Assessment Cervical / Trunk Assessment: Kyphotic  Communication   Communication: No difficulties  Cognition Arousal/Alertness: Awake/alert Behavior During Therapy: WFL for tasks assessed/performed Overall Cognitive Status: Within Functional Limits for tasks assessed                                 General Comments: Tangential with speech, but able to be redirected      General Comments      Exercises     Assessment/Plan    PT Assessment Patient needs continued PT services  PT Problem List Decreased strength;Decreased activity tolerance;Decreased balance;Decreased mobility;Decreased knowledge of use of DME;Cardiopulmonary status limiting activity       PT Treatment Interventions DME instruction;Gait training;Stair training;Functional mobility training;Therapeutic activities;Therapeutic exercise;Balance training;Patient/family education    PT Goals (Current goals can be found in the Care Plan section)  Acute Rehab PT Goals Patient Stated Goal: Return straight home if possible PT Goal Formulation: With patient Time For Goal Achievement: 06/04/18 Potential to Achieve Goals: Fair    Frequency Min 3X/week   Barriers to discharge Decreased caregiver support      Co-evaluation               AM-PAC PT "6 Clicks" Daily Activity  Outcome Measure Difficulty turning over in bed (including adjusting bedclothes, sheets and blankets)?: Unable Difficulty moving from lying on back to sitting on the side of the bed? : Unable Difficulty sitting down on and standing up from a chair with arms (e.g., wheelchair, bedside commode, etc,.)?: Unable Help needed moving to and from a bed to chair (including a wheelchair)?: A Little Help needed walking in hospital room?: A Little Help needed climbing 3-5 steps with a railing? : A Lot 6 Click Score: 11    End of Session Equipment Utilized During Treatment: Gait belt Activity Tolerance: Patient tolerated treatment  well;Patient limited by fatigue Patient left: in bed;with call bell/phone within reach Nurse Communication: Mobility status PT Visit Diagnosis: Other abnormalities of gait and mobility (R26.89);Muscle weakness (generalized) (M62.81)    Time: 3244-0102 PT Time Calculation (min) (ACUTE ONLY): 27 min   Charges:   PT Evaluation $PT Eval Moderate Complexity: 1 Mod PT Treatments $Therapeutic Activity: 8-22 mins        Mabeline Caras, PT, DPT Acute Rehabilitation Services  Pager [phone] Office [place] 05/21/2018, 5:39 PM

## 2018-05-22 DIAGNOSIS — A4151 Sepsis due to Escherichia coli [E. coli]: Principal | ICD-10-CM

## 2018-05-22 DIAGNOSIS — I10 Essential (primary) hypertension: Secondary | ICD-10-CM

## 2018-05-22 DIAGNOSIS — R7881 Bacteremia: Secondary | ICD-10-CM

## 2018-05-22 LAB — BASIC METABOLIC PANEL
Anion gap: 6 (ref 5–15)
BUN: 10 mg/dL (ref 8–23)
CHLORIDE: 109 mmol/L (ref 98–111)
CO2: 21 mmol/L — AB (ref 22–32)
CREATININE: 1 mg/dL (ref 0.61–1.24)
Calcium: 7.9 mg/dL — ABNORMAL LOW (ref 8.9–10.3)
GFR calc Af Amer: >60 mL/min (ref >60)
GFR calc non Af Amer: >60 mL/min (ref >60)
GLUCOSE: 115 mg/dL — AB (ref 70–99)
Potassium: 3.3 mmol/L — ABNORMAL LOW (ref 3.5–5.1)
Sodium: 136 mmol/L (ref 135–145)

## 2018-05-22 LAB — CULTURE, BLOOD (ROUTINE X 2)

## 2018-05-22 LAB — CBC
HEMATOCRIT: 38.1 % — AB (ref 39.0–52.0)
HEMOGLOBIN: 12.6 g/dL — AB (ref 13.0–17.0)
MCH: 31.7 pg (ref 26.0–34.0)
MCHC: 33.1 g/dL (ref 30.0–36.0)
MCV: 95.7 fL (ref 80.0–100.0)
Platelets: 116 10*3/uL — ABNORMAL LOW (ref 150–400)
RBC: 3.98 MIL/uL — ABNORMAL LOW (ref 4.22–5.81)
RDW: 14.1 % (ref 11.5–15.5)
WBC: 8.4 10*3/uL (ref 4.0–10.5)
nRBC: 0 % (ref 0.0–0.2)

## 2018-05-22 MED ORDER — POTASSIUM CHLORIDE CRYS ER 20 MEQ PO TBCR
40.0000 meq | EXTENDED_RELEASE_TABLET | Freq: Once | ORAL | Status: AC
  Administered 2018-05-22: 40 meq via ORAL
  Filled 2018-05-22: qty 2

## 2018-05-22 MED ORDER — CEFAZOLIN SODIUM-DEXTROSE 2-4 GM/100ML-% IV SOLN
2.0000 g | Freq: Three times a day (TID) | INTRAVENOUS | Status: DC
  Administered 2018-05-22 – 2018-05-24 (×6): 2 g via INTRAVENOUS
  Filled 2018-05-22 (×7): qty 100

## 2018-05-22 MED ORDER — METOPROLOL TARTRATE 25 MG PO TABS
25.0000 mg | ORAL_TABLET | Freq: Two times a day (BID) | ORAL | Status: DC
  Administered 2018-05-22 – 2018-05-25 (×7): 25 mg via ORAL
  Filled 2018-05-22 (×7): qty 1

## 2018-05-22 NOTE — Progress Notes (Signed)
[facility] PROGRESS NOTE  Cameron Sharp IFO:[id number] DOB: [birth year] DOA: 05/19/2018  PCP: [name], MD  Brief History/Interval Summary: 82 y.o.malewithhistory of atrial fibrillation, pulmonary embolism, sleep apnea, hypertension, hyperlipidemia, history of gout, history of pacemaker placement has been experiencing weakness and increased urinary frequency last 2 days. Patient had gone to urologist office and was prescribed Keflex which patient stated he had taken 2 doses. The night prior to presentation, the patient was so weak and was unable to walk.  On presentation to the hospital, the patient was found to be hypotensive and initially tachycardic. Lactate was elevated at 2.2 with WBC of 16.4. UA was compatible with UTI.   Blood cultures is growing E. coli.  Reason for Visit: E. coli bacteremia  Consultants: None  Procedures: None  Antibiotics: Ceftriaxone  Subjective/Interval History: Patient states that he feels better.  Still fatigued but stronger than yesterday.  Denies any abdominal pain.  No nausea or vomiting.  ROS: No chest pain or shortness of breath  Objective:  Vital Signs  Vitals:   05/21/18 2100 05/22/18 0038 05/22/18 0548 05/22/18 0553  BP:  120/65  126/66  Pulse:  78  93  Resp:  18  20  Temp: 100.1 F (37.8 C) 99.6 F (37.6 C)  99.4 F (37.4 C)  TempSrc: Oral Oral  Oral  SpO2:  99%  95%  Weight:   (!) 136.2 kg   Height:        Intake/Output Summary (Last 24 hours) at 05/22/2018 1153 Last data filed at 05/22/2018 0549 Gross per 24 hour  Intake 128.84 ml  Output 1126 ml  Net -997.16 ml   Filed Weights   05/19/18 2307 05/21/18 1520 05/22/18 0548  Weight: 123.4 kg 126.6 kg (!) 136.2 kg    General appearance: alert, cooperative, appears stated age and no distress Head: Normocephalic, without obvious abnormality, atraumatic Resp: clear to auscultation bilaterally Cardio: regular rate and rhythm, S1, S2 normal, no murmur,  click, rub or gallop GI: soft, non-tender; bowel sounds normal; no masses,  no organomegaly Extremities: Edema with chronic venous disease noted in both legs Pulses: 2+ and symmetric Neurologic: Alert and oriented x3.  No focal neurological deficits.  Lab Results:  Data Reviewed: I have personally reviewed following labs and imaging studies  CBC: Recent Labs  Lab 05/19/18 2300 05/20/18 1624 05/21/18 0424 05/22/18 0755  WBC 16.4* 16.1* 13.6* 8.4  NEUTROABS 14.1*  --   --   --   HGB 14.0 12.5* 12.3* 12.6*  HCT 42.6 37.7* 37.7* 38.1*  MCV 97.7 97.4 97.7 95.7  PLT 147* 124* 108* 116*    Basic Metabolic Panel: Recent Labs  Lab 05/19/18 2300 05/20/18 1624 05/21/18 0424 05/22/18 0755  NA 135 136 137 136  K 3.6 3.3* 3.4* 3.3*  CL 107 108 108 109  CO2 20* 23 21* 21*  GLUCOSE 124* 130* 100* 115*  BUN 29* 18 11 10   CREATININE 1.40* 1.00 0.94 1.00  CALCIUM 8.9 7.6* 7.7* 7.9*    GFR: Estimated Creatinine Clearance: 87.7 mL/min (by C-G formula based on SCr of 1 mg/dL).  Liver Function Tests: Recent Labs  Lab 05/19/18 2300  AST 21  ALT 13  ALKPHOS 49  BILITOT 1.3*  PROT 6.0*  ALBUMIN 3.5    Cardiac Enzymes: Recent Labs  Lab 05/20/18 0605  TROPONINI <0.03    CBG: Recent Labs  Lab 05/20/18 1202  GLUCAP 99     Recent Results (from the past 240 hour(s))  Blood  culture (routine x 2)     Status: Abnormal   Collection Time: 05/19/18 11:00 PM  Result Value Ref Range Status   Specimen Description BLOOD RIGHT HAND  Final   Special Requests   Final    BOTTLES DRAWN AEROBIC AND ANAEROBIC Blood Culture results may not be optimal due to an excessive volume of blood received in culture bottles   Culture  Setup Time   Final    GRAM NEGATIVE RODS IN BOTH AEROBIC AND ANAEROBIC BOTTLES CRITICAL RESULT CALLED TO, READ BACK BY AND VERIFIED WITHKarlene Einstein [facility] 05/20/18 1906 [name] Performed at [facility], [address] [place], [place] [address]    Culture  ESCHERICHIA COLI (A)  Final   Report Status 05/22/2018 FINAL  Final   Organism ID, Bacteria ESCHERICHIA COLI  Final      Susceptibility   Escherichia coli - MIC*    AMPICILLIN 4 SENSITIVE Sensitive     CEFAZOLIN <=4 SENSITIVE Sensitive     CEFEPIME <=1 SENSITIVE Sensitive     CEFTAZIDIME <=1 SENSITIVE Sensitive     CEFTRIAXONE <=1 SENSITIVE Sensitive     CIPROFLOXACIN <=0.25 SENSITIVE Sensitive     GENTAMICIN <=1 SENSITIVE Sensitive     IMIPENEM <=0.25 SENSITIVE Sensitive     TRIMETH/SULFA <=20 SENSITIVE Sensitive     AMPICILLIN/SULBACTAM <=2 SENSITIVE Sensitive     PIP/TAZO <=4 SENSITIVE Sensitive     Extended ESBL NEGATIVE Sensitive     * ESCHERICHIA COLI  Blood Culture ID Panel (Reflexed)     Status: Abnormal   Collection Time: 05/19/18 11:00 PM  Result Value Ref Range Status   Enterococcus species NOT DETECTED NOT DETECTED Final   Listeria monocytogenes NOT DETECTED NOT DETECTED Final   Staphylococcus species NOT DETECTED NOT DETECTED Final   Staphylococcus aureus (BCID) NOT DETECTED NOT DETECTED Final   Streptococcus species NOT DETECTED NOT DETECTED Final   Streptococcus agalactiae NOT DETECTED NOT DETECTED Final   Streptococcus pneumoniae NOT DETECTED NOT DETECTED Final   Streptococcus pyogenes NOT DETECTED NOT DETECTED Final   Acinetobacter baumannii NOT DETECTED NOT DETECTED Final   Enterobacteriaceae species DETECTED (A) NOT DETECTED Final    Comment: Enterobacteriaceae represent a large family of gram-negative bacteria, not a single organism. CRITICAL RESULT CALLED TO, READ BACK BY AND VERIFIED WITH: [name] 05/20/18 1906 [name]    Enterobacter cloacae complex NOT DETECTED NOT DETECTED Final   Escherichia coli DETECTED (A) NOT DETECTED Final    Comment: CRITICAL RESULT CALLED TO, READ BACK BY AND VERIFIED WITH: [name] PHARMD 05/20/18 1906 [name]    Klebsiella oxytoca NOT DETECTED NOT DETECTED Final   Klebsiella pneumoniae NOT DETECTED NOT DETECTED Final    Proteus species NOT DETECTED NOT DETECTED Final   Serratia marcescens NOT DETECTED NOT DETECTED Final   Carbapenem resistance NOT DETECTED NOT DETECTED Final   Haemophilus influenzae NOT DETECTED NOT DETECTED Final   Neisseria meningitidis NOT DETECTED NOT DETECTED Final   Pseudomonas aeruginosa NOT DETECTED NOT DETECTED Final   Candida albicans NOT DETECTED NOT DETECTED Final   Candida glabrata NOT DETECTED NOT DETECTED Final   Candida krusei NOT DETECTED NOT DETECTED Final   Candida parapsilosis NOT DETECTED NOT DETECTED Final   Candida tropicalis NOT DETECTED NOT DETECTED Final    Comment: Performed at [facility], [place] [address] [place], [place] [address]  Urine culture     Status: Abnormal   Collection Time: 05/20/18  1:26 AM  Result Value  Ref Range Status   Specimen Description URINE, CLEAN CATCH  Final   Special Requests NONE  Final   Culture (A)  Final    <10,000 COLONIES/mL INSIGNIFICANT GROWTH Performed at [facility], [address], [place], [place] [address]    Report Status 05/21/2018 FINAL  Final  Blood culture (routine x 2)     Status: None (Preliminary result)   Collection Time: 05/20/18  6:05 AM  Result Value Ref Range Status   Specimen Description BLOOD RIGHT FOREARM  Final   Special Requests   Final    BOTTLES DRAWN AEROBIC AND ANAEROBIC Blood Culture adequate volume PATIENT ON FOLLOWING ROCEPHIN 2G   Culture   Final    NO GROWTH 1 DAY Performed at [facility], [place] [address], [place], [place] [address]    Report Status PENDING  Incomplete  MRSA PCR Screening     Status: None   Collection Time: 05/20/18 11:19 AM  Result Value Ref Range Status   MRSA by PCR NEGATIVE NEGATIVE Final    Comment:        The GeneXpert MRSA Assay (FDA approved for NASAL specimens only), is one component of a comprehensive MRSA colonization surveillance program. It is not intended to diagnose MRSA infection nor to guide or monitor treatment for MRSA  infections. Performed at [facility], [place] [address] [place], [place] [address]       Radiology Studies: No results found.   Medications:  Scheduled: . apixaban  5 mg Oral BID  . atorvastatin  20 mg Oral Daily  . baclofen  10 mg Oral BID  . febuxostat  40 mg Oral Daily  . gabapentin  600 mg Oral Q breakfast   And  . gabapentin  300 mg Oral QAC supper  . lidocaine  1 patch Transdermal Q24H  . vitamin B-1  250 mg Oral Daily   Continuous: . sodium chloride Stopped (05/22/18 0314)  . cefTRIAXone (ROCEPHIN)  IV Stopped (05/22/18 0021)   WNI:OEVOJJ chloride, acetaminophen **OR** acetaminophen, ondansetron **OR** ondansetron (ZOFRAN) IV, traMADol  Assessment/Plan:    Sepsis secondary to E. coli bacteremia and possible UTI Sepsis physiology appears to have improved.  Lactic acid level improved to 1.7.  WBC is normal today.  Still having low-grade fevers.  Continue antibiotics intravenously for now.  E. coli bacteremia Most likely from a GU source.  Sensitivities reviewed.  Change to cefazolin.  Acute kidney injury and hypokalemia Renal function is now normal.  Replace potassium.  CT scan did show a 3 mm calculus in the left kidney but without any hydronephrosis.  Prominent prostate with multiple prostatic calculi were seen.  Degree of prostatitis was suspected.  Patient will need outpatient follow-up with urology.  History of paroxysmal atrial fibrillation Anticoagulated with apixaban which is being continued.  Was on metoprolol at home which was held at the time of admission due to hypotension.  Since vital signs have improved we will resume metoprolol.  Essential hypertension Patient on Diovan-HCT as home.  This was held.  Blood pressure is stable.  Continue to monitor.  History of obstructive sleep apnea CPAP  Normocytic anemia and mild thrombocytopenia Hemoglobin and platelet counts are both stable.  No evidence of overt bleeding.  Other incidental findings on  CT renal study Scattered diverticulosis was seen.  Evidence for atherosclerosis.  Degenerative changes in the spine was also noted.  Small ventral hernia was seen.  These can be addressed in the outpatient setting.  Hyperlipidemia Continue statins.  History of Paget's disease Outpatient follow-up with PCP  DVT Prophylaxis: On apixaban    Code Status: Full code Family Communication: Discussed with the patient.  No family at bedside Disposition Plan: PT and OT evaluation.  De-escalate antibiotics.    LOS: 2 days   [facility] Pager [phone] 05/22/2018, 11:53 AM  If 7PM-7AM, please contact night-coverage at [email or web address], password [facility]

## 2018-05-22 NOTE — Evaluation (Signed)
Occupational Therapy Evaluation Patient Details Name: Cameron Sharp MRN: [id number] DOB: May 12, 1936 Today's Date: 05/22/2018    History of Present Illness Pt is an 82 y.o. male admitted 05/19/18 with weakness and urinary frequency. Found to be hypotensive and tachycardic. Worked up for sepsis likely form UTI. PMH includes pacemaker, HTN, OSA on CPAP, Paget's disease, HLD.    Clinical Impression   Pt was modified independent with extended time for mobility and ADL prior to admission. Pt has very specific techniques he employed in order to care for himself. Pt is a high fall risk with generalized weakness and decreased activity tolerance. Recommending ST rehab in SNF prior to return home. Will follow acutely.    Follow Up Recommendations  SNF    Equipment Recommendations  3 in 1 bedside commode    Recommendations for Other Services       Precautions / Restrictions Precautions Precautions: Fall Restrictions Weight Bearing Restrictions: No      Mobility Bed Mobility Overal bed mobility: Needs Assistance Bed Mobility: Rolling;Sidelying to Sit Rolling: Min guard Sidelying to sit: Min guard      General bed mobility comments: encouraged log roll technique to protect back, used rail, HOB elevated  Transfers Overall transfer level: Needs assistance Equipment used: Rolling walker (2 wheeled) Transfers: Sit to/from Stand Sit to Stand: Min assist;From elevated surface         General transfer comment: MinA to assist trunk elevation from elevated bed height; poor eccentric control into sitting    Balance Overall balance assessment: Needs assistance   Sitting balance-Leahy Scale: Fair       Standing balance-Leahy Scale: Poor Standing balance comment: Reliant on UE support                           ADL either performed or assessed with clinical judgement   ADL Overall ADL's : Needs assistance/impaired Eating/Feeding: Independent;Sitting   Grooming:  Oral care;Set up;Sitting;Wash/dry hands;Wash/dry face Grooming Details (indicate cue type and reason): pt typically stabilizes his legs on the cabinetry when standing at sink at home Upper Body Bathing: Minimal assistance;Sitting   Lower Body Bathing: Sit to/from stand;Moderate assistance   Upper Body Dressing : Minimal assistance;Sitting   Lower Body Dressing: Moderate assistance;Sit to/from stand   Toilet Transfer: Minimal assistance;Ambulation;RW;BSC(over toilet)   Toileting- Clothing Manipulation and Hygiene: Minimal assistance;Sit to/from stand       Functional mobility during ADLs: Minimal assistance;Rolling walker General ADL Comments: pt has very specific techniques he uses when performing ADL at home, stands to shower     Vision Baseline Vision/History: Wears glasses Wears Glasses: Reading only Patient Visual Report: No change from baseline       Perception     Praxis      Pertinent Vitals/Pain Pain Assessment: Faces Faces Pain Scale: Hurts a little bit Pain Location: Scrotal swelling; chronic back pain Pain Descriptors / Indicators: Discomfort;Grimacing Pain Intervention(s): Repositioned     Hand Dominance Right   Extremity/Trunk Assessment Upper Extremity Assessment Upper Extremity Assessment: Overall WFL for tasks assessed   Lower Extremity Assessment Lower Extremity Assessment: Defer to PT evaluation   Cervical / Trunk Assessment Cervical / Trunk Assessment: Kyphotic   Communication Communication Communication: No difficulties   Cognition Arousal/Alertness: Awake/alert Behavior During Therapy: WFL for tasks assessed/performed Overall Cognitive Status: No family/caregiver present to determine baseline cognitive functioning Area of Impairment: Memory;Attention;Safety/judgement  Current Attention Level: Sustained;Selective Memory: Decreased short-term memory   Safety/Judgement: Decreased awareness of deficits      General Comments: Difficulty with multitasking; tangential with speech, and pausing mobility whenever he talks/answers questions. Likely baseline cognition   General Comments       Exercises     Shoulder Instructions      Home Living Family/patient expects to be discharged to:: Private residence Living Arrangements: Spouse/significant other Available Help at Discharge: Family;Available 24 hours/day Type of Home: Apartment Home Access: Level entry     Home Layout: One level     Bathroom Shower/Tub: Teacher, early years/pre: Handicapped height     Home Equipment: Environmental consultant - 2 wheels;Cane - quad;Grab bars - tub/shower   Additional Comments: Reports wife is 5'3" with asthma and back issues      Prior Functioning/Environment Level of Independence: Independent with assistive device(s)        Comments: Remains active; chronic back pain. Reports fall during events leading to admission secondary to BLE weakness        OT Problem List:        OT Treatment/Interventions: Self-care/ADL training;DME and/or AE instruction;Patient/family education    OT Goals(Current goals can be found in the care plan section) Acute Rehab OT Goals Patient Stated Goal: Agreeable to rehab prior to return home OT Goal Formulation: With patient Time For Goal Achievement: 05/29/18 Potential to Achieve Goals: Good ADL Goals Pt Will Perform Lower Body Bathing: with supervision;with adaptive equipment;sit to/from stand Pt Will Perform Lower Body Dressing: with supervision;with adaptive equipment;sit to/from stand Pt Will Transfer to Toilet: with supervision;ambulating;bedside commode(over toilet) Pt Will Perform Toileting - Clothing Manipulation and hygiene: with supervision;sit to/from stand Pt Will Perform Tub/Shower Transfer: with min assist;ambulating;tub bench;rolling walker  OT Frequency: Min 2X/week   Barriers to D/C: Decreased caregiver support          Co-evaluation               AM-PAC PT "6 Clicks" Daily Activity     Outcome Measure Help from another person eating meals?: None Help from another person taking care of personal grooming?: None Help from another person toileting, which includes using toliet, bedpan, or urinal?: A Little Help from another person bathing (including washing, rinsing, drying)?: A Lot Help from another person to put on and taking off regular upper body clothing?: A Little Help from another person to put on and taking off regular lower body clothing?: A Lot 6 Click Score: 18   End of Session Equipment Utilized During Treatment: Gait belt;Rolling walker  Activity Tolerance: Patient tolerated treatment well Patient left: in chair;with call bell/phone within reach;with chair alarm set  OT Visit Diagnosis: Unsteadiness on feet (R26.81);Other abnormalities of gait and mobility (R26.89);Pain;Muscle weakness (generalized) (M62.81);Other symptoms and signs involving cognitive function                Time: 7253-6644 OT Time Calculation (min): 22 min Charges:  OT General Charges $OT Visit: 1 Visit OT Evaluation $OT Eval Moderate Complexity: 1 Mod  Nestor Lewandowsky, OTR/L Acute Rehabilitation Services Pager: [phone] Office: [phone]  Malka So 05/22/2018, 10:52 AM

## 2018-05-22 NOTE — Progress Notes (Signed)
Physical Therapy Treatment Patient Details Name: Cameron Sharp MRN: [id number] DOB: [birth year] Today's Date: 05/22/2018    History of Present Illness Pt is an 82 y.o. male admitted 05/19/18 with weakness and urinary frequency. Found to be hypotensive and tachycardic. Worked up for sepsis likely form UTI. PMH includes pacemaker, HTN, OSA on CPAP, Paget's disease, HLD.    PT Comments    Pt motivated to participate with therapy. Requires min-modA for bed mobility and transfers; able to ambulate short distance with RW and close min guard for balance. Pt easily fatigued with c/o BLE instability and weakness; at significant increased risk for falls. Discussed recommendation for short-term SNF level therapies; pt in agreement with this. Will continue to follow acutely.   Follow Up Recommendations  SNF;Supervision for mobility/OOB     Equipment Recommendations  (TBD)    Recommendations for Other Services       Precautions / Restrictions Precautions Precautions: Fall Restrictions Weight Bearing Restrictions: No    Mobility  Bed Mobility Overal bed mobility: Needs Assistance Bed Mobility: Supine to Sit     Supine to sit: Mod assist;HOB elevated        Transfers Overall transfer level: Needs assistance Equipment used: Rolling walker (2 wheeled) Transfers: Sit to/from Stand Sit to Stand: Min assist;From elevated surface            Ambulation/Gait Ambulation/Gait assistance: Min guard Gait Distance (Feet): 60 Feet Assistive device: Rolling walker (2 wheeled) Gait Pattern/deviations: Step-to pattern;Trunk flexed Gait velocity: Decreased Gait velocity interpretation: <1.31 ft/sec, indicative of household ambulator General Gait Details: Slow, antalgic amb with RW and close min guard for balance; c/o BLE instability. Easily fatigued   Stairs             Wheelchair Mobility    Modified Rankin (Stroke Patients Only)       Balance Overall balance assessment:  Needs assistance   Sitting balance-Leahy Scale: Fair       Standing balance-Leahy Scale: Poor Standing balance comment: Reliant on UE support                            Cognition Arousal/Alertness: Awake/alert Behavior During Therapy: WFL for tasks assessed/performed Overall Cognitive Status: No family/caregiver present to determine baseline cognitive functioning Area of Impairment: Memory;Attention;Safety/judgement                   Current Attention Level: Sustained;Selective Memory: Decreased short-term memory   Safety/Judgement: Decreased awareness of deficits     General Comments: Difficulty with multitasking; tangential with speech, and pausing mobility whenever he talks/answers questions. Likely baseline cognition      Exercises      General Comments        Pertinent Vitals/Pain Pain Assessment: Faces Faces Pain Scale: Hurts a little bit Pain Location: Scrotal swelling; chronic back pain Pain Descriptors / Indicators: Discomfort;Grimacing Pain Intervention(s): Monitored during session;Repositioned    Home Living                      Prior Function            PT Goals (current goals can now be found in the care plan section) Acute Rehab PT Goals Patient Stated Goal: Agreeable to rehab prior to return home PT Goal Formulation: With patient Time For Goal Achievement: 06/04/18 Potential to Achieve Goals: Good Progress towards PT goals: Progressing toward goals    Frequency    Min 3X/week  PT Plan Current plan remains appropriate    Co-evaluation              AM-PAC PT "6 Clicks" Daily Activity  Outcome Measure  Difficulty turning over in bed (including adjusting bedclothes, sheets and blankets)?: Unable Difficulty moving from lying on back to sitting on the side of the bed? : Unable Difficulty sitting down on and standing up from a chair with arms (e.g., wheelchair, bedside commode, etc,.)?: Unable Help  needed moving to and from a bed to chair (including a wheelchair)?: A Little Help needed walking in hospital room?: A Little Help needed climbing 3-5 steps with a railing? : A Lot 6 Click Score: 11    End of Session Equipment Utilized During Treatment: Gait belt Activity Tolerance: Patient tolerated treatment well;Patient limited by fatigue Patient left: in chair;with call bell/phone within reach;with chair alarm set Nurse Communication: Mobility status PT Visit Diagnosis: Other abnormalities of gait and mobility (R26.89);Muscle weakness (generalized) (M62.81)     Time: 3343-5686 PT Time Calculation (min) (ACUTE ONLY): 12 min  Charges:  $Gait Training: 8-22 mins                     Mabeline Caras, PT, DPT Acute Rehabilitation Services  Pager [phone] Office [place] 05/22/2018, 10:30 AM

## 2018-05-22 NOTE — NC FL2 (Signed)
[place] LEVEL OF CARE SCREENING TOOL     IDENTIFICATION  Patient Name: Cameron Sharp Birthdate: 1936/07/06 Sex: male Admission Date (Current Location): 05/19/2018  [facility] and [place] Number:  Herbalist and Address:  [facility] [place]. [facility], [place] [address] [place], [place] [address]      Provider Number: [id number]  Attending Physician Name and Address:  Bonnielee Haff, MD  Relative Name and Phone Number:  Temesgen Weightman [phone]    Current Level of Care: Hospital Recommended Level of Care: [place] Prior Approval Number:    Date Approved/Denied:   PASRR Number: [id number]  Discharge Plan: SNF    Current Diagnoses: Patient Active Problem List   Diagnosis Date Noted  . Sepsis ([place]) 05/20/2018  . Severe sepsis ([place]) 05/20/2018  . Acute cystitis without hematuria   . AKI (acute kidney injury) ([place])   . Hemoptysis 11/07/2017  . Paget disease of bone   . Atrial fibrillation, chronic   . Paget disease, extra mammary 05/30/2017  . Varicose veins of leg with pain 06/07/2015  . Venous insufficiency of right leg 05/07/2015  . Morbid obesity ([place]) 11/13/2014  . Degenerative disc disease 11/29/2013  . Cellulitis of left leg 07/24/2013  . Pacemaker -Webb 2010 05/01/2013  . Hyperlipidemia 05/01/2013  . OSA on CPAP 05/01/2013  . Essential hypertension 05/01/2013  . Severe obesity (BMI 35.0-35.9 with comorbidity) ([place]) 05/01/2013  . Diarrhea 05/01/2013  . Permanent atrial fibrillation 10/10/2012    Orientation RESPIRATION BLADDER Height & Weight     Self, Time, Situation, Place  Other (Comment)(OSA on CPAP (wears CPAP nightly)) Continent Weight: (!) 300 lb 3.2 oz (136.2 kg) Height:  6\' 3"  (190.5 cm)  BEHAVIORAL SYMPTOMS/MOOD NEUROLOGICAL BOWEL NUTRITION STATUS      Continent Diet(Heart healthy diet)  AMBULATORY STATUS COMMUNICATION OF NEEDS Skin   Limited Assist(Min guard)  Verbally Other (Comment)(Ecchymosis: buttocks, sacrum, scrotum (foam dressing))                       Personal Care Assistance Level of Assistance  Bathing, Feeding, Dressing Bathing Assistance: Limited assistance(Upper: min assist  Lower: mod assist) Feeding assistance: Independent Dressing Assistance: Limited assistance(Upper: min assist  Lower: mod assist)     Functional Limitations Info  Sight, Hearing, Speech Sight Info: Adequate Hearing Info: Adequate Speech Info: Adequate    SPECIAL CARE FACTORS FREQUENCY  PT (By licensed PT), OT (By licensed OT)     PT Frequency: PT at SNF evaluate and treat OT Frequency: OT at SNF evaluate and treat            Contractures Contractures Info: Not present    Additional Factors Info  Code Status, Allergies Code Status Info: Full code Allergies Info: No known allergies           Current Medications (05/22/2018):  This is the current hospital active medication list Current Facility-Administered Medications  Medication Dose Route Frequency Provider Last Rate Last Dose  . 0.9 %  sodium chloride infusion   Intravenous PRN Bonnell Public, MD   Stopped at 05/22/18 [phone]  . acetaminophen (TYLENOL) tablet 650 mg  650 mg Oral Q6H PRN Rise Patience, MD       Or  . acetaminophen (TYLENOL) suppository 650 mg  650 mg Rectal Q6H PRN Rise Patience, MD      . apixaban (ELIQUIS) tablet 5 mg  5 mg Oral BID Aldean Jewett, MD  5 mg at 05/22/18 0953  . atorvastatin (LIPITOR) tablet 20 mg  20 mg Oral Daily Rise Patience, MD   20 mg at 05/22/18 0953  . baclofen (LIORESAL) tablet 10 mg  10 mg Oral BID Rise Patience, MD   10 mg at 05/22/18 0953  . ceFAZolin (ANCEF) IVPB 2g/100 mL premix  2 g Intravenous Q8H Bonnielee Haff, MD 200 mL/hr at 05/22/18 1444 2 g at 05/22/18 1444  . febuxostat (ULORIC) tablet 40 mg  40 mg Oral Daily Rise Patience, MD   40 mg at 05/22/18 0953  . gabapentin (NEURONTIN) capsule 600  mg  600 mg Oral Q breakfast Rise Patience, MD   600 mg at 05/22/18 4920   And  . gabapentin (NEURONTIN) capsule 300 mg  300 mg Oral QAC supper Rise Patience, MD   300 mg at 05/21/18 1644  . lidocaine (LIDODERM) 5 % 1 patch  1 patch Transdermal Q24H Ollis, Brandi L, NP   1 patch at 05/22/18 1445  . metoprolol tartrate (LOPRESSOR) tablet 25 mg  25 mg Oral BID Bonnielee Haff, MD   25 mg at 05/22/18 1237  . ondansetron (ZOFRAN) tablet 4 mg  4 mg Oral Q6H PRN Rise Patience, MD       Or  . ondansetron [facility]) injection 4 mg  4 mg Intravenous Q6H PRN Rise Patience, MD      . thiamine (VITAMIN B-1) tablet 250 mg  250 mg Oral Daily Rise Patience, MD   250 mg at 05/22/18 1007  . traMADol (ULTRAM) tablet 50 mg  50 mg Oral Q6H PRN Noe Gens L, NP   50 mg at 05/21/18 2128     Discharge Medications: Please see discharge summary for a list of discharge medications.  Relevant Imaging Results:  Relevant Lab Results:   Additional Information [id number]  [place] Work [phone]

## 2018-05-22 NOTE — Progress Notes (Signed)
RT added sterile water to chamber and placed cpap on side table per pt request. Pt stated he can place self on home cpap unit when ready for bed.

## 2018-05-22 NOTE — Progress Notes (Signed)
Pharmacy Antibiotic Note  Cameron Sharp is a 82 y.o. male admitted on 05/19/2018 with bacteremia.  Pharmacy has been consulted for cefazolin dosing.  Pt admitted with e.coli urosepsis. He was started on ceftriaxone. E.coli came back pan sens. De-escalate to cefazolin today. He is morbidly obese so we will use the high dose.   Plan: Ancef 2g IV q8  Height: 6\' 3"  (190.5 cm) Weight: (!) 300 lb 3.2 oz (136.2 kg) IBW/kg (Calculated) : 84.5  Temp (24hrs), Avg:100 F (37.8 C), Min:99.4 F (37.4 C), Max:100.5 F (38.1 C)  Recent Labs  Lab 05/19/18 2300 05/19/18 2316 05/20/18 0124 05/20/18 0403 05/20/18 0616 05/20/18 1624 05/21/18 0424 05/22/18 0755  WBC 16.4*  --   --   --   --  16.1* 13.6* 8.4  CREATININE 1.40*  --   --   --   --  1.00 0.94 1.00  LATICACIDVEN  --  2.25* 1.60 1.4 1.7  --   --   --     Estimated Creatinine Clearance: 87.7 mL/min (by C-G formula based on SCr of 1 mg/dL).    No Known Allergies  Antimicrobials this admission: 10/28 ceftriaxone >> 10/31 10/31 cefazolin >>   Dose adjustments this admission:   Microbiology results: 10/28 BCx: e.coli pan sens 10/29 UCx: <10k   Onnie Boer, PharmD, [place], AAHIVP, CPP Infectious Disease Pharmacist Pager: [phone] 05/22/2018 12:26 PM

## 2018-05-22 NOTE — Clinical Social Work Note (Signed)
Clinical Social Work Assessment  Patient Details  Name: Cameron Sharp MRN: [id number] Date of Birth: June 08, 1936  Date of referral:  05/22/18               Reason for consult:  Facility Placement, Discharge Planning                Permission sought to share information with:  Family Supports Permission granted to share information::  No(Patient allowed his wife to actively participate in the conversation)  Name::        Agency::     Relationship::     Contact Information:     Housing/Transportation Living arrangements for the past 2 months:  Apartment Source of Information:  Patient, Spouse Patient Interpreter Needed:  None Criminal Activity/Legal Involvement Pertinent to Current Situation/Hospitalization:  No - Comment as needed Significant Relationships:  Spouse, Adult Children Lives with:  Spouse Do you feel safe going back to the place where you live?  No(Patient and spouse agreeable to short term rehab) Need for family participation in patient care:  Yes (Comment)  Care giving concerns:  Patient and wife expressed agreement with ST rehab at a skilled nursing facility.  Social Worker assessment / plan: CSW and CSW Intern talked with patient and his wife at the bedside. Mr. Vanvoorhis was sitting up in bed and was alert, oriented and agreeable to talking about his discharge plan. When asked, CSW advised that patient has never been to [facility] before. Rehab in a skilled nursing facility explained and the skilled facility search process. Mrs. Gange was provided with the skilled facility list and informed that they would get facility responses and make their decision from the facilities that accepted him.  During conversation, CSW informed that they have 3 children that live in [place], [place] and [place].   Employment status:  Retired Forensic scientist:  [employer] PT Recommendations:  [place] / Referral to community resources:  [place]  Patient/Family's Response to care:  No concerns expressed by Mr. Cordova or his wife regarding his care during hospitalization.  Patient/Family's Understanding of and Emotional Response to Diagnosis, Current Treatment, and Prognosis:  Patient and wife appeared to understand the need for ST rehab and the facility search process. They were advised that they will be given the names of all SNF's that responds yes and will make their decision from these facilities. Also explained to wife how she or family can research facilities via information provided on the SNF list and Mrs. Garverick responded that her daughter will probably check on the facilities with the information provided.  Emotional Assessment Appearance:  Appears stated age Attitude/Demeanor/Rapport:  Engaged Affect (typically observed):  Calm, Appropriate, Accepting Orientation:  Oriented to Situation, Oriented to  Time, Oriented to Place, Oriented to Self Alcohol / Substance use:  Alcohol Use(2 oz scotch daily) Psych involvement (Current and /or in the community):  No (Comment)  Discharge Needs  Concerns to be addressed:  Discharge Planning Concerns Readmission within the last 30 days:  No Current discharge risk:  None Barriers to Discharge:  Continued Medical Work up   [employer] Mila Homer, [place] 05/22/2018, 5:01 PM

## 2018-05-22 NOTE — Clinical Social Work Placement (Signed)
   CLINICAL SOCIAL WORK PLACEMENT  NOTE  Date:  05/22/2018  Patient Details  Name: Cameron Sharp MRN: [id number] Date of Birth: [birth year]  Clinical Social Work is seeking post-discharge placement for this patient at the [place] level of care (*CSW will initial, date and re-position this form in  chart as items are completed):  Yes   Patient/family provided with [place] Work Department's list of facilities offering this level of care within the geographic area requested by the patient (or if unable, by the patient's family).  Yes   Patient/family informed of their freedom to choose among providers that offer the needed level of care, that participate in Medicare, Medicaid or managed care program needed by the patient, have an available bed and are willing to accept the patient.  Yes   Patient/family informed of 's ownership interest in [facility] and [facility], as well as of the fact that they are under no obligation to receive care at these facilities.  PASRR submitted to EDS on 05/22/18(PASRR form reviewed by CSW before being submitted by SW Intern)     PASRR number received on 05/22/18     Existing PASRR number confirmed on       FL2 transmitted to all facilities in geographic area requested by pt/family on 05/22/18(CSW reviewed FL-2 after completion by SW Intern)     FL2 transmitted to all facilities within larger geographic area on       Patient informed that his/her managed care company has contracts with or will negotiate with certain facilities, including the following:            Patient/family informed of bed offers received.  Patient chooses bed at       Physician recommends and patient chooses bed at      Patient to be transferred to   on  .  Patient to be transferred to facility by       Patient family notified on   of transfer.  Name of family member notified:        PHYSICIAN       Additional  Comment:    _______________________________________________ Sable Feil, LCSW 05/22/2018, 5:11 PM

## 2018-05-23 DIAGNOSIS — I4821 Permanent atrial fibrillation: Secondary | ICD-10-CM

## 2018-05-23 DIAGNOSIS — N410 Acute prostatitis: Secondary | ICD-10-CM

## 2018-05-23 LAB — CBC
HEMATOCRIT: 37.9 % — AB (ref 39.0–52.0)
Hemoglobin: 12.3 g/dL — ABNORMAL LOW (ref 13.0–17.0)
MCH: 31.3 pg (ref 26.0–34.0)
MCHC: 32.5 g/dL (ref 30.0–36.0)
MCV: 96.4 fL (ref 80.0–100.0)
Platelets: 135 10*3/uL — ABNORMAL LOW (ref 150–400)
RBC: 3.93 MIL/uL — ABNORMAL LOW (ref 4.22–5.81)
RDW: 14.4 % (ref 11.5–15.5)
WBC: 7.3 10*3/uL (ref 4.0–10.5)
nRBC: 0 % (ref 0.0–0.2)

## 2018-05-23 LAB — BASIC METABOLIC PANEL
Anion gap: 3 — ABNORMAL LOW (ref 5–15)
BUN: 10 mg/dL (ref 8–23)
CALCIUM: 7.9 mg/dL — AB (ref 8.9–10.3)
CO2: 25 mmol/L (ref 22–32)
CREATININE: 1.01 mg/dL (ref 0.61–1.24)
Chloride: 108 mmol/L (ref 98–111)
GFR calc Af Amer: >60 mL/min (ref >60)
GFR calc non Af Amer: >60 mL/min (ref >60)
GLUCOSE: 101 mg/dL — AB (ref 70–99)
Potassium: 4 mmol/L (ref 3.5–5.1)
Sodium: 136 mmol/L (ref 135–145)

## 2018-05-23 NOTE — Progress Notes (Addendum)
[facility] PROGRESS NOTE  Cameron Sharp [id number]:[id number] DOB: Oct 01, 1935 DOA: 05/19/2018  PCP: [name], MD  Brief History/Interval Summary: 82 y.o.malewithhistory of atrial fibrillation, pulmonary embolism, sleep apnea, hypertension, hyperlipidemia, history of gout, history of pacemaker placement has been experiencing weakness and increased urinary frequency last 2 days. Patient had gone to urologist office and was prescribed Keflex which patient stated he had taken 2 doses. The night prior to presentation, the patient was so weak and was unable to walk.  On presentation to the hospital, the patient was found to be hypotensive and initially tachycardic. Lactate was elevated at 2.2 with WBC of 16.4. UA was compatible with UTI.   Blood cultures is growing E. coli.  Reason for Visit: E. coli bacteremia  Consultants: None  Procedures: None  Antibiotics: Ceftriaxone changed over to cefazolin  Subjective/Interval History: Patient states that he continues to improve.  Still somewhat fatigued.  Had fever last night.  Denies any abdominal pain.    ROS: Denies any shortness of breath or chest pain  Objective:  Vital Signs  Vitals:   05/22/18 2230 05/23/18 0552 05/23/18 0907 05/23/18 1150  BP:  129/71 129/66 112/61  Pulse:  72 69 67  Resp:  18  18  Temp: 99.1 F (37.3 C) 98.2 F (36.8 C)  98.8 F (37.1 C)  TempSrc: Oral Oral  Oral  SpO2:  99%  97%  Weight:  136 kg    Height:        Intake/Output Summary (Last 24 hours) at 05/23/2018 1323 Last data filed at 05/23/2018 1018 Gross per 24 hour  Intake 443.74 ml  Output 1400 ml  Net -956.26 ml   Filed Weights   05/21/18 1520 05/22/18 0548 05/23/18 0552  Weight: 126.6 kg (!) 136.2 kg 136 kg    General appearance: Awake alert.  In no distress Resp: Normal effort at rest.  Clear to auscultation bilaterally Cardio: S1-S2 is normal regular.  No S3-S4.  No rubs murmurs or bruit GI: Abdomen soft.  Nontender  nondistended.  Bowel sounds are present normal.  No masses organomegaly. Extremities: edema bilateral lower extremities.  Chronic venous disease. Neurologic: No obvious focal neurological deficits.  Lab Results:  Data Reviewed: I have personally reviewed following labs and imaging studies  CBC: Recent Labs  Lab 05/19/18 2300 05/20/18 1624 05/21/18 0424 05/22/18 0755 05/23/18 0535  WBC 16.4* 16.1* 13.6* 8.4 7.3  NEUTROABS 14.1*  --   --   --   --   HGB 14.0 12.5* 12.3* 12.6* 12.3*  HCT 42.6 37.7* 37.7* 38.1* 37.9*  MCV 97.7 97.4 97.7 95.7 96.4  PLT 147* 124* 108* 116* 135*    Basic Metabolic Panel: Recent Labs  Lab 05/19/18 2300 05/20/18 1624 05/21/18 0424 05/22/18 0755 05/23/18 0535  NA 135 136 137 136 136  K 3.6 3.3* 3.4* 3.3* 4.0  CL 107 108 108 109 108  CO2 20* 23 21* 21* 25  GLUCOSE 124* 130* 100* 115* 101*  BUN 29* 18 11 10 10   CREATININE 1.40* 1.00 0.94 1.00 1.01  CALCIUM 8.9 7.6* 7.7* 7.9* 7.9*    GFR: Estimated Creatinine Clearance: 86.7 mL/min (by C-G formula based on SCr of 1.01 mg/dL).  Liver Function Tests: Recent Labs  Lab 05/19/18 2300  AST 21  ALT 13  ALKPHOS 49  BILITOT 1.3*  PROT 6.0*  ALBUMIN 3.5    Cardiac Enzymes: Recent Labs  Lab 05/20/18 0605  TROPONINI <0.03    CBG: Recent Labs  Lab 05/20/18  [place]     Recent Results (from the past 240 hour(s))  Blood culture (routine x 2)     Status: Abnormal   Collection Time: 05/19/18 11:00 PM  Result Value Ref Range Status   Specimen Description BLOOD RIGHT HAND  Final   Special Requests   Final    BOTTLES DRAWN AEROBIC AND ANAEROBIC Blood Culture results may not be optimal due to an excessive volume of blood received in culture bottles   Culture  Setup Time   Final    GRAM NEGATIVE RODS IN BOTH AEROBIC AND ANAEROBIC BOTTLES CRITICAL RESULT CALLED TO, READ BACK BY AND VERIFIED WITHKarlene Einstein [facility] 05/20/18 1906 [name] Performed at [facility], [place]  [address] [place], [place] [address]    Culture ESCHERICHIA COLI (A)  Final   Report Status 05/22/2018 FINAL  Final   Organism ID, Bacteria ESCHERICHIA COLI  Final      Susceptibility   Escherichia coli - MIC*    AMPICILLIN 4 SENSITIVE Sensitive     CEFAZOLIN <=4 SENSITIVE Sensitive     CEFEPIME <=1 SENSITIVE Sensitive     CEFTAZIDIME <=1 SENSITIVE Sensitive     CEFTRIAXONE <=1 SENSITIVE Sensitive     CIPROFLOXACIN <=0.25 SENSITIVE Sensitive     GENTAMICIN <=1 SENSITIVE Sensitive     IMIPENEM <=0.25 SENSITIVE Sensitive     TRIMETH/SULFA <=20 SENSITIVE Sensitive     AMPICILLIN/SULBACTAM <=2 SENSITIVE Sensitive     PIP/TAZO <=4 SENSITIVE Sensitive     Extended ESBL NEGATIVE Sensitive     * ESCHERICHIA COLI  Blood Culture ID Panel (Reflexed)     Status: Abnormal   Collection Time: 05/19/18 11:00 PM  Result Value Ref Range Status   Enterococcus species NOT DETECTED NOT DETECTED Final   Listeria monocytogenes NOT DETECTED NOT DETECTED Final   Staphylococcus species NOT DETECTED NOT DETECTED Final   Staphylococcus aureus (BCID) NOT DETECTED NOT DETECTED Final   Streptococcus species NOT DETECTED NOT DETECTED Final   Streptococcus agalactiae NOT DETECTED NOT DETECTED Final   Streptococcus pneumoniae NOT DETECTED NOT DETECTED Final   Streptococcus pyogenes NOT DETECTED NOT DETECTED Final   Acinetobacter baumannii NOT DETECTED NOT DETECTED Final   Enterobacteriaceae species DETECTED (A) NOT DETECTED Final    Comment: Enterobacteriaceae represent a large family of gram-negative bacteria, not a single organism. CRITICAL RESULT CALLED TO, READ BACK BY AND VERIFIED WITH: [name] 05/20/18 1906 [name]    Enterobacter cloacae complex NOT DETECTED NOT DETECTED Final   Escherichia coli DETECTED (A) NOT DETECTED Final    Comment: CRITICAL RESULT CALLED TO, READ BACK BY AND VERIFIED WITH: [name] PHARMD 05/20/18 1906 [name]    Klebsiella oxytoca NOT DETECTED NOT DETECTED Final   Klebsiella  pneumoniae NOT DETECTED NOT DETECTED Final   Proteus species NOT DETECTED NOT DETECTED Final   Serratia marcescens NOT DETECTED NOT DETECTED Final   Carbapenem resistance NOT DETECTED NOT DETECTED Final   Haemophilus influenzae NOT DETECTED NOT DETECTED Final   Neisseria meningitidis NOT DETECTED NOT DETECTED Final   Pseudomonas aeruginosa NOT DETECTED NOT DETECTED Final   Candida albicans NOT DETECTED NOT DETECTED Final   Candida glabrata NOT DETECTED NOT DETECTED Final   Candida krusei NOT DETECTED NOT DETECTED Final   Candida parapsilosis NOT DETECTED NOT DETECTED Final   Candida tropicalis NOT DETECTED NOT DETECTED Final    Comment: Performed at [facility], [place] [address], [place], [place] [address]  Urine culture  Status: Abnormal   Collection Time: 05/20/18  1:26 AM  Result Value Ref Range Status   Specimen Description URINE, CLEAN CATCH  Final   Special Requests NONE  Final   Culture (A)  Final    <10,000 COLONIES/mL INSIGNIFICANT GROWTH Performed at [facility], [address], [place], [place] [address]    Report Status 05/21/2018 FINAL  Final  Blood culture (routine x 2)     Status: None (Preliminary result)   Collection Time: 05/20/18  6:05 AM  Result Value Ref Range Status   Specimen Description BLOOD RIGHT FOREARM  Final   Special Requests   Final    BOTTLES DRAWN AEROBIC AND ANAEROBIC Blood Culture adequate volume PATIENT ON FOLLOWING ROCEPHIN 2G   Culture   Final    NO GROWTH 3 DAYS Performed at [facility], [place] [address] [place], [place] [address]    Report Status PENDING  Incomplete  MRSA PCR Screening     Status: None   Collection Time: 05/20/18 11:19 AM  Result Value Ref Range Status   MRSA by PCR NEGATIVE NEGATIVE Final    Comment:        The GeneXpert MRSA Assay (FDA approved for NASAL specimens only), is one component of a comprehensive MRSA colonization surveillance program. It is not intended to diagnose MRSA infection  nor to guide or monitor treatment for MRSA infections. Performed at [facility], [place] [address] [place], [place] [address]       Radiology Studies: No results found.   Medications:  Scheduled: . apixaban  5 mg Oral BID  . atorvastatin  20 mg Oral Daily  . baclofen  10 mg Oral BID  . febuxostat  40 mg Oral Daily  . gabapentin  600 mg Oral Q breakfast   And  . gabapentin  300 mg Oral QAC supper  . lidocaine  1 patch Transdermal Q24H  . metoprolol tartrate  25 mg Oral BID  . vitamin B-1  250 mg Oral Daily   Continuous: . sodium chloride 250 mL (05/23/18 0611)  .  ceFAZolin (ANCEF) IV 2 g (05/23/18 0612)   KGM:WNUUVO chloride, acetaminophen **OR** acetaminophen, ondansetron **OR** ondansetron (ZOFRAN) IV, traMADol  Assessment/Plan:    Sepsis secondary to E. coli bacteremia and possible UTI versus acute prostatitis Sepsis physiology appears to have improved.  Lactic acid level improved to 1.7.  WBC has been normal for the last 2 days.  Patient had a fever of 101.6 Fahrenheit last night.  Blood cultures to be repeated.  Continue intravenous antibiotics for now.  Based on sensitivities he was changed over to cefazolin.    E. coli bacteremia Most likely from a GU source.  Sensitivities reviewed.  Continue cefazolin.  Acute kidney injury and hypokalemia Renal function is now normal.  Potassium is now normal.  CT scan did show a 3 mm calculus in the left kidney but without any hydronephrosis.  Prominent prostate with multiple prostatic calculi were seen.  Degree of prostatitis was suspected.  Patient will need outpatient follow-up with urology.  History of paroxysmal atrial fibrillation/History of Pacemaker Anticoagulated with apixaban which is being continued.  Was on metoprolol at home which was held at the time of admission due to hypotension.  Metoprolol was resumed on 10/31.  Vital signs are stable.  Essential hypertension Patient on [place]-HCT as home.  Continue to  hold for now.  Blood pressure is reasonably well controlled.  History of obstructive sleep apnea CPAP  Normocytic anemia and mild thrombocytopenia Hemoglobin and platelet counts are both stable.  No evidence of overt bleeding.  Other incidental findings on CT renal study Scattered diverticulosis was seen.  Evidence for atherosclerosis.  Degenerative changes in the spine was also noted.  Small ventral hernia was seen.  These can be addressed in the outpatient setting.  Hyperlipidemia Continue statins.  History of Paget's disease Outpatient follow-up with PCP  DVT Prophylaxis: On apixaban    Code Status: Full code Family Communication: Discussed with the patient.  No family at bedside Disposition Plan: Physical therapy has recommended short-term rehab at skilled nursing facility.  Social worker is following.  Not yet medically ready.  Continues to have fever.     LOS: 3 days   [place] [facility] Pager [phone] 05/23/2018, 1:23 PM  If 7PM-7AM, please contact night-coverage at [email or web address], password [facility]

## 2018-05-23 NOTE — Clinical Social Work Note (Addendum)
CSW provided patient with list of bed offers first. First preference was [facility] facilities but they only take ALF/ILF residents. Second preference is [facility] but they declined. [facility] is third preference. They have no responded to referral yet so left message for admissions coordinator asking her to review.  Dayton Scrape, [place]  1:05 pm [facility] is able to offer patient a bed when stable.  Dayton Scrape, [place]

## 2018-05-23 NOTE — Care Management Important Message (Signed)
Important Message  Patient Details  Name: Cameron Sharp MRN: [id number] Date of Birth: Dec 15, 1935   Medicare Important Message Given:  Yes    [employer] P Jaidee Stipe 05/23/2018, 5:42 PM

## 2018-05-23 NOTE — Progress Notes (Signed)
Pt placed self on home CPAP. Sleeping at this time

## 2018-05-23 NOTE — Plan of Care (Signed)
  Problem: Education: Goal: Knowledge of General Education information will improve Description Including pain rating scale, medication(s)/side effects and non-pharmacologic comfort measures Outcome: Progressing   Problem: Health Behavior/Discharge Planning: Goal: Ability to manage health-related needs will improve Outcome: Progressing   Problem: Clinical Measurements: Goal: Will remain free from infection Outcome: Progressing   Problem: Clinical Measurements: Goal: Respiratory complications will improve Outcome: Progressing   Problem: Activity: Goal: Risk for activity intolerance will decrease Outcome: Progressing   Problem: Coping: Goal: Level of anxiety will decrease Outcome: Progressing   

## 2018-05-24 MED ORDER — CEPHALEXIN 250 MG PO CAPS
500.0000 mg | ORAL_CAPSULE | Freq: Four times a day (QID) | ORAL | Status: DC
  Administered 2018-05-24 – 2018-05-25 (×5): 500 mg via ORAL
  Filled 2018-05-24 (×5): qty 2

## 2018-05-24 NOTE — Progress Notes (Signed)
[facility] PROGRESS NOTE  Cameron Sharp LXB:[id number] DOB: 02-Oct-1935 DOA: 05/19/2018  PCP: [name], MD  Brief History/Interval Summary: 82 y.o.malewithhistory of atrial fibrillation, pulmonary embolism, sleep apnea, hypertension, hyperlipidemia, history of gout, history of pacemaker placement has been experiencing weakness and increased urinary frequency last 2 days. Patient had gone to urologist office and was prescribed Keflex which patient stated he had taken 2 doses. The night prior to presentation, the patient was so weak and was unable to walk.  On presentation to the hospital, the patient was found to be hypotensive and initially tachycardic. Lactate was elevated at 2.2 with WBC of 16.4. UA was compatible with UTI.   Blood cultures is growing E. coli.  Reason for Visit: E. coli bacteremia  Consultants: None  Procedures: None  Antibiotics: Ceftriaxone changed over to cefazolin Cefazolin to be changed over to Keflex today  Subjective/Interval History: Patient states that he continues to feel well.  Denies any nausea or vomiting.  No abdominal pain.  No difficulty urinating.  No fever or chills.  ROS: Denies any shortness of breath  Objective:  Vital Signs  Vitals:   05/23/18 1920 05/23/18 2134 05/24/18 0625 05/24/18 0920  BP: 123/63 (!) 116/59 129/61 (!) 142/73  Pulse: (!) 59 65 61 80  Resp: 18  18   Temp: 99 F (37.2 C)  98.6 F (37 C)   TempSrc: Oral  Oral   SpO2: 98%  97%   Weight:   134.8 kg   Height:        Intake/Output Summary (Last 24 hours) at 05/24/2018 1141 Last data filed at 05/24/2018 1000 Gross per 24 hour  Intake 765.54 ml  Output 2050 ml  Net -1284.46 ml   Filed Weights   05/22/18 0548 05/23/18 0552 05/24/18 0625  Weight: (!) 136.2 kg 136 kg 134.8 kg    General appearance: Awake alert.  In no distress. Resp: Normal effort.  Clear to auscultation bilaterally Cardio: S1-S2 is normal regular.  No S3-S4.  No rubs murmurs  or bruit GI: Abdomen is soft.  Nontender nondistended.  Bowel sounds are present normal.  No masses organomegaly Extremities: Chronic venous stasis is noted in lower extremities Neurologic: No focal neurological deficits.  Lab Results:  Data Reviewed: I have personally reviewed following labs and imaging studies  CBC: Recent Labs  Lab 05/19/18 2300 05/20/18 1624 05/21/18 0424 05/22/18 0755 05/23/18 0535  WBC 16.4* 16.1* 13.6* 8.4 7.3  NEUTROABS 14.1*  --   --   --   --   HGB 14.0 12.5* 12.3* 12.6* 12.3*  HCT 42.6 37.7* 37.7* 38.1* 37.9*  MCV 97.7 97.4 97.7 95.7 96.4  PLT 147* 124* 108* 116* 135*    Basic Metabolic Panel: Recent Labs  Lab 05/19/18 2300 05/20/18 1624 05/21/18 0424 05/22/18 0755 05/23/18 0535  NA 135 136 137 136 136  K 3.6 3.3* 3.4* 3.3* 4.0  CL 107 108 108 109 108  CO2 20* 23 21* 21* 25  GLUCOSE 124* 130* 100* 115* 101*  BUN 29* 18 11 10 10   CREATININE 1.40* 1.00 0.94 1.00 1.01  CALCIUM 8.9 7.6* 7.7* 7.9* 7.9*    GFR: Estimated Creatinine Clearance: 86.3 mL/min (by C-G formula based on SCr of 1.01 mg/dL).  Liver Function Tests: Recent Labs  Lab 05/19/18 2300  AST 21  ALT 13  ALKPHOS 49  BILITOT 1.3*  PROT 6.0*  ALBUMIN 3.5    Cardiac Enzymes: Recent Labs  Lab 05/20/18 0605  TROPONINI <0.03  CBG: Recent Labs  Lab 05/20/18 1202  GLUCAP 99     Recent Results (from the past 240 hour(s))  Blood culture (routine x 2)     Status: Abnormal   Collection Time: 05/19/18 11:00 PM  Result Value Ref Range Status   Specimen Description BLOOD RIGHT HAND  Final   Special Requests   Final    BOTTLES DRAWN AEROBIC AND ANAEROBIC Blood Culture results may not be optimal due to an excessive volume of blood received in culture bottles   Culture  Setup Time   Final    GRAM NEGATIVE RODS IN BOTH AEROBIC AND ANAEROBIC BOTTLES CRITICAL RESULT CALLED TO, READ BACK BY AND VERIFIED WITHKarlene Einstein [facility] 05/20/18 1906 [name] Performed at [facility], [place] [address] [place], [place] [address]    Culture ESCHERICHIA COLI (A)  Final   Report Status 05/22/2018 FINAL  Final   Organism ID, Bacteria ESCHERICHIA COLI  Final      Susceptibility   Escherichia coli - MIC*    AMPICILLIN 4 SENSITIVE Sensitive     CEFAZOLIN <=4 SENSITIVE Sensitive     CEFEPIME <=1 SENSITIVE Sensitive     CEFTAZIDIME <=1 SENSITIVE Sensitive     CEFTRIAXONE <=1 SENSITIVE Sensitive     CIPROFLOXACIN <=0.25 SENSITIVE Sensitive     GENTAMICIN <=1 SENSITIVE Sensitive     IMIPENEM <=0.25 SENSITIVE Sensitive     TRIMETH/SULFA <=20 SENSITIVE Sensitive     AMPICILLIN/SULBACTAM <=2 SENSITIVE Sensitive     PIP/TAZO <=4 SENSITIVE Sensitive     Extended ESBL NEGATIVE Sensitive     * ESCHERICHIA COLI  Blood Culture ID Panel (Reflexed)     Status: Abnormal   Collection Time: 05/19/18 11:00 PM  Result Value Ref Range Status   Enterococcus species NOT DETECTED NOT DETECTED Final   Listeria monocytogenes NOT DETECTED NOT DETECTED Final   Staphylococcus species NOT DETECTED NOT DETECTED Final   Staphylococcus aureus (BCID) NOT DETECTED NOT DETECTED Final   Streptococcus species NOT DETECTED NOT DETECTED Final   Streptococcus agalactiae NOT DETECTED NOT DETECTED Final   Streptococcus pneumoniae NOT DETECTED NOT DETECTED Final   Streptococcus pyogenes NOT DETECTED NOT DETECTED Final   Acinetobacter baumannii NOT DETECTED NOT DETECTED Final   Enterobacteriaceae species DETECTED (A) NOT DETECTED Final    Comment: Enterobacteriaceae represent a large family of gram-negative bacteria, not a single organism. CRITICAL RESULT CALLED TO, READ BACK BY AND VERIFIED WITH: [name] 05/20/18 1906 [name]    Enterobacter cloacae complex NOT DETECTED NOT DETECTED Final   Escherichia coli DETECTED (A) NOT DETECTED Final    Comment: CRITICAL RESULT CALLED TO, READ BACK BY AND VERIFIED WITH: [name] PHARMD 05/20/18 1906 [name]    Klebsiella oxytoca NOT DETECTED NOT  DETECTED Final   Klebsiella pneumoniae NOT DETECTED NOT DETECTED Final   Proteus species NOT DETECTED NOT DETECTED Final   Serratia marcescens NOT DETECTED NOT DETECTED Final   Carbapenem resistance NOT DETECTED NOT DETECTED Final   Haemophilus influenzae NOT DETECTED NOT DETECTED Final   Neisseria meningitidis NOT DETECTED NOT DETECTED Final   Pseudomonas aeruginosa NOT DETECTED NOT DETECTED Final   Candida albicans NOT DETECTED NOT DETECTED Final   Candida glabrata NOT DETECTED NOT DETECTED Final   Candida krusei NOT DETECTED NOT DETECTED Final   Candida parapsilosis NOT DETECTED NOT DETECTED Final   Candida tropicalis NOT DETECTED NOT DETECTED Final    Comment: Performed at [facility], [place] [address],  [place], [place] [address]  Urine culture     Status: Abnormal   Collection Time: 05/20/18  1:26 AM  Result Value Ref Range Status   Specimen Description URINE, CLEAN CATCH  Final   Special Requests NONE  Final   Culture (A)  Final    <10,000 COLONIES/mL INSIGNIFICANT GROWTH Performed at [facility] [address], [place], [place] [address]    Report Status 05/21/2018 FINAL  Final  Blood culture (routine x 2)     Status: None (Preliminary result)   Collection Time: 05/20/18  6:05 AM  Result Value Ref Range Status   Specimen Description BLOOD RIGHT FOREARM  Final   Special Requests   Final    BOTTLES DRAWN AEROBIC AND ANAEROBIC Blood Culture adequate volume PATIENT ON FOLLOWING ROCEPHIN 2G   Culture   Final    NO GROWTH 4 DAYS Performed at [facility], [place] [address], [place], [place] [address]    Report Status PENDING  Incomplete  MRSA PCR Screening     Status: None   Collection Time: 05/20/18 11:19 AM  Result Value Ref Range Status   MRSA by PCR NEGATIVE NEGATIVE Final    Comment:        The GeneXpert MRSA Assay (FDA approved for NASAL specimens only), is one component of a comprehensive MRSA colonization surveillance program. It is not intended  to diagnose MRSA infection nor to guide or monitor treatment for MRSA infections. Performed at [facility], [place] [address], [place], [place] [address]   Culture, blood (Routine X 2) w Reflex to ID Panel     Status: None (Preliminary result)   Collection Time: 05/23/18  8:37 AM  Result Value Ref Range Status   Specimen Description BLOOD LEFT ANTECUBITAL  Final   Special Requests   Final    BOTTLES DRAWN AEROBIC ONLY Blood Culture adequate volume   Culture   Final    NO GROWTH < 24 HOURS Performed at [facility], [place] [address] [place], [place] [address]    Report Status PENDING  Incomplete  Culture, blood (Routine X 2) w Reflex to ID Panel     Status: None (Preliminary result)   Collection Time: 05/23/18  8:42 AM  Result Value Ref Range Status   Specimen Description BLOOD RIGHT HAND  Final   Special Requests   Final    BOTTLES DRAWN AEROBIC ONLY Blood Culture adequate volume   Culture   Final    NO GROWTH < 24 HOURS Performed at [facility], [place] [address], [place], [place] [address]    Report Status PENDING  Incomplete      Radiology Studies: No results found.   Medications:  Scheduled: . apixaban  5 mg Oral BID  . atorvastatin  20 mg Oral Daily  . baclofen  10 mg Oral BID  . cephALEXin  500 mg Oral Q6H  . febuxostat  40 mg Oral Daily  . gabapentin  600 mg Oral Q breakfast   And  . gabapentin  300 mg Oral QAC supper  . lidocaine  1 patch Transdermal Q24H  . metoprolol tartrate  25 mg Oral BID  . vitamin B-1  250 mg Oral Daily   Continuous: . sodium chloride 250 mL (05/23/18 2140)   QHU:TMLYYT chloride, acetaminophen **OR** acetaminophen, ondansetron **OR** ondansetron (ZOFRAN) IV, traMADol    Assessment/Plan:  Sepsis secondary to E. coli bacteremia and possible UTI versus acute prostatitis Sepsis physiology has  resolved.  Lactic acid level improved to 1.7.  WBC has been normal.  No further episodes of fever.  Repeat blood cultures  negative so far.  Okay to change to oral antibiotics today.     E. coli bacteremia Most likely from a GU source.  Sensitivities reviewed.  Patient was changed over from ceftriaxone to cefazolin.  Can be changed over to oral Keflex today.  Acute kidney injury and hypokalemia Renal function and potassium levels were normal yesterday.  We will recheck tomorrow.  CT scan did show a 3 mm calculus in the left kidney but without any hydronephrosis.  Prominent prostate with multiple prostatic calculi were seen.  Degree of prostatitis was suspected.  Patient will need outpatient follow-up with urology.  He was recently seen at the urologist office.  He will need to make another appointment to see Dr. Alinda Money.  History of paroxysmal atrial fibrillation/History of Pacemaker Anticoagulated with apixaban which is being continued.  Was on metoprolol at home which was held at the time of admission due to hypotension.  Metoprolol was resumed on 10/31.  Vital signs are stable.  Essential hypertension Blood pressure is reasonably well controlled.  Continue to hold Diovan-HCT.  History of obstructive sleep apnea CPAP  Normocytic anemia and mild thrombocytopenia Hemoglobin and platelet counts are both stable.  No evidence of overt bleeding.  Recheck labs tomorrow.  Other incidental findings on CT renal study Scattered diverticulosis was seen.  Evidence for atherosclerosis.  Degenerative changes in the spine was also noted.  Small ventral hernia was seen.  These can be addressed in the outpatient setting.  Hyperlipidemia Continue statins.  History of Paget's disease Outpatient follow-up with PCP  DVT Prophylaxis: On apixaban    Code Status: Full code Family Communication: Discussed with the patient.  No family at bedside Disposition Plan: Physical therapy has recommended short-term rehab at skilled nursing facility.  If he remains afebrile for the next 24 hours then he could be discharged to skilled  nursing facility.    LOS: 4 days   [facility] Pager [phone] 05/24/2018, 11:41 AM  If 7PM-7AM, please contact night-coverage at [email or web address], password [facility]

## 2018-05-24 NOTE — Progress Notes (Signed)
Patient placed himself on home CPAP.

## 2018-05-25 DIAGNOSIS — B9620 Unspecified Escherichia coli [E. coli] as the cause of diseases classified elsewhere: Secondary | ICD-10-CM | POA: Diagnosis not present

## 2018-05-25 DIAGNOSIS — M6281 Muscle weakness (generalized): Secondary | ICD-10-CM | POA: Diagnosis not present

## 2018-05-25 DIAGNOSIS — Z7901 Long term (current) use of anticoagulants: Secondary | ICD-10-CM | POA: Diagnosis not present

## 2018-05-25 DIAGNOSIS — I10 Essential (primary) hypertension: Secondary | ICD-10-CM | POA: Diagnosis not present

## 2018-05-25 DIAGNOSIS — E785 Hyperlipidemia, unspecified: Secondary | ICD-10-CM | POA: Diagnosis not present

## 2018-05-25 DIAGNOSIS — M62838 Other muscle spasm: Secondary | ICD-10-CM | POA: Diagnosis not present

## 2018-05-25 DIAGNOSIS — G4733 Obstructive sleep apnea (adult) (pediatric): Secondary | ICD-10-CM | POA: Diagnosis not present

## 2018-05-25 DIAGNOSIS — M2550 Pain in unspecified joint: Secondary | ICD-10-CM | POA: Diagnosis not present

## 2018-05-25 DIAGNOSIS — N410 Acute prostatitis: Secondary | ICD-10-CM | POA: Diagnosis not present

## 2018-05-25 DIAGNOSIS — A4189 Other specified sepsis: Secondary | ICD-10-CM | POA: Diagnosis not present

## 2018-05-25 DIAGNOSIS — R5381 Other malaise: Secondary | ICD-10-CM | POA: Diagnosis not present

## 2018-05-25 DIAGNOSIS — M48061 Spinal stenosis, lumbar region without neurogenic claudication: Secondary | ICD-10-CM | POA: Diagnosis not present

## 2018-05-25 DIAGNOSIS — I480 Paroxysmal atrial fibrillation: Secondary | ICD-10-CM | POA: Diagnosis not present

## 2018-05-25 DIAGNOSIS — Z6835 Body mass index (BMI) 35.0-35.9, adult: Secondary | ICD-10-CM | POA: Diagnosis not present

## 2018-05-25 DIAGNOSIS — N390 Urinary tract infection, site not specified: Secondary | ICD-10-CM | POA: Diagnosis not present

## 2018-05-25 DIAGNOSIS — R278 Other lack of coordination: Secondary | ICD-10-CM | POA: Diagnosis not present

## 2018-05-25 DIAGNOSIS — Z950 Presence of cardiac pacemaker: Secondary | ICD-10-CM | POA: Diagnosis not present

## 2018-05-25 DIAGNOSIS — Z9989 Dependence on other enabling machines and devices: Secondary | ICD-10-CM | POA: Diagnosis not present

## 2018-05-25 DIAGNOSIS — G629 Polyneuropathy, unspecified: Secondary | ICD-10-CM | POA: Diagnosis not present

## 2018-05-25 DIAGNOSIS — Z7401 Bed confinement status: Secondary | ICD-10-CM | POA: Diagnosis not present

## 2018-05-25 DIAGNOSIS — R2681 Unsteadiness on feet: Secondary | ICD-10-CM | POA: Diagnosis not present

## 2018-05-25 DIAGNOSIS — K5900 Constipation, unspecified: Secondary | ICD-10-CM | POA: Diagnosis not present

## 2018-05-25 DIAGNOSIS — R4181 Age-related cognitive decline: Secondary | ICD-10-CM | POA: Diagnosis not present

## 2018-05-25 DIAGNOSIS — R2689 Other abnormalities of gait and mobility: Secondary | ICD-10-CM | POA: Diagnosis not present

## 2018-05-25 DIAGNOSIS — M109 Gout, unspecified: Secondary | ICD-10-CM | POA: Diagnosis not present

## 2018-05-25 DIAGNOSIS — R7881 Bacteremia: Secondary | ICD-10-CM | POA: Diagnosis not present

## 2018-05-25 DIAGNOSIS — R52 Pain, unspecified: Secondary | ICD-10-CM | POA: Diagnosis not present

## 2018-05-25 LAB — BASIC METABOLIC PANEL
Anion gap: 4 — ABNORMAL LOW (ref 5–15)
BUN: 10 mg/dL (ref 8–23)
CO2: 27 mmol/L (ref 22–32)
Calcium: 8.3 mg/dL — ABNORMAL LOW (ref 8.9–10.3)
Chloride: 108 mmol/L (ref 98–111)
Creatinine, Ser: 1 mg/dL (ref 0.61–1.24)
GFR calc Af Amer: >60 mL/min (ref >60)
GFR calc non Af Amer: >60 mL/min (ref >60)
Glucose, Bld: 96 mg/dL (ref 70–99)
Potassium: 4.2 mmol/L (ref 3.5–5.1)
Sodium: 139 mmol/L (ref 135–145)

## 2018-05-25 LAB — CBC
HCT: 39.3 % (ref 39.0–52.0)
Hemoglobin: 12.6 g/dL — ABNORMAL LOW (ref 13.0–17.0)
MCH: 31.1 pg (ref 26.0–34.0)
MCHC: 32.1 g/dL (ref 30.0–36.0)
MCV: 97 fL (ref 80.0–100.0)
Platelets: 203 10*3/uL (ref 150–400)
RBC: 4.05 MIL/uL — ABNORMAL LOW (ref 4.22–5.81)
RDW: 14.7 % (ref 11.5–15.5)
WBC: 7.3 10*3/uL (ref 4.0–10.5)
nRBC: 0 % (ref 0.0–0.2)

## 2018-05-25 LAB — CULTURE, BLOOD (ROUTINE X 2): CULTURE: NO GROWTH

## 2018-05-25 MED ORDER — CEPHALEXIN 500 MG PO CAPS: 500.0000 mg | ORAL_CAPSULE | Freq: Four times a day (QID) | ORAL | Status: AC

## 2018-05-25 MED ORDER — SENNA 8.6 MG PO TABS: 2.0000 | ORAL_TABLET | Freq: Every day | ORAL | Status: DC

## 2018-05-25 MED ORDER — TRAMADOL HCL 50 MG PO TABS: 50.0000 mg | ORAL_TABLET | Freq: Four times a day (QID) | ORAL | 0 refills | Status: DC | PRN

## 2018-05-25 MED ORDER — SENNA 8.6 MG PO TABS: 2.0000 | ORAL_TABLET | Freq: Every day | ORAL | 0 refills | Status: DC

## 2018-05-25 MED ORDER — METOPROLOL TARTRATE 25 MG PO TABS: 25.0000 mg | ORAL_TABLET | Freq: Two times a day (BID) | ORAL | Status: DC

## 2018-05-25 NOTE — Discharge Summary (Signed)
[facility]  Physician Discharge Summary   Patient ID: Cameron Sharp MRN: [id number] DOB/AGE: 80-29-1937 80 y.o.  Admit date: 05/19/2018 Discharge date: 05/25/2018  PCP: [name], MD  DISCHARGE DIAGNOSES:  E. coli bacteremia Possible acute prostatitis Sepsis secondary to above, resolved Paroxysmal atrial fibrillation Pacemaker in place Essential hypertension Obstructive sleep apnea on CPAP    RECOMMENDATIONS FOR OUTPATIENT FOLLOW UP: 1. Recommend checking CBC and basic metabolic panel in 3 to 4 days 2. Incidental findings on CT scan to be addressed in the outpatient setting.  See below. 3. Needs to be seen by Dr. Dutch Gray with urology in the next few weeks.  DISCHARGE CONDITION: fair  Diet recommendation: Heart healthy  Filed Weights   05/23/18 0552 05/24/18 0625 05/25/18 0634  Weight: 136 kg 134.8 kg 132.6 kg    INITIAL HISTORY: 82 y.o.malewithhistory of atrial fibrillation, pulmonary embolism, sleep apnea, hypertension, hyperlipidemia, history of gout, history of pacemaker placement has been experiencing weakness and increased urinary frequency last 2 days.Patient hadgone to urologist office and was prescribed Keflex which patient statedhe had taken 2 doses. The night prior to presentation, thepatient wasso weak and was unable to walk.On presentation to the hospital, the patient was found to behypotensive and initially tachycardic. Lactate was elevated at 2.2 with WBC of 16.4. UA was compatible with UTI.Blood cultures is growing E. coli.   HOSPITAL COURSE:   Sepsis secondary to E. coli bacteremia and possible UTI versus acute prostatitis Sepsis physiology has resolved.  Lactic acid level improved to 1.7.  WBC has been normal.  No further episodes of fever.  Repeat blood cultures negative so far.  Patient was changed over to Keflex on 11/2.  Has been afebrile.  Will give this for 8 more days to complete 14 days of treatment.  E. coli  bacteremia Most likely from a GU source.  Sensitivities reviewed.  Patient was changed over from ceftriaxone to cefazolin.    And then changed over to Keflex.  Patient remains afebrile.  See above.  Acute kidney injury and hypokalemia Most likely secondary to sepsis physiology.  Renal function is back to baseline.  Potassium is normal.  CT scan did show a 3 mm calculus in the left kidney but without any hydronephrosis.  Prominent prostate with multiple prostatic calculi were seen.  Prostatitis was suspected.  Patient will need outpatient follow-up with urology.  He was recently seen at the urologist office.  He will need to make another appointment to see Dr. Alinda Money.  History of paroxysmal atrial fibrillation/History of Pacemaker Anticoagulated with apixaban which is being continued.  Was on metoprolol at home which was held at the time of admission due to hypotension.  Metoprolol was resumed on 10/31.  Vital signs are stable.  Continue with anticoagulation as well.  Essential hypertension Pressures well controlled just on metoprolol.  Patient was on Diovan-HCT at home.  Continue to hold this medication for now and monitor blood pressure trends.  History of obstructive sleep apnea CPAP  Normocytic anemia and mild thrombocytopenia Platelet count has significantly improved.  Hemoglobin is stable. No evidence of overt bleeding.   Other incidental findings on CT renal study Scattered diverticulosis was seen.  Evidence for atherosclerosis.  Degenerative changes in the spine was also noted.  Small ventral hernia was seen.  These can be addressed in the outpatient setting.  Hyperlipidemia Continue statins.  History of Paget's disease Outpatient follow-up with PCP  Overall stable.  Okay for discharge to skilled nursing facility today.  PERTINENT LABS:  The results of significant diagnostics from this hospitalization (including imaging, microbiology, ancillary and laboratory) are  listed below for reference.    Microbiology: Recent Results (from the past 240 hour(s))  Blood culture (routine x 2)     Status: Abnormal   Collection Time: 05/19/18 11:00 PM  Result Value Ref Range Status   Specimen Description BLOOD RIGHT HAND  Final   Special Requests   Final    BOTTLES DRAWN AEROBIC AND ANAEROBIC Blood Culture results may not be optimal due to an excessive volume of blood received in culture bottles   Culture  Setup Time   Final    GRAM NEGATIVE RODS IN BOTH AEROBIC AND ANAEROBIC BOTTLES CRITICAL RESULT CALLED TO, READ BACK BY AND VERIFIED WITHKarlene Einstein [facility] 05/20/18 1906 [name] Performed at [facility], [place] [address], [place], [place] [address]    Culture ESCHERICHIA COLI (A)  Final   Report Status 05/22/2018 FINAL  Final   Organism ID, Bacteria ESCHERICHIA COLI  Final      Susceptibility   Escherichia coli - MIC*    AMPICILLIN 4 SENSITIVE Sensitive     CEFAZOLIN <=4 SENSITIVE Sensitive     CEFEPIME <=1 SENSITIVE Sensitive     CEFTAZIDIME <=1 SENSITIVE Sensitive     CEFTRIAXONE <=1 SENSITIVE Sensitive     CIPROFLOXACIN <=0.25 SENSITIVE Sensitive     GENTAMICIN <=1 SENSITIVE Sensitive     IMIPENEM <=0.25 SENSITIVE Sensitive     TRIMETH/SULFA <=20 SENSITIVE Sensitive     AMPICILLIN/SULBACTAM <=2 SENSITIVE Sensitive     PIP/TAZO <=4 SENSITIVE Sensitive     Extended ESBL NEGATIVE Sensitive     * ESCHERICHIA COLI  Blood Culture ID Panel (Reflexed)     Status: Abnormal   Collection Time: 05/19/18 11:00 PM  Result Value Ref Range Status   Enterococcus species NOT DETECTED NOT DETECTED Final   Listeria monocytogenes NOT DETECTED NOT DETECTED Final   Staphylococcus species NOT DETECTED NOT DETECTED Final   Staphylococcus aureus (BCID) NOT DETECTED NOT DETECTED Final   Streptococcus species NOT DETECTED NOT DETECTED Final   Streptococcus agalactiae NOT DETECTED NOT DETECTED Final   Streptococcus pneumoniae NOT DETECTED NOT DETECTED Final    Streptococcus pyogenes NOT DETECTED NOT DETECTED Final   Acinetobacter baumannii NOT DETECTED NOT DETECTED Final   Enterobacteriaceae species DETECTED (A) NOT DETECTED Final    Comment: Enterobacteriaceae represent a large family of gram-negative bacteria, not a single organism. CRITICAL RESULT CALLED TO, READ BACK BY AND VERIFIED WITH: [name] 05/20/18 1906 [name]    Enterobacter cloacae complex NOT DETECTED NOT DETECTED Final   Escherichia coli DETECTED (A) NOT DETECTED Final    Comment: CRITICAL RESULT CALLED TO, READ BACK BY AND VERIFIED WITH: [name] PHARMD 05/20/18 1906 [name]    Klebsiella oxytoca NOT DETECTED NOT DETECTED Final   Klebsiella pneumoniae NOT DETECTED NOT DETECTED Final   Proteus species NOT DETECTED NOT DETECTED Final   Serratia marcescens NOT DETECTED NOT DETECTED Final   Carbapenem resistance NOT DETECTED NOT DETECTED Final   Haemophilus influenzae NOT DETECTED NOT DETECTED Final   Neisseria meningitidis NOT DETECTED NOT DETECTED Final   Pseudomonas aeruginosa NOT DETECTED NOT DETECTED Final   Candida albicans NOT DETECTED NOT DETECTED Final   Candida glabrata NOT DETECTED NOT DETECTED Final   Candida krusei NOT DETECTED NOT DETECTED Final   Candida parapsilosis NOT DETECTED NOT DETECTED Final   Candida tropicalis NOT DETECTED NOT DETECTED Final  Comment: Performed at [facility], [place] [address], [place], [place] [address]  Urine culture     Status: Abnormal   Collection Time: 05/20/18  1:26 AM  Result Value Ref Range Status   Specimen Description URINE, CLEAN CATCH  Final   Special Requests NONE  Final   Culture (A)  Final    <10,000 COLONIES/mL INSIGNIFICANT GROWTH Performed at [facility], [address], [place], [place] [address]    Report Status 05/21/2018 FINAL  Final  Blood culture (routine x 2)     Status: None (Preliminary result)   Collection Time: 05/20/18  6:05 AM  Result Value Ref Range Status   Specimen  Description BLOOD RIGHT FOREARM  Final   Special Requests   Final    BOTTLES DRAWN AEROBIC AND ANAEROBIC Blood Culture adequate volume PATIENT ON FOLLOWING ROCEPHIN 2G   Culture   Final    NO GROWTH 4 DAYS Performed at [facility], [place] [address], [place], [place] [address]    Report Status PENDING  Incomplete  MRSA PCR Screening     Status: None   Collection Time: 05/20/18 11:19 AM  Result Value Ref Range Status   MRSA by PCR NEGATIVE NEGATIVE Final    Comment:        The GeneXpert MRSA Assay (FDA approved for NASAL specimens only), is one component of a comprehensive MRSA colonization surveillance program. It is not intended to diagnose MRSA infection nor to guide or monitor treatment for MRSA infections. Performed at [facility], [place] [address], [place], [place] [address]   Culture, blood (Routine X 2) w Reflex to ID Panel     Status: None (Preliminary result)   Collection Time: 05/23/18  8:37 AM  Result Value Ref Range Status   Specimen Description BLOOD LEFT ANTECUBITAL  Final   Special Requests   Final    BOTTLES DRAWN AEROBIC ONLY Blood Culture adequate volume   Culture   Final    NO GROWTH 1 DAY Performed at [facility], [place] [address] [place], [place] [address]    Report Status PENDING  Incomplete  Culture, blood (Routine X 2) w Reflex to ID Panel     Status: None (Preliminary result)   Collection Time: 05/23/18  8:42 AM  Result Value Ref Range Status   Specimen Description BLOOD RIGHT HAND  Final   Special Requests   Final    BOTTLES DRAWN AEROBIC ONLY Blood Culture adequate volume   Culture   Final    NO GROWTH 1 DAY Performed at [facility], [place] [address], [place],  [address]    Report Status PENDING  Incomplete     Labs: Basic Metabolic Panel: Recent Labs  Lab 05/20/18 1624 05/21/18 0424 05/22/18 0755 05/23/18 0535 05/25/18 0509  NA 136 137 136 136 139  K 3.3* 3.4* 3.3* 4.0 4.2  CL 108 108 109 108 108  CO2  23 21* 21* 25 27  GLUCOSE 130* 100* 115* 101* 96  BUN 18 11 10 10 10   CREATININE 1.00 0.94 1.00 1.01 1.00  CALCIUM 7.6* 7.7* 7.9* 7.9* 8.3*   Liver Function Tests: Recent Labs  Lab 05/19/18 2300  AST 21  ALT 13  ALKPHOS 49  BILITOT 1.3*  PROT 6.0*  ALBUMIN 3.5   CBC: Recent Labs  Lab 05/19/18 2300 05/20/18 1624 05/21/18 0424 05/22/18 0755 05/23/18 0535 05/25/18 0509  WBC 16.4* 16.1* 13.6* 8.4 7.3 7.3  NEUTROABS 14.1*  --   --   --   --   --   HGB 14.0 12.5* 12.3* 12.6* 12.3* 12.6*  HCT 42.6 37.7* 37.7* 38.1* 37.9* 39.3  MCV 97.7 97.4 97.7 95.7 96.4 97.0  PLT 147* 124* 108* 116* 135* 203   Cardiac Enzymes: Recent Labs  Lab 05/20/18 0605  TROPONINI <0.03   CBG: Recent Labs  Lab 05/20/18 1202  GLUCAP 99     IMAGING STUDIES Dg Chest 2 View  Result Date: 05/20/2018 CLINICAL DATA:  Weakness and fever. EXAM: CHEST - 2 VIEW COMPARISON:  10/28/2017 FINDINGS: Left-sided pacemaker unchanged. Lungs are adequately inflated without consolidation or effusion. Mild stable cardiomegaly. Remainder of the exam is unchanged. IMPRESSION: No acute findings. Mild stable cardiomegaly. Electronically Signed   By: Marin Olp M.D.   On: 05/20/2018 01:13   Ct Renal Stone Study  Result Date: 05/20/2018 CLINICAL DATA:  Flank pain EXAM: CT ABDOMEN AND PELVIS WITHOUT CONTRAST TECHNIQUE: Multidetector CT imaging of the abdomen and pelvis was performed following the standard protocol without oral or IV contrast. COMPARISON:  September 24, 2016 FINDINGS: Lower chest: There is atelectatic change in lung bases. Pacemaker leads are attached to the right heart. There are foci of coronary artery calcification. Hepatobiliary: No focal liver lesions are evident on this noncontrast enhanced study. Gallbladder is absent. There is no appreciable biliary duct dilatation. Pancreas: There is no evident pancreatic mass or inflammatory focus. Spleen: No splenic lesions are evident. Adrenals/Urinary Tract:  Adrenals bilaterally appear unremarkable. There is a cyst arising from the medial anterior left kidney measuring 3.8 x 3.0 cm. There is a cyst arising laterally from the upper pole of the left kidney measuring 2.1 x 1.8 cm. There is no appreciable hydronephrosis on either side. There is a 3 mm calculus in the lower pole of the left kidney. There is no evident ureteral calculus on either side. Urinary bladder is midline with wall thickness within normal limits. Stomach/Bowel: There are scattered left-sided colonic diverticula without diverticulitis. There is no appreciable bowel wall or mesenteric thickening. There is no evident bowel obstruction. There is no free air or portal venous air. Vascular/Lymphatic: There is aortoiliac atherosclerosis. No aneurysm evident. Major mesenteric arterial vessels appear patent on this noncontrast enhanced study, although there is atherosclerotic calcification in the proximal major mesenteric arterial vessels. No adenopathy is appreciable in the abdomen or pelvis. Reproductive: Prostate is prominent and contains multiple calculi. Prostate abuts the inferior aspect of the urinary bladder. There is no pelvic mass. Note that there is soft tissue stranding in the peri prostatic region which may be indicative of a degree of prostate inflammation. Seminal vesicles appear normal. There is no demonstrable pelvic mass. Other: Appendix appears normal. There is no abscess or ascites in the abdomen or pelvis. There is a minimal ventral hernia containing only fat. Musculoskeletal: There is extensive arthropathy throughout the lumbar spine. There is moderate spinal stenosis at L3-4 and L4-5 due to calcified disc protrusions as well as bony hypertrophy. There are no blastic or lytic bone lesions. There is no intramuscular or abdominal wall lesions. IMPRESSION: 1. Prominent prostate with multiple prostatic calculi. There is soft tissue stranding in the peri prostatic region. Suspect a degree of  prostatitis. No prostatic mass evident. Note that the prostate abuts the inferior aspect of the urinary bladder. These findings warrant clinical assessment as well as PSA correlation. 2. 3 mm calculus lower pole left kidney. No hydronephrosis or renal calculus on either side. The urinary  bladder wall does not appear appreciably thickened. 3. No evident bowel obstruction. There are scattered left-sided colonic diverticula without diverticulitis. No evident abscess in the abdomen pelvis. Appendix appears normal. 4. Aortoiliac and proximal mesenteric arterial atherosclerosis. There are foci of coronary artery calcification. No aneurysm evident. 5. Degenerative change throughout the lumbar spine. There is moderate spinal stenosis at L3-4 and L4-5 due to calcified disc protrusions and bony hypertrophy. 6.  Small ventral hernia containing only fat. 7.  Gallbladder absent. Aortic Atherosclerosis (ICD10-I70.0). Electronically Signed   By: Lowella Grip III M.D.   On: 05/20/2018 08:04    DISCHARGE EXAMINATION: Vitals:   05/24/18 2151 05/25/18 0624 05/25/18 0634 05/25/18 0905  BP: 138/66 120/66  129/71  Pulse: 71 67  66  Resp:  18    Temp:  98 F (36.7 C)    TempSrc:  Oral    SpO2:  96%  96%  Weight:   132.6 kg   Height:       General appearance: alert, cooperative, appears stated age and no distress Resp: clear to auscultation bilaterally Cardio: regular rate and rhythm, S1, S2 normal, no murmur, click, rub or gallop GI: soft, non-tender; bowel sounds normal; no masses,  no organomegaly  DISPOSITION: SNF  Discharge Instructions    Call MD for:  difficulty breathing, headache or visual disturbances   Complete by:  As directed    Call MD for:  extreme fatigue   Complete by:  As directed    Call MD for:  persistant dizziness or light-headedness   Complete by:  As directed    Call MD for:  persistant nausea and vomiting   Complete by:  As directed    Call MD for:  severe uncontrolled pain    Complete by:  As directed    Call MD for:  temperature >100.4   Complete by:  As directed    Discharge instructions   Complete by:  As directed    Please review instructions on the discharge summary  You were cared for by a hospitalist during your hospital stay. If you have any questions about your discharge medications or the care you received while you were in the hospital after you are discharged, you can call the unit and asked to speak with the hospitalist on call if the hospitalist that took care of you is not available. Once you are discharged, your primary care physician will handle any further medical issues. Please note that NO REFILLS for any discharge medications will be authorized once you are discharged, as it is imperative that you return to your primary care physician (or establish a relationship with a primary care physician if you do not have one) for your aftercare needs so that they can reassess your need for medications and monitor your lab values. If you do not have a primary care physician, you can call [phone] for a physician referral.   Increase activity slowly   Complete by:  As directed        Allergies as of 05/25/2018   No Known Allergies     Medication List    STOP taking these medications   HYDROcodone-acetaminophen 5-325 MG tablet Commonly known as:  NORCO/VICODIN   valsartan-hydrochlorothiazide 160-12.5 MG tablet Commonly known as:  DIOVAN-HCT     TAKE these medications   apixaban 5 MG Tabs tablet Commonly known as:  ELIQUIS Take 1 tablet (5 mg total) by mouth 2 (two) times daily.   atorvastatin 20 MG tablet Commonly known  as:  LIPITOR TAKE 1 TABLET DAILY   baclofen 10 MG tablet Commonly known as:  LIORESAL Take 10 mg by mouth 2 (two) times daily.   cephALEXin 500 MG capsule Commonly known as:  KEFLEX Take 1 capsule (500 mg total) by mouth every 6 (six) hours for 8 days. What changed:    how much to take  when to take  this  additional instructions   gabapentin 300 MG capsule Commonly known as:  NEURONTIN Take 300-600 mg by mouth See admin instructions. Take 600 mg by mouth in the morning and 300 mg at 4 PM daily   metoprolol tartrate 25 MG tablet Commonly known as:  LOPRESSOR Take 1 tablet (25 mg total) by mouth 2 (two) times daily. What changed:    how much to take  when to take this   NON FORMULARY CPAP: At bedtime   senna 8.6 MG Tabs tablet Commonly known as:  SENOKOT Take 2 tablets (17.2 mg total) by mouth daily.   traMADol 50 MG tablet Commonly known as:  ULTRAM Take 1 tablet (50 mg total) by mouth every 6 (six) hours as needed for moderate pain.   ULORIC 40 MG tablet Generic drug:  febuxostat Take 1 tablet (40 mg total) by mouth daily.   vitamin B-1 250 MG tablet Take 250 mg by mouth daily.         Contact information for follow-up providers    [name], MD. Schedule an appointment as soon as possible for a visit in 1 week(s).   Specialty:  Internal Medicine Contact information: [address] [place] [address] [phone]        Raynelle Bring, MD. Schedule an appointment as soon as possible for a visit in 2 week(s).   Specialty:  Urology Why:  for prostatitis Contact information: [address] [place] [address] [phone]            Contact information for after-discharge care    Destination    [facility] Preferred SNF .   Service:  Skilled Nursing Contact information: [place] [phone]                  TOTAL DISCHARGE TIME: 89 mins  Bonnielee Haff  [facility] Pager [phone]  05/25/2018, 11:36 AM

## 2018-05-25 NOTE — Progress Notes (Signed)
Clinical Social Worker facilitated patient discharge including contacting patient family and facility to confirm patient discharge plans.  Clinical information faxed to facility and family agreeable with plan.  CSW arranged ambulance transport via [facility] to [facility] . RN to call for report prior to discharge [phone] Room 203.  Clinical Social Worker will sign off for now as social work intervention is no longer needed. Please consult [place] again if new need arises.  Mahamad Windsor LCSW [phone]

## 2018-05-25 NOTE — Progress Notes (Signed)
Called to give report to facility.  Spoke to [employer].   Sent paper work with Corey Harold and a copy with patient.  Discussed with patient medication changes.   No further questions.

## 2018-05-28 LAB — CULTURE, BLOOD (ROUTINE X 2)
CULTURE: NO GROWTH
Culture: NO GROWTH
SPECIAL REQUESTS: ADEQUATE
Special Requests: ADEQUATE

## 2018-06-01 DIAGNOSIS — A4189 Other specified sepsis: Secondary | ICD-10-CM | POA: Diagnosis not present

## 2018-06-01 DIAGNOSIS — R2689 Other abnormalities of gait and mobility: Secondary | ICD-10-CM | POA: Diagnosis not present

## 2018-06-01 DIAGNOSIS — M48061 Spinal stenosis, lumbar region without neurogenic claudication: Secondary | ICD-10-CM | POA: Diagnosis not present

## 2018-06-01 DIAGNOSIS — I480 Paroxysmal atrial fibrillation: Secondary | ICD-10-CM | POA: Diagnosis not present

## 2018-06-01 DIAGNOSIS — G4733 Obstructive sleep apnea (adult) (pediatric): Secondary | ICD-10-CM | POA: Diagnosis not present

## 2018-06-01 DIAGNOSIS — Z950 Presence of cardiac pacemaker: Secondary | ICD-10-CM | POA: Diagnosis not present

## 2018-06-04 DIAGNOSIS — G4733 Obstructive sleep apnea (adult) (pediatric): Secondary | ICD-10-CM | POA: Diagnosis not present

## 2018-06-04 DIAGNOSIS — G629 Polyneuropathy, unspecified: Secondary | ICD-10-CM | POA: Diagnosis not present

## 2018-06-04 DIAGNOSIS — M21372 Foot drop, left foot: Secondary | ICD-10-CM | POA: Diagnosis not present

## 2018-06-04 DIAGNOSIS — Z6835 Body mass index (BMI) 35.0-35.9, adult: Secondary | ICD-10-CM | POA: Diagnosis not present

## 2018-06-04 DIAGNOSIS — I10 Essential (primary) hypertension: Secondary | ICD-10-CM | POA: Diagnosis not present

## 2018-06-04 DIAGNOSIS — D696 Thrombocytopenia, unspecified: Secondary | ICD-10-CM | POA: Diagnosis not present

## 2018-06-04 DIAGNOSIS — N200 Calculus of kidney: Secondary | ICD-10-CM | POA: Diagnosis not present

## 2018-06-04 DIAGNOSIS — I2699 Other pulmonary embolism without acute cor pulmonale: Secondary | ICD-10-CM | POA: Diagnosis not present

## 2018-06-04 DIAGNOSIS — M48061 Spinal stenosis, lumbar region without neurogenic claudication: Secondary | ICD-10-CM | POA: Diagnosis not present

## 2018-06-04 DIAGNOSIS — K439 Ventral hernia without obstruction or gangrene: Secondary | ICD-10-CM | POA: Diagnosis not present

## 2018-06-04 DIAGNOSIS — I480 Paroxysmal atrial fibrillation: Secondary | ICD-10-CM | POA: Diagnosis not present

## 2018-06-04 DIAGNOSIS — K5730 Diverticulosis of large intestine without perforation or abscess without bleeding: Secondary | ICD-10-CM | POA: Diagnosis not present

## 2018-06-04 DIAGNOSIS — I872 Venous insufficiency (chronic) (peripheral): Secondary | ICD-10-CM | POA: Diagnosis not present

## 2018-06-04 DIAGNOSIS — N420 Calculus of prostate: Secondary | ICD-10-CM | POA: Diagnosis not present

## 2018-06-04 DIAGNOSIS — Z7901 Long term (current) use of anticoagulants: Secondary | ICD-10-CM | POA: Diagnosis not present

## 2018-06-04 DIAGNOSIS — N390 Urinary tract infection, site not specified: Secondary | ICD-10-CM | POA: Diagnosis not present

## 2018-06-04 DIAGNOSIS — I700 Atherosclerosis of aorta: Secondary | ICD-10-CM | POA: Diagnosis not present

## 2018-06-04 DIAGNOSIS — M109 Gout, unspecified: Secondary | ICD-10-CM | POA: Diagnosis not present

## 2018-06-04 DIAGNOSIS — Z950 Presence of cardiac pacemaker: Secondary | ICD-10-CM | POA: Diagnosis not present

## 2018-06-04 DIAGNOSIS — I2510 Atherosclerotic heart disease of native coronary artery without angina pectoris: Secondary | ICD-10-CM | POA: Diagnosis not present

## 2018-06-04 DIAGNOSIS — B9620 Unspecified Escherichia coli [E. coli] as the cause of diseases classified elsewhere: Secondary | ICD-10-CM | POA: Diagnosis not present

## 2018-06-04 DIAGNOSIS — D649 Anemia, unspecified: Secondary | ICD-10-CM | POA: Diagnosis not present

## 2018-06-04 DIAGNOSIS — E785 Hyperlipidemia, unspecified: Secondary | ICD-10-CM | POA: Diagnosis not present

## 2018-06-05 DIAGNOSIS — I480 Paroxysmal atrial fibrillation: Secondary | ICD-10-CM | POA: Diagnosis not present

## 2018-06-05 DIAGNOSIS — B9620 Unspecified Escherichia coli [E. coli] as the cause of diseases classified elsewhere: Secondary | ICD-10-CM | POA: Diagnosis not present

## 2018-06-05 DIAGNOSIS — I2510 Atherosclerotic heart disease of native coronary artery without angina pectoris: Secondary | ICD-10-CM | POA: Diagnosis not present

## 2018-06-05 DIAGNOSIS — N390 Urinary tract infection, site not specified: Secondary | ICD-10-CM | POA: Diagnosis not present

## 2018-06-05 DIAGNOSIS — I10 Essential (primary) hypertension: Secondary | ICD-10-CM | POA: Diagnosis not present

## 2018-06-05 DIAGNOSIS — I2699 Other pulmonary embolism without acute cor pulmonale: Secondary | ICD-10-CM | POA: Diagnosis not present

## 2018-06-09 ENCOUNTER — Other Ambulatory Visit: Payer: Self-pay | Admitting: Pharmacist

## 2018-06-09 ENCOUNTER — Other Ambulatory Visit: Payer: Self-pay

## 2018-06-09 DIAGNOSIS — N3000 Acute cystitis without hematuria: Secondary | ICD-10-CM | POA: Diagnosis not present

## 2018-06-09 MED ORDER — APIXABAN 5 MG PO TABS: 5.0000 mg | ORAL_TABLET | Freq: Two times a day (BID) | ORAL | 1 refills | Status: DC

## 2018-06-09 MED ORDER — ATORVASTATIN CALCIUM 20 MG PO TABS: 20.0000 mg | ORAL_TABLET | Freq: Every day | ORAL | 3 refills | Status: DC

## 2018-06-09 MED ORDER — METOPROLOL TARTRATE 25 MG PO TABS: 25.0000 mg | ORAL_TABLET | Freq: Two times a day (BID) | ORAL | 3 refills | Status: DC

## 2018-06-09 NOTE — Telephone Encounter (Signed)
Rx(s) sent to pharmacy electronically.  

## 2018-06-10 DIAGNOSIS — I2510 Atherosclerotic heart disease of native coronary artery without angina pectoris: Secondary | ICD-10-CM | POA: Diagnosis not present

## 2018-06-10 DIAGNOSIS — I2699 Other pulmonary embolism without acute cor pulmonale: Secondary | ICD-10-CM | POA: Diagnosis not present

## 2018-06-10 DIAGNOSIS — I480 Paroxysmal atrial fibrillation: Secondary | ICD-10-CM | POA: Diagnosis not present

## 2018-06-10 DIAGNOSIS — B9620 Unspecified Escherichia coli [E. coli] as the cause of diseases classified elsewhere: Secondary | ICD-10-CM | POA: Diagnosis not present

## 2018-06-10 DIAGNOSIS — N390 Urinary tract infection, site not specified: Secondary | ICD-10-CM | POA: Diagnosis not present

## 2018-06-10 DIAGNOSIS — I10 Essential (primary) hypertension: Secondary | ICD-10-CM | POA: Diagnosis not present

## 2018-06-11 DIAGNOSIS — I2699 Other pulmonary embolism without acute cor pulmonale: Secondary | ICD-10-CM | POA: Diagnosis not present

## 2018-06-11 DIAGNOSIS — I10 Essential (primary) hypertension: Secondary | ICD-10-CM | POA: Diagnosis not present

## 2018-06-11 DIAGNOSIS — I2510 Atherosclerotic heart disease of native coronary artery without angina pectoris: Secondary | ICD-10-CM | POA: Diagnosis not present

## 2018-06-11 DIAGNOSIS — I480 Paroxysmal atrial fibrillation: Secondary | ICD-10-CM | POA: Diagnosis not present

## 2018-06-11 DIAGNOSIS — N390 Urinary tract infection, site not specified: Secondary | ICD-10-CM | POA: Diagnosis not present

## 2018-06-11 DIAGNOSIS — B9620 Unspecified Escherichia coli [E. coli] as the cause of diseases classified elsewhere: Secondary | ICD-10-CM | POA: Diagnosis not present

## 2018-06-13 DIAGNOSIS — I480 Paroxysmal atrial fibrillation: Secondary | ICD-10-CM | POA: Diagnosis not present

## 2018-06-13 DIAGNOSIS — B9620 Unspecified Escherichia coli [E. coli] as the cause of diseases classified elsewhere: Secondary | ICD-10-CM | POA: Diagnosis not present

## 2018-06-13 DIAGNOSIS — N390 Urinary tract infection, site not specified: Secondary | ICD-10-CM | POA: Diagnosis not present

## 2018-06-13 DIAGNOSIS — I2699 Other pulmonary embolism without acute cor pulmonale: Secondary | ICD-10-CM | POA: Diagnosis not present

## 2018-06-13 DIAGNOSIS — I10 Essential (primary) hypertension: Secondary | ICD-10-CM | POA: Diagnosis not present

## 2018-06-13 DIAGNOSIS — I2510 Atherosclerotic heart disease of native coronary artery without angina pectoris: Secondary | ICD-10-CM | POA: Diagnosis not present

## 2018-06-16 DIAGNOSIS — N390 Urinary tract infection, site not specified: Secondary | ICD-10-CM | POA: Diagnosis not present

## 2018-06-16 DIAGNOSIS — I2699 Other pulmonary embolism without acute cor pulmonale: Secondary | ICD-10-CM | POA: Diagnosis not present

## 2018-06-16 DIAGNOSIS — I10 Essential (primary) hypertension: Secondary | ICD-10-CM | POA: Diagnosis not present

## 2018-06-16 DIAGNOSIS — I480 Paroxysmal atrial fibrillation: Secondary | ICD-10-CM | POA: Diagnosis not present

## 2018-06-16 DIAGNOSIS — I2510 Atherosclerotic heart disease of native coronary artery without angina pectoris: Secondary | ICD-10-CM | POA: Diagnosis not present

## 2018-06-16 DIAGNOSIS — B9620 Unspecified Escherichia coli [E. coli] as the cause of diseases classified elsewhere: Secondary | ICD-10-CM | POA: Diagnosis not present

## 2018-06-18 DIAGNOSIS — N390 Urinary tract infection, site not specified: Secondary | ICD-10-CM | POA: Diagnosis not present

## 2018-06-18 DIAGNOSIS — B9620 Unspecified Escherichia coli [E. coli] as the cause of diseases classified elsewhere: Secondary | ICD-10-CM | POA: Diagnosis not present

## 2018-06-18 DIAGNOSIS — I2699 Other pulmonary embolism without acute cor pulmonale: Secondary | ICD-10-CM | POA: Diagnosis not present

## 2018-06-18 DIAGNOSIS — I480 Paroxysmal atrial fibrillation: Secondary | ICD-10-CM | POA: Diagnosis not present

## 2018-06-18 DIAGNOSIS — I10 Essential (primary) hypertension: Secondary | ICD-10-CM | POA: Diagnosis not present

## 2018-06-18 DIAGNOSIS — I2510 Atherosclerotic heart disease of native coronary artery without angina pectoris: Secondary | ICD-10-CM | POA: Diagnosis not present

## 2018-06-25 ENCOUNTER — Telehealth (INDEPENDENT_AMBULATORY_CARE_PROVIDER_SITE_OTHER): Payer: Self-pay

## 2018-06-25 ENCOUNTER — Telehealth (INDEPENDENT_AMBULATORY_CARE_PROVIDER_SITE_OTHER): Payer: Self-pay | Admitting: Orthopedic Surgery

## 2018-06-25 NOTE — Telephone Encounter (Signed)
I called to get patient scheduled for an appt to see Dr Sharol Given for re-evaluation for his medication but he will call back and get it scheduled.

## 2018-06-25 NOTE — Telephone Encounter (Signed)
I called to patient; need to make an appt to come in for medication re-eval.

## 2018-06-25 NOTE — Telephone Encounter (Signed)
Patient called needing Rx refilled (Uloric) 40mg . Patient said the pharmacy called to get Rx refilled as well. The number to contact patient is [phone]

## 2018-06-27 ENCOUNTER — Other Ambulatory Visit: Payer: Self-pay | Admitting: *Deleted

## 2018-06-27 MED ORDER — VALSARTAN-HYDROCHLOROTHIAZIDE 160-12.5 MG PO TABS: 1.0000 | ORAL_TABLET | Freq: Every day | ORAL | 3 refills | Status: DC

## 2018-06-27 NOTE — Telephone Encounter (Signed)
REFILLED VALSARTAN HCT 160/12.5 MG   REVIEWED WITH DR [id number]   MEDICATION  DISCONTINUE OFF MEDICATION LIST IN ERROR.

## 2018-06-30 DIAGNOSIS — N3000 Acute cystitis without hematuria: Secondary | ICD-10-CM | POA: Diagnosis not present

## 2018-07-01 ENCOUNTER — Telehealth: Payer: Self-pay | Admitting: Pulmonary Disease

## 2018-07-01 DIAGNOSIS — Z9989 Dependence on other enabling machines and devices: Principal | ICD-10-CM

## 2018-07-01 DIAGNOSIS — M549 Dorsalgia, unspecified: Secondary | ICD-10-CM | POA: Diagnosis not present

## 2018-07-01 DIAGNOSIS — G4733 Obstructive sleep apnea (adult) (pediatric): Secondary | ICD-10-CM

## 2018-07-01 DIAGNOSIS — G894 Chronic pain syndrome: Secondary | ICD-10-CM | POA: Diagnosis not present

## 2018-07-01 DIAGNOSIS — M47816 Spondylosis without myelopathy or radiculopathy, lumbar region: Secondary | ICD-10-CM | POA: Diagnosis not present

## 2018-07-01 DIAGNOSIS — M889 Osteitis deformans of unspecified bone: Secondary | ICD-10-CM | POA: Diagnosis not present

## 2018-07-01 NOTE — Telephone Encounter (Signed)
Auto CPAP 10 to 14 cm Download in 1 month

## 2018-07-01 NOTE — Telephone Encounter (Signed)
Called and spoke with patient he stated that he is now eligible for a new CPAP machine and he would like for an order to be sent in with new supplies to [facility].   RA please advise, thank you.

## 2018-07-02 ENCOUNTER — Ambulatory Visit (INDEPENDENT_AMBULATORY_CARE_PROVIDER_SITE_OTHER): Payer: Medicare Other | Admitting: Physician Assistant

## 2018-07-02 ENCOUNTER — Encounter (INDEPENDENT_AMBULATORY_CARE_PROVIDER_SITE_OTHER): Payer: Self-pay | Admitting: Physician Assistant

## 2018-07-02 VITALS — Ht 75.0 in | Wt 292.4 lb

## 2018-07-02 DIAGNOSIS — M1A0710 Idiopathic chronic gout, right ankle and foot, without tophus (tophi): Secondary | ICD-10-CM

## 2018-07-02 DIAGNOSIS — I87323 Chronic venous hypertension (idiopathic) with inflammation of bilateral lower extremity: Secondary | ICD-10-CM

## 2018-07-02 DIAGNOSIS — M21371 Foot drop, right foot: Secondary | ICD-10-CM | POA: Diagnosis not present

## 2018-07-02 DIAGNOSIS — M21372 Foot drop, left foot: Secondary | ICD-10-CM

## 2018-07-02 MED ORDER — ULORIC 40 MG PO TABS: 40.0000 mg | ORAL_TABLET | Freq: Every day | ORAL | 3 refills | Status: DC

## 2018-07-02 NOTE — Progress Notes (Signed)
Office Visit Note   Patient: Cameron Sharp           Date of Birth: Sep 13, 1935           MRN: [id number] Visit Date: 07/02/2018              Requested by: [name], [address] [place], [place] [address] PCP: [name], MD  Chief Complaint  Patient presents with  . Right Foot - Pain      HPI: The patient is a 82 yo gentleman here with his wife for follow up of his right foot gout. He reports the Uloric is controlling his symptoms very well and he has not had a recent gout attack. He is wearing Vive silver compression socks to help control his venous stasis.   Assessment & Plan: Visit Diagnoses:  1. Idiopathic chronic gout of right foot without tophus   2. Foot drop, bilateral   3. Chronic venous hypertension (idiopathic) with inflammation of bilateral lower extremity     Plan: Will recheck the patient's uric acid level today and refill Uloric. Will call him with results when available and any changes to the medication or dose at that time.   Follow-Up Instructions: Return in about 1 year (around 07/03/2019).   Ortho Exam  Patient is alert, oriented, no adenopathy, well-dressed, normal affect, normal respiratory effort. The patient has bilateral lower extremity venous stasis changes.  He has bilateral foot drop.  There are no signs of gout currently.  No cellulitis.  No ulcers.  He has palpable pedal pulses.  Imaging: No results found. No images are attached to the encounter.  Labs: Lab Results  Component Value Date   REPTSTATUS 05/28/2018 FINAL 05/23/2018   CULT  05/23/2018    NO GROWTH 5 DAYS Performed at [facility], [place] [address] [place], [place] [address]    [place] 05/19/2018     Lab Results  Component Value Date   ALBUMIN 3.5 05/19/2018   ALBUMIN 3.2 (L) 11/08/2017   ALBUMIN 4.2 10/30/2016    Body mass index is 36.55 kg/m.  Orders:  Orders Placed This Encounter  Procedures  . Uric acid   Meds  ordered this encounter  Medications  . ULORIC 40 MG tablet    Sig: Take 1 tablet (40 mg total) by mouth daily.    Dispense:  90 tablet    Refill:  3     Procedures: No procedures performed  Clinical Data: No additional findings.  ROS:  All other systems negative, except as noted in the HPI. Review of Systems  Objective: Vital Signs: Ht 6\' 3"  (1.905 m)   Wt 292 lb 6.4 oz (132.6 kg)   BMI 36.55 kg/m   Specialty Comments:  No specialty comments available.  PMFS History: Patient Active Problem List   Diagnosis Date Noted  . Sepsis ([place]) 05/20/2018  . Severe sepsis ([place]) 05/20/2018  . Acute cystitis without hematuria   . AKI (acute kidney injury) ([place])   . Hemoptysis 11/07/2017  . Paget disease of bone   . Atrial fibrillation, chronic   . Paget disease, extra mammary 05/30/2017  . Varicose veins of leg with pain 06/07/2015  . Venous insufficiency of right leg 05/07/2015  . Morbid obesity ([place]) 11/13/2014  . Degenerative disc disease 11/29/2013  . Cellulitis of left leg 07/24/2013  . Pacemaker -[place] 2010 05/01/2013  . Hyperlipidemia 05/01/2013  . OSA on CPAP 05/01/2013  . Essential  hypertension 05/01/2013  . Severe obesity (BMI 35.0-35.9 with comorbidity) ([place]) 05/01/2013  . Diarrhea 05/01/2013  . Permanent atrial fibrillation 10/10/2012   Past Medical History:  Diagnosis Date  . Arthritis    back and neck  . Atrial fibrillation ([place]) 10/10/2012  . Chronic back pain   . Gout   . HTN (hypertension) 05/01/2013  . Hyperlipidemia   . Hypertension   . OSA on CPAP    wears CPAP nightly  . Pacemaker   . Pacemaker -[place] 2010 05/01/2013  . Paget disease, extramammary    left groin  . Permanent atrial fibrillation     Family History  Problem Relation Age of Onset  . Stomach cancer Father   . Lung cancer Maternal Grandfather   . Breast cancer Paternal Grandmother   . Prostate cancer Paternal Grandfather       Past Surgical History:  Procedure Laterality Date  . carpel tunnel surgery Right 2012  . CHOLECYSTECTOMY    . COLONOSCOPY    . CYST EXCISION Right 05/08/2017   Procedure: REEXCISION OF EXTRA MAMARY PAGETS DISEASE OF RIGHT GROIN;  Surgeon: [name], DO;  Location: [facility];  Service: Plastics;  Laterality: Right;  . EXCISION MASS LOWER EXTREMETIES Left 09/26/2017   Procedure: Excision of left groin extra mammary Paget's disease;  Surgeon: [name], DO;  Location: [facility];  Service: Plastics;  Laterality: Left;  . EXCISION MASS LOWER EXTREMETIES Left 10/22/2017   Procedure: RE-EXCISION OF LEFT GROIN EXTRA MAMMARY PAGET'S DISEASE;  Surgeon: [name], DO;  Location: [facility];  Service: Plastics;  Laterality: Left;  . GALLBLADDER SURGERY  12/2006  . LESION EXCISION WITH COMPLEX REPAIR Bilateral 07/31/2017   Procedure: BIOPSY OF BILATERAL GROIN  CHANGING SKIN LESIONS;  Surgeon: [name], DO;  Location: [facility];  Service: Plastics;  Laterality: Bilateral;  . LIPOMA EXCISION N/A 11/01/2016   Procedure: EXCISION OF SEBACEOUS CYST ON BACK;  Surgeon: [name], DO;  Location: [facility];  Service: Plastics;  Laterality: N/A;  . MASS EXCISION N/A 11/01/2016   Procedure: EXCISION OF RIGHT GROIN SKIN LESION;  Surgeon: [name], DO;  Location: [facility];  Service: Plastics;  Laterality: N/A;  . MASS EXCISION Right 11/15/2016   Procedure: EXCISION POSITIVE MARGIN RIGHT GROIN;  Surgeon: [name], DO;  Location: [facility];  Service: Plastics;  Laterality: Right;  . MASS EXCISION Right 04/04/2017   Procedure: REEXCISION OF EXTRA MAMARY PAGETS SKIN DISEASE OF RIGHT GROIN;  Surgeon: [name], DO;  Location: [place] [place];  Service: Plastics;  Laterality: Right;  . NM MYOCAR PERF WALL MOTION  05/22/07   no  significant ischemia  . PERMANENT PACEMAKER INSERTION  01/09/2009   [facility]  . TONSILLECTOMY     age 76  . US ECHOCARDIOGRAPHY  11/24/07   mild LVH,LA & RA mod to severely dilated,mild mitral annular ca+,mild TR,mild Pulmonary hypertensio,AOV mod. sclerotic,mild AI w/root dilatation and ca+.   Social History   Occupational History  . Not on file  Tobacco Use  . Smoking status: Former Smoker    Packs/day: 3.00    Years: 13.00    Pack years: 39.00    Last attempt to quit: 07/23/1976    Years since quitting: 41.9  . Smokeless tobacco: Never Used  Substance and Sexual Activity  . Alcohol use: Yes    Alcohol/week:  0.0 standard drinks    Comment: 2oz scotch daily  . Drug use: No  . Sexual activity: Not on file

## 2018-07-02 NOTE — Telephone Encounter (Signed)
Order placed for new CPAP machine, pt notified. Nothing further is needed.

## 2018-07-03 ENCOUNTER — Telehealth (INDEPENDENT_AMBULATORY_CARE_PROVIDER_SITE_OTHER): Payer: Self-pay

## 2018-07-03 DIAGNOSIS — L821 Other seborrheic keratosis: Secondary | ICD-10-CM | POA: Diagnosis not present

## 2018-07-03 DIAGNOSIS — D2261 Melanocytic nevi of right upper limb, including shoulder: Secondary | ICD-10-CM | POA: Diagnosis not present

## 2018-07-03 DIAGNOSIS — L570 Actinic keratosis: Secondary | ICD-10-CM | POA: Diagnosis not present

## 2018-07-03 DIAGNOSIS — D692 Other nonthrombocytopenic purpura: Secondary | ICD-10-CM | POA: Diagnosis not present

## 2018-07-03 DIAGNOSIS — C44212 Basal cell carcinoma of skin of right ear and external auricular canal: Secondary | ICD-10-CM | POA: Diagnosis not present

## 2018-07-03 DIAGNOSIS — D2262 Melanocytic nevi of left upper limb, including shoulder: Secondary | ICD-10-CM | POA: Diagnosis not present

## 2018-07-03 DIAGNOSIS — L814 Other melanin hyperpigmentation: Secondary | ICD-10-CM | POA: Diagnosis not present

## 2018-07-03 DIAGNOSIS — D485 Neoplasm of uncertain behavior of skin: Secondary | ICD-10-CM | POA: Diagnosis not present

## 2018-07-03 DIAGNOSIS — D225 Melanocytic nevi of trunk: Secondary | ICD-10-CM | POA: Diagnosis not present

## 2018-07-03 LAB — URIC ACID: Uric Acid, Serum: 3.9 mg/dL — ABNORMAL LOW (ref 4.0–8.0)

## 2018-07-03 NOTE — Telephone Encounter (Signed)
I called patient and left message to return call for his lab results.

## 2018-07-07 ENCOUNTER — Telehealth (INDEPENDENT_AMBULATORY_CARE_PROVIDER_SITE_OTHER): Payer: Self-pay | Admitting: Orthopedic Surgery

## 2018-07-07 NOTE — Telephone Encounter (Signed)
Cameron Sharp left a message stating that they have tried several times to reach our office in regards to the patient's Uloric 40mg .  They are wanting to know if the generic brand is okay because that has a copay of $10.00 and the name is considerably higher.  Ref: [id number].  UV#[phone].  Thank you.

## 2018-07-07 NOTE — Telephone Encounter (Signed)
I called and advised generic ok.  To call with any other questions.

## 2018-07-14 DIAGNOSIS — E785 Hyperlipidemia, unspecified: Secondary | ICD-10-CM | POA: Diagnosis not present

## 2018-07-14 DIAGNOSIS — I4891 Unspecified atrial fibrillation: Secondary | ICD-10-CM | POA: Diagnosis not present

## 2018-07-21 DIAGNOSIS — I10 Essential (primary) hypertension: Secondary | ICD-10-CM | POA: Diagnosis not present

## 2018-07-21 DIAGNOSIS — I4891 Unspecified atrial fibrillation: Secondary | ICD-10-CM | POA: Diagnosis not present

## 2018-07-21 DIAGNOSIS — E785 Hyperlipidemia, unspecified: Secondary | ICD-10-CM | POA: Diagnosis not present

## 2018-07-21 DIAGNOSIS — I872 Venous insufficiency (chronic) (peripheral): Secondary | ICD-10-CM | POA: Diagnosis not present

## 2018-08-12 DIAGNOSIS — Z85828 Personal history of other malignant neoplasm of skin: Secondary | ICD-10-CM | POA: Diagnosis not present

## 2018-08-12 DIAGNOSIS — C44212 Basal cell carcinoma of skin of right ear and external auricular canal: Secondary | ICD-10-CM | POA: Diagnosis not present

## 2018-08-19 DIAGNOSIS — Z4802 Encounter for removal of sutures: Secondary | ICD-10-CM | POA: Diagnosis not present

## 2018-08-25 ENCOUNTER — Encounter (INDEPENDENT_AMBULATORY_CARE_PROVIDER_SITE_OTHER): Payer: Self-pay | Admitting: Orthopedic Surgery

## 2018-08-25 ENCOUNTER — Ambulatory Visit (INDEPENDENT_AMBULATORY_CARE_PROVIDER_SITE_OTHER): Payer: Medicare Other | Admitting: Physician Assistant

## 2018-08-25 ENCOUNTER — Ambulatory Visit (INDEPENDENT_AMBULATORY_CARE_PROVIDER_SITE_OTHER): Payer: Medicare Other

## 2018-08-25 VITALS — Ht 75.0 in | Wt 292.4 lb

## 2018-08-25 DIAGNOSIS — S8011XA Contusion of right lower leg, initial encounter: Secondary | ICD-10-CM | POA: Diagnosis not present

## 2018-08-25 DIAGNOSIS — M25561 Pain in right knee: Secondary | ICD-10-CM | POA: Diagnosis not present

## 2018-08-25 DIAGNOSIS — G8929 Other chronic pain: Secondary | ICD-10-CM

## 2018-08-25 NOTE — Progress Notes (Signed)
Office Visit Note   Patient: Cameron Sharp           Date of Birth: 1936/07/17           MRN: [id number] Visit Date: 08/25/2018              Requested by: [name], [address] [place], [place] [address] PCP: [name], MD  Chief Complaint  Patient presents with  . Right Knee - Pain      HPI: The patient is a 83 yo gentleman who is seen for a right lower leg hematoma. He reports he fell against the right leg when he tripped on a rug at home and noted swelling over the right medial knee and posterior knee and thigh following the fall. He is on Eliquis.  He has been wearing his compression socks for his venous insufficiency and notes swelling above the knee where the sock stops. He reports the swelling is gradually improving. He has been using ice to the area and walks with a walker.   Assessment & Plan: Visit Diagnoses:  1. Chronic pain of right knee     Plan: Counseled patient no fracture or dislocation or effusion of the joint. Recommend continue compression stockings and add Ace to the right knee area whenever up and around for the next few weeks. Activities to tolerance. Follow up prn for questions or concerns.   Follow-Up Instructions: Return if symptoms worsen or fail to improve.   Ortho Exam  Patient is alert, oriented, no adenopathy, well-dressed, normal affect, normal respiratory effort. Right knee without effusion. No instability. Hematoma about the posterior and medial knee and calf within the muscle tissue. Maturing ecchymosis.   Imaging: Xr Knee 1-2 Views Right  Result Date: 08/25/2018 Moderate to severe osteoarthritis medial and patello femoral joint space> lateral joint. No signs of fracture or dislocation of patella  No images are attached to the encounter.  Labs: Lab Results  Component Value Date   LABURIC 3.9 (L) 07/02/2018   REPTSTATUS 05/28/2018 FINAL 05/23/2018   CULT  05/23/2018    NO GROWTH 5 DAYS Performed at [facility], [place] [address], [place], [place] [address]    [place] 05/19/2018     Lab Results  Component Value Date   ALBUMIN 3.5 05/19/2018   ALBUMIN 3.2 (L) 11/08/2017   ALBUMIN 4.2 10/30/2016   LABURIC 3.9 (L) 07/02/2018    Body mass index is 36.55 kg/m.  Orders:  Orders Placed This Encounter  Procedures  . XR Knee 1-2 Views Right   No orders of the defined types were placed in this encounter.    Procedures: No procedures performed  Clinical Data: No additional findings.  ROS:  All other systems negative, except as noted in the HPI. Review of Systems  Objective: Vital Signs: Ht 6\' 3"  (1.905 m)   Wt 292 lb 6.4 oz (132.6 kg)   BMI 36.55 kg/m   Specialty Comments:  No specialty comments available.  PMFS History: Patient Active Problem List   Diagnosis Date Noted  . Sepsis ([place]) 05/20/2018  . Severe sepsis ([place]) 05/20/2018  . Acute cystitis without hematuria   . AKI (acute kidney injury) ([place])   . Hemoptysis 11/07/2017  . Paget disease of bone   . Atrial fibrillation, chronic   . Paget disease, extra mammary 05/30/2017  . Varicose veins of leg with pain 06/07/2015  . Venous insufficiency of right leg 05/07/2015  . Morbid obesity ([place])  11/13/2014  . Degenerative disc disease 11/29/2013  . Cellulitis of left leg 07/24/2013  . Pacemaker -[place] 2010 05/01/2013  . Hyperlipidemia 05/01/2013  . OSA on CPAP 05/01/2013  . Essential hypertension 05/01/2013  . Severe obesity (BMI 35.0-35.9 with comorbidity) ([place]) 05/01/2013  . Diarrhea 05/01/2013  . Permanent atrial fibrillation 10/10/2012   Past Medical History:  Diagnosis Date  . Arthritis    back and neck  . Atrial fibrillation ([place]) 10/10/2012  . Chronic back pain   . Gout   . HTN (hypertension) 05/01/2013  . Hyperlipidemia   . Hypertension   . OSA on CPAP    wears CPAP nightly  . Pacemaker   . Pacemaker -[place] 2010 05/01/2013  .  Paget disease, extramammary    left groin  . Permanent atrial fibrillation     Family History  Problem Relation Age of Onset  . Stomach cancer Father   . Lung cancer Maternal Grandfather   . Breast cancer Paternal Grandmother   . Prostate cancer Paternal Grandfather     Past Surgical History:  Procedure Laterality Date  . carpel tunnel surgery Right 2012  . CHOLECYSTECTOMY    . COLONOSCOPY    . CYST EXCISION Right 05/08/2017   Procedure: REEXCISION OF EXTRA MAMARY PAGETS DISEASE OF RIGHT GROIN;  Surgeon: [name], DO;  Location: [facility];  Service: Plastics;  Laterality: Right;  . EXCISION MASS LOWER EXTREMETIES Left 09/26/2017   Procedure: Excision of left groin extra mammary Paget's disease;  Surgeon: [name], DO;  Location: [facility];  Service: Plastics;  Laterality: Left;  . EXCISION MASS LOWER EXTREMETIES Left 10/22/2017   Procedure: RE-EXCISION OF LEFT GROIN EXTRA MAMMARY PAGET'S DISEASE;  Surgeon: [name], DO;  Location: [facility];  Service: Plastics;  Laterality: Left;  . GALLBLADDER SURGERY  12/2006  . LESION EXCISION WITH COMPLEX REPAIR Bilateral 07/31/2017   Procedure: BIOPSY OF BILATERAL GROIN  CHANGING SKIN LESIONS;  Surgeon: [name], DO;  Location: [facility];  Service: Plastics;  Laterality: Bilateral;  . LIPOMA EXCISION N/A 11/01/2016   Procedure: EXCISION OF SEBACEOUS CYST ON BACK;  Surgeon: [name], DO;  Location: [facility];  Service: Plastics;  Laterality: N/A;  . MASS EXCISION N/A 11/01/2016   Procedure: EXCISION OF RIGHT GROIN SKIN LESION;  Surgeon: [name], DO;  Location: [facility];  Service: Plastics;  Laterality: N/A;  . MASS EXCISION Right 11/15/2016   Procedure: EXCISION POSITIVE MARGIN RIGHT GROIN;  Surgeon: [name], DO;  Location: [facility];  Service: Plastics;  Laterality:  Right;  . MASS EXCISION Right 04/04/2017   Procedure: REEXCISION OF EXTRA MAMARY PAGETS SKIN DISEASE OF RIGHT GROIN;  Surgeon: [name], DO;  Location: [facility];  Service: Plastics;  Laterality: Right;  . NM MYOCAR PERF WALL MOTION  05/22/07   no significant ischemia  . PERMANENT PACEMAKER INSERTION  01/09/2009   [facility]  . TONSILLECTOMY     age 84  . US ECHOCARDIOGRAPHY  11/24/07   mild LVH,LA & RA mod to severely dilated,mild mitral annular ca+,mild TR,mild Pulmonary hypertensio,AOV mod. sclerotic,mild AI w/root dilatation and ca+.   Social History   Occupational History  . Not on file  Tobacco Use  . Smoking status: Former Smoker    Packs/day: 3.00    Years: 13.00    Pack years: 39.00  Last attempt to quit: 07/23/1976    Years since quitting: 42.1  . Smokeless tobacco: Never Used  Substance and Sexual Activity  . Alcohol use: Yes    Alcohol/week: 0.0 standard drinks    Comment: 2oz scotch daily  . Drug use: No  . Sexual activity: Not on file

## 2018-09-02 ENCOUNTER — Ambulatory Visit: Payer: Medicare Other | Admitting: Pulmonary Disease

## 2018-09-05 ENCOUNTER — Encounter: Payer: Self-pay | Admitting: Pulmonary Disease

## 2018-09-05 ENCOUNTER — Ambulatory Visit (INDEPENDENT_AMBULATORY_CARE_PROVIDER_SITE_OTHER): Payer: Medicare Other | Admitting: Pulmonary Disease

## 2018-09-05 DIAGNOSIS — G4733 Obstructive sleep apnea (adult) (pediatric): Secondary | ICD-10-CM

## 2018-09-05 DIAGNOSIS — Z9989 Dependence on other enabling machines and devices: Secondary | ICD-10-CM | POA: Diagnosis not present

## 2018-09-05 DIAGNOSIS — I10 Essential (primary) hypertension: Secondary | ICD-10-CM

## 2018-09-05 NOTE — Assessment & Plan Note (Signed)
CPAP supplies will be renewed for a year. He has a few treatment emergent centrals, average pressure is 12 cm, will continue auto settings 10 to 14 cm if these do not resolve by next download then consider changing to fixed pressure  Weight loss encouraged, compliance with goal of at least 4-6 hrs every night is the expectation. Advised against medications with sedative side effects Cautioned against driving when sleepy - understanding that sleepiness will vary on a day to day basis

## 2018-09-05 NOTE — Patient Instructions (Signed)
CPAP supplies will be renewed for a year. Continue current settings.  Blood pressure was low today, will check a few times and touch base with PCP/cardiology about reducing dose if this persists

## 2018-09-05 NOTE — Assessment & Plan Note (Signed)
Blood pressure was low today, he will check a few times and touch base with PCP/cardiology about reducing dose if this persists

## 2018-09-05 NOTE — Progress Notes (Signed)
   Subjective:    Patient ID: Cameron Sharp, male    DOB: Jun 05, 1936, 83 y.o.   MRN: [id number]  HPI  83 yo remote smoker for follow-up of OSA &  pulmonary embolism. This was diagnosed 10/2017 when he presented with hemoptysis and cough following decreased mobility due to surgery for Paget's disease involving groin.  He had stopped his Coumadin around the surgery  Past medical history of a bad back and he sleeps in a recliner, OSA on CPAP, hypertension, atrial fibrillation on Coumadin  Chief Complaint  Patient presents with  . Follow-up    He has changed from Coumadin to Eliquis. Now has decreased mobility, uses a walker due to injury to right leg, following with orthopedics. Blood pressure appears low today but he denies dizziness or visual problems.  We repeated home sleep test in 6-20 19 which still showed moderate OSA, he was changed to effort of 30 fullface mask which he really likes. We got him a new auto CPAP machine in 2019 set at 10 to 14 cm, no problems with mask or pressure Download was reviewed which shows residual AHI of 10/hour with centrals 8/hour with average pressure of 12 cm, excellent compliance and moderate leak  He developed URI symptoms, clear sputum and would like to be checked for this today  Significant tests/ events reviewed  NPSG 2009 >> AHI 38/h, wt 350 lbs 12/2017 HST >> AHI 27/h  Review of Systems neg for any significant sore throat, dysphagia, itching, sneezing, nasal congestion or excess/ purulent secretions, fever, chills, sweats, unintended wt loss, pleuritic or exertional cp, hempoptysis, orthopnea pnd or change in chronic leg swelling. Also denies presyncope, palpitations, heartburn, abdominal pain, nausea, vomiting, diarrhea or change in bowel or urinary habits, dysuria,hematuria, rash, arthralgias, visual complaints, headache, numbness weakness or ataxia.     Objective:   Physical Exam   Gen. Pleasant, obese, in no distress ENT - no  lesions, no post nasal drip Neck: No JVD, no thyromegaly, no carotid bruits Lungs: no use of accessory muscles, no dullness to percussion, decreased without rales or rhonchi  Cardiovascular: Rhythm regular, heart sounds  normal, no murmurs or gallops, no peripheral edema Musculoskeletal: No deformities, no cyanosis or clubbing , no tremors        Assessment & Plan:

## 2018-09-30 DIAGNOSIS — G894 Chronic pain syndrome: Secondary | ICD-10-CM | POA: Diagnosis not present

## 2018-09-30 DIAGNOSIS — Z79899 Other long term (current) drug therapy: Secondary | ICD-10-CM | POA: Diagnosis not present

## 2018-09-30 DIAGNOSIS — M47816 Spondylosis without myelopathy or radiculopathy, lumbar region: Secondary | ICD-10-CM | POA: Diagnosis not present

## 2018-09-30 DIAGNOSIS — Z79891 Long term (current) use of opiate analgesic: Secondary | ICD-10-CM | POA: Diagnosis not present

## 2018-09-30 DIAGNOSIS — M889 Osteitis deformans of unspecified bone: Secondary | ICD-10-CM | POA: Diagnosis not present

## 2018-10-22 ENCOUNTER — Other Ambulatory Visit (HOSPITAL_COMMUNITY): Payer: Medicare Other

## 2018-10-29 ENCOUNTER — Encounter: Payer: Medicare Other | Admitting: Cardiovascular Disease

## 2018-11-25 DIAGNOSIS — M109 Gout, unspecified: Secondary | ICD-10-CM | POA: Diagnosis not present

## 2018-11-25 DIAGNOSIS — I4821 Permanent atrial fibrillation: Secondary | ICD-10-CM | POA: Diagnosis not present

## 2018-11-25 DIAGNOSIS — Z1339 Encounter for screening examination for other mental health and behavioral disorders: Secondary | ICD-10-CM | POA: Diagnosis not present

## 2018-11-25 DIAGNOSIS — G4733 Obstructive sleep apnea (adult) (pediatric): Secondary | ICD-10-CM | POA: Diagnosis not present

## 2018-11-25 DIAGNOSIS — M545 Low back pain: Secondary | ICD-10-CM | POA: Diagnosis not present

## 2018-11-25 DIAGNOSIS — Z1331 Encounter for screening for depression: Secondary | ICD-10-CM | POA: Diagnosis not present

## 2018-12-08 ENCOUNTER — Telehealth: Payer: Self-pay | Admitting: Cardiovascular Disease

## 2018-12-08 NOTE — Telephone Encounter (Signed)
New message    *STAT* If patient is at the pharmacy, call can be transferred to refill team.   1. Which medications need to be refilled? (please list name of each medication and dose if known)apixaban (ELIQUIS) 5 MG TABS tablet  2. Which pharmacy/location (including street and city if local pharmacy) is medication to be sent to? [facility] [address], [place], [place]  3. Do they need a 30 day or 90 day supply? [place]

## 2018-12-08 NOTE — Telephone Encounter (Signed)
Follow up    Pt is wondering about his prescription. He says the pharmacy has not gotten the refill request

## 2018-12-09 MED ORDER — APIXABAN 5 MG PO TABS: 5.0000 mg | ORAL_TABLET | Freq: Two times a day (BID) | ORAL | 1 refills | Status: DC

## 2018-12-09 NOTE — Telephone Encounter (Signed)
[facility] called [facility] and stated [facility]  Phone Number is [phone]

## 2018-12-09 NOTE — Telephone Encounter (Signed)
Follow up   The medication [facility] to [facility] [address] of [address].

## 2018-12-09 NOTE — Telephone Encounter (Signed)
82yo, 131.5kg, Scr 1.00 on 05/25/18 Last OV 04/24/18 Indication afib with history of PE  [facility].

## 2019-01-14 ENCOUNTER — Telehealth (HOSPITAL_COMMUNITY): Payer: Self-pay | Admitting: Radiology

## 2019-01-14 NOTE — Telephone Encounter (Signed)
Called patient to schedule echocardiogram-patient would like to wait a few weeks before scheduling. We will call back in late July to reassess.

## 2019-02-13 ENCOUNTER — Encounter (HOSPITAL_COMMUNITY): Payer: Self-pay | Admitting: Cardiovascular Disease

## 2019-02-24 ENCOUNTER — Telehealth: Payer: Self-pay | Admitting: Cardiovascular Disease

## 2019-02-24 NOTE — Telephone Encounter (Signed)
New Message   Patient is calling in reference to the echocardiogram that has been ordered for him. He has been putting off this test for sometime and is wondering how important it is for him to have the echo. He states that he is high risk and does not want to put his life in jeopardy. Please call to discuss.

## 2019-02-24 NOTE — Telephone Encounter (Signed)
Spoke to patient advised Dr.Croitoru will discuss scheduling a echo at upcoming appointment.

## 2019-02-24 NOTE — Telephone Encounter (Signed)
Spoke to patient he stated he wants to wait to have echo.Stated he is feeling good and will discuss at visit with Dr.Croitoru 8/24.

## 2019-02-24 NOTE — Telephone Encounter (Signed)
Ok, will discuss then

## 2019-03-06 ENCOUNTER — Other Ambulatory Visit: Payer: Self-pay

## 2019-03-06 ENCOUNTER — Other Ambulatory Visit: Payer: Self-pay | Admitting: Pharmacist

## 2019-03-06 MED ORDER — APIXABAN 5 MG PO TABS: 5.0000 mg | ORAL_TABLET | Freq: Two times a day (BID) | ORAL | 1 refills | Status: DC

## 2019-03-06 MED ORDER — METOPROLOL TARTRATE 25 MG PO TABS: 25.0000 mg | ORAL_TABLET | Freq: Two times a day (BID) | ORAL | 3 refills | Status: AC

## 2019-03-13 DIAGNOSIS — Z23 Encounter for immunization: Secondary | ICD-10-CM | POA: Diagnosis not present

## 2019-03-16 ENCOUNTER — Ambulatory Visit (INDEPENDENT_AMBULATORY_CARE_PROVIDER_SITE_OTHER): Payer: Medicare Other | Admitting: Cardiovascular Disease

## 2019-03-16 ENCOUNTER — Other Ambulatory Visit: Payer: Self-pay

## 2019-03-16 VITALS — BP 105/62 | HR 70 | Temp 97.2°F | Ht 75.0 in | Wt 300.0 lb

## 2019-03-16 DIAGNOSIS — Z950 Presence of cardiac pacemaker: Secondary | ICD-10-CM

## 2019-03-16 DIAGNOSIS — I10 Essential (primary) hypertension: Secondary | ICD-10-CM | POA: Diagnosis not present

## 2019-03-16 DIAGNOSIS — Z7901 Long term (current) use of anticoagulants: Secondary | ICD-10-CM | POA: Diagnosis not present

## 2019-03-16 DIAGNOSIS — G4733 Obstructive sleep apnea (adult) (pediatric): Secondary | ICD-10-CM

## 2019-03-16 DIAGNOSIS — I4821 Permanent atrial fibrillation: Secondary | ICD-10-CM | POA: Diagnosis not present

## 2019-03-16 DIAGNOSIS — Z6837 Body mass index (BMI) 37.0-37.9, adult: Secondary | ICD-10-CM | POA: Diagnosis not present

## 2019-03-16 NOTE — Patient Instructions (Signed)

## 2019-03-16 NOTE — Progress Notes (Signed)
Cardiology Office Note    Date:  03/17/2019   ID:  Cameron Sharp, DOB [birth year], MRN [id number]  PCP:  [name], MD  Cardiologist: Shelva Majestic, M.D.;  Sanda Klein, MD   Chief Complaint  Patient presents with  . Atrial Fibrillation  . Pacemaker Check    History of Present Illness:  Cameron Sharp is a 83 y.o. male with atrial fibrillation with slow ventricular response and a single-chamber [employer]. Jude Zephyr pacemaker implanted in 2010, as well severe obesity, obstructive sleep apnea, hypertension and hyperlipidemia.   He is doing well from a cardiac point of view. The patient specifically denies any chest pain at rest exertion, dyspnea at rest or with exertion, orthopnea, paroxysmal nocturnal dyspnea, syncope, palpitations, focal neurological deficits, intermittent claudication, lower extremity edema, unexplained weight gain, cough, hemoptysis or wheezing.  He continues to have problems with back pain and unfortunately injections have not helped.  He does not plan to have surgery.  Dr. Claiborne Billings recommended an echocardiogram, but Mr. Vives has been reluctant to do it because of the coronavirus outbreak.  We have gradually reduced his dose of beta-blocker since he was having fairly frequent ventricular pacing, he is now only taking metoprolol 12.5 mg once daily.  Pacemaker interrogation shows normal device function.  Estimated generator longevity is 4-5 years.  He has roughly 60% ventricular pacing.  There are no meaningful episodes of rapid ventricular response and heart rate histogram distribution appears to be appropriate.  Past Medical History:  Diagnosis Date  . Arthritis    back and neck  . Atrial fibrillation ([place]) 10/10/2012  . Chronic back pain   . Gout   . HTN (hypertension) 05/01/2013  . Hyperlipidemia   . Hypertension   . OSA on CPAP    wears CPAP nightly  . Pacemaker   . Pacemaker -[place] 2010 05/01/2013  . Paget disease, extramammary     left groin  . Permanent atrial fibrillation     Past Surgical History:  Procedure Laterality Date  . carpel tunnel surgery Right 2012  . CHOLECYSTECTOMY    . COLONOSCOPY    . CYST EXCISION Right 05/08/2017   Procedure: REEXCISION OF EXTRA MAMARY PAGETS DISEASE OF RIGHT GROIN;  Surgeon: [name], DO;  Location: [place];  Service: Plastics;  Laterality: Right;  . EXCISION MASS LOWER EXTREMETIES Left 09/26/2017   Procedure: Excision of left groin extra mammary Paget's disease;  Surgeon: [name], DO;  Location: [place];  Service: Plastics;  Laterality: Left;  . EXCISION MASS LOWER EXTREMETIES Left 10/22/2017   Procedure: RE-EXCISION OF LEFT GROIN EXTRA MAMMARY PAGET'S DISEASE;  Surgeon: [name], DO;  Location: [place];  Service: Plastics;  Laterality: Left;  . GALLBLADDER SURGERY  12/2006  . LESION EXCISION WITH COMPLEX REPAIR Bilateral 07/31/2017   Procedure: BIOPSY OF BILATERAL GROIN  CHANGING SKIN LESIONS;  Surgeon: [name], DO;  Location: [place];  Service: Plastics;  Laterality: Bilateral;  . LIPOMA EXCISION N/A 11/01/2016   Procedure: EXCISION OF SEBACEOUS CYST ON BACK;  Surgeon: [name], DO;  Location: [place];  Service: Plastics;  Laterality: N/A;  . MASS EXCISION N/A 11/01/2016   Procedure: EXCISION OF RIGHT GROIN SKIN LESION;  Surgeon: [name], DO;  Location: [place];  Service: Plastics;  Laterality: N/A;  . MASS EXCISION Right 11/15/2016   Procedure: EXCISION POSITIVE MARGIN RIGHT GROIN;  Surgeon: [name], DO;  Location: [place];  Service: Plastics;  Laterality: Right;  . MASS EXCISION Right 04/04/2017   Procedure: REEXCISION OF EXTRA MAMARY PAGETS SKIN DISEASE OF RIGHT GROIN;  Surgeon: [name], DO;  Location: [place];  Service: Plastics;  Laterality:  Right;  . NM MYOCAR PERF WALL MOTION  05/22/07   no significant ischemia  . PERMANENT PACEMAKER INSERTION  01/09/2009   [employer]ude  . TONSILLECTOMY     age 36  . US ECHOCARDIOGRAPHY  11/24/07   mild LVH,LA & RA mod to severely dilated,mild mitral annular ca+,mild TR,mild Pulmonary hypertensio,AOV mod. sclerotic,mild AI w/root dilatation and ca+.    Current Medications: Outpatient Medications Prior to Visit  Medication Sig Dispense Refill  . apixaban (ELIQUIS) 5 MG TABS tablet Take 1 tablet (5 mg total) by mouth 2 (two) times daily. 180 tablet 1  . atorvastatin (LIPITOR) 20 MG tablet Take 1 tablet (20 mg total) by mouth daily. 90 tablet 3  . baclofen (LIORESAL) 10 MG tablet Take 10 mg by mouth 2 (two) times daily.   0  . gabapentin (NEURONTIN) 300 MG capsule Take 300-600 mg by mouth See admin instructions. Take 600 mg by mouth in the morning and 300 mg at 4 PM daily    . metoprolol tartrate (LOPRESSOR) 25 MG tablet Take 1 tablet (25 mg total) by mouth 2 (two) times daily. 90 tablet 3  . NON FORMULARY CPAP: At bedtime    . Thiamine HCl (VITAMIN B-1) 250 MG tablet Take 250 mg by mouth daily.    Marland Kitchen ULORIC 40 MG tablet Take 1 tablet (40 mg total) by mouth daily. 90 tablet 3  . valsartan-hydrochlorothiazide (DIOVAN HCT) 160-12.5 MG tablet Take 1 tablet by mouth daily. 90 tablet 3   No facility-administered medications prior to visit.      Allergies:   Patient has no known allergies.   Social History   Socioeconomic History  . Marital status: Married    Spouse name: Not on file  . Number of children: Not on file  . Years of education: Not on file  . Highest education level: Not on file  Occupational History  . Not on file  Social Needs  . Financial resource strain: Not on file  . Food insecurity    Worry: Not on file    Inability: Not on file  . Transportation needs    Medical: Not on file    Non-medical: Not on file  Tobacco Use  . Smoking status: Former Smoker    Packs/day: 3.00     Years: 13.00    Pack years: 39.00    Quit date: 07/23/1976    Years since quitting: 42.6  . Smokeless tobacco: Never Used  Substance and Sexual Activity  . Alcohol use: Yes    Alcohol/week: 0.0 standard drinks    Comment: 2oz scotch daily  . Drug use: No  . Sexual activity: Not on file  Lifestyle  . Physical activity    Days per week: Not on file    Minutes per session: Not on file  . Stress: Not on file  Relationships  . Social Herbalist on phone: Not on file    Gets together: Not on file    Attends religious service: Not on file    Active member of club or organization: Not on file    Attends meetings of clubs or organizations: Not on file  Relationship status: Not on file  Other Topics Concern  . Not on file  Social History Narrative  . Not on file     Family History:  The patient's family history includes Breast cancer in his paternal grandmother; Lung cancer in his maternal grandfather; Prostate cancer in his paternal grandfather; Stomach cancer in his father.   ROS:   Please see the history of present illness.    All other systems are reviewed and are negative PHYSICAL EXAM:   VS:  BP 105/62   Pulse 70   Temp (!) 97.2 F (36.2 C)   Ht 6\' 3"  (1.905 m)   Wt 300 lb (136.1 kg)   SpO2 96%   BMI 37.50 kg/m      General: Alert, oriented x3, no distress, severely obese.  Healthy left subclavian pacemaker site. Head: no evidence of trauma, PERRL, EOMI, no exophtalmos or lid lag, no myxedema, no xanthelasma; normal ears, nose and oropharynx Neck: normal jugular venous pulsations and no hepatojugular reflux; brisk carotid pulses without delay and no carotid bruits Chest: clear to auscultation, no signs of consolidation by percussion or palpation, normal fremitus, symmetrical and full respiratory excursions Cardiovascular: normal position and quality of the apical impulse, regular rhythm, normal first and paradoxically split second heart sounds, no murmurs,  rubs or gallops Abdomen: no tenderness or distention, no masses by palpation, no abnormal pulsatility or arterial bruits, normal bowel sounds, no hepatosplenomegaly Extremities: no clubbing, cyanosis or edema; 2+ radial, ulnar and brachial pulses bilaterally; 2+ right femoral, posterior tibial and dorsalis pedis pulses; 2+ left femoral, posterior tibial and dorsalis pedis pulses; no subclavian or femoral bruits Neurological: grossly nonfocal Psych: Normal mood and affect    Wt Readings from Last 3 Encounters:  03/16/19 300 lb (136.1 kg)  09/05/18 290 lb (131.5 kg)  08/25/18 292 lb 6.4 oz (132.6 kg)      Studies/Labs Reviewed:   EKG:  EKG is ordered today and shows atrial fibrillation with intermittent ventricular pacing, native beats show incomplete right bundle branch block.  QTc 423 ms 05/19/2018: ALT 13 05/25/2018: BUN 10; Creatinine, Ser 1.00; Hemoglobin 12.6; Platelets 203; Potassium 4.2; Sodium 139   Lipid Panel    Component Value Date/Time   CHOL 173 10/25/2014 0838   TRIG 129 10/25/2014 0838   HDL 65 10/25/2014 0838   CHOLHDL 2.7 10/25/2014 0838   VLDL 26 10/25/2014 0838   LDLCALC 82 10/25/2014 0838    ASSESSMENT:    1. Permanent atrial fibrillation   2. Pacemaker   3. Long term (current) use of anticoagulants   4. Essential hypertension   5. OSA (obstructive sleep apnea)   6. Class 2 severe obesity due to excess calories with serious comorbidity and body mass index (BMI) of 37.0 to 37.9 in adult [facility])      PLAN:  In order of problems listed above:  1. Afib: Rate control is good without much in the way of medications.  I think he could stop his beta-blocker completely.  It seems to be helping a bit with his blood pressure. 2. PM: Normal device function.  Not amenable to remote downloads.  Check in office every 6 months (since he is not pacemaker dependent, this year will extend the review to every 12 months, until the coronavirus pandemic is under better  control.). 3. Anticoagulation: CHADSVasc 3 (age 26, HTN).  He has switched to apixaban without any bleeding complications. 4. HTN: Well-controlled. 5. OSA: Reports compliance with CPAP and benefiting from its  use. 6. Obesity: Remains severely obese.  Activity limited by severe back pain.    Medication Adjustments/Labs and Tests Ordered: Current medicines are reviewed at length with the patient today.  Concerns regarding medicines are outlined above.  Medication changes, Labs and Tests ordered today are listed in the Patient Instructions below. Patient Instructions  Medication Instructions:  Your physician recommends that you continue on your current medications as directed. Please refer to the Current Medication list given to you today.  If you need a refill on your cardiac medications before your next appointment, please call your pharmacy.   Lab work: None ordered   If you have labs (blood work) drawn today and your tests are completely normal, you will receive your results only by: [place] (if you have MyChart) OR A paper copy in the mail If you have any lab test that is abnormal or we need to change your treatment, we will call you to review the results.  Testing/Procedures: None ordered  Follow-Up: At [facility], you and your health needs are our priority.  As part of our continuing mission to provide you with exceptional heart care, we have created designated Provider Care Teams.  These Care Teams include your primary Cardiologist (physician) and Advanced Practice Providers (APPs -  Physician Assistants and Nurse Practitioners) who all work together to provide you with the care you need, when you need it. You will need a follow up appointment in 12 months.  Please call our office 2 months in advance to schedule this appointment.  You may see Sanda Klein, MD or one of the following Advanced Practice Providers on your designated Care Team: Almyra Deforest, PA-C Fabian Sharp,  [place]          Signed, Sanda Klein, MD  03/17/2019 10:58 AM    [address], [place], [place]  [address] Phone: [phone]; Fax: [phone]

## 2019-03-17 ENCOUNTER — Encounter: Payer: Self-pay | Admitting: Cardiovascular Disease

## 2019-03-23 DIAGNOSIS — Z7901 Long term (current) use of anticoagulants: Secondary | ICD-10-CM | POA: Diagnosis not present

## 2019-03-23 DIAGNOSIS — M545 Low back pain: Secondary | ICD-10-CM | POA: Diagnosis not present

## 2019-03-23 DIAGNOSIS — Z950 Presence of cardiac pacemaker: Secondary | ICD-10-CM | POA: Diagnosis not present

## 2019-03-23 DIAGNOSIS — I10 Essential (primary) hypertension: Secondary | ICD-10-CM | POA: Diagnosis not present

## 2019-03-23 DIAGNOSIS — G4733 Obstructive sleep apnea (adult) (pediatric): Secondary | ICD-10-CM | POA: Diagnosis not present

## 2019-03-23 DIAGNOSIS — I482 Chronic atrial fibrillation, unspecified: Secondary | ICD-10-CM | POA: Diagnosis not present

## 2019-03-24 ENCOUNTER — Other Ambulatory Visit: Payer: Self-pay | Admitting: Cardiovascular Disease

## 2019-05-02 IMAGING — CT CT ANGIO CHEST
1 of 6 series · 17 of 36 positions shown · IV contrast (APPLIED)
Comparison: Chest CT September 24, 2016; chest radiograph November 07, 2017

ADDENDUM:
There is a stable 5 mm nodular opacity in the lateral segment of the
left lower lobe seen on axial slice 96 series 6.
CLINICAL DATA: Hemoptysis and shortness of breath

EXAM:
CT ANGIOGRAPHY CHEST WITH CONTRAST
TECHNIQUE: Multidetector CT imaging of the chest was performed using the
standard protocol during bolus administration of intravenous
contrast. Multiplanar CT image reconstructions and MIPs were
obtained to evaluate the vascular anatomy.
CONTRAST:  100mL [id number] IOPAMIDOL ([id number]) INJECTION 76%

[Series 5: thins · axial · 0.89mm/px · z∈[-250,-16]mm · 17 of 262 slices shown]
[im 14/262  lung]
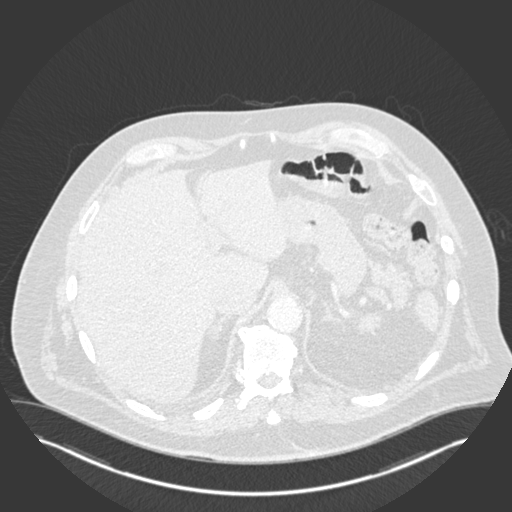
[im 27/262  mediastinal]
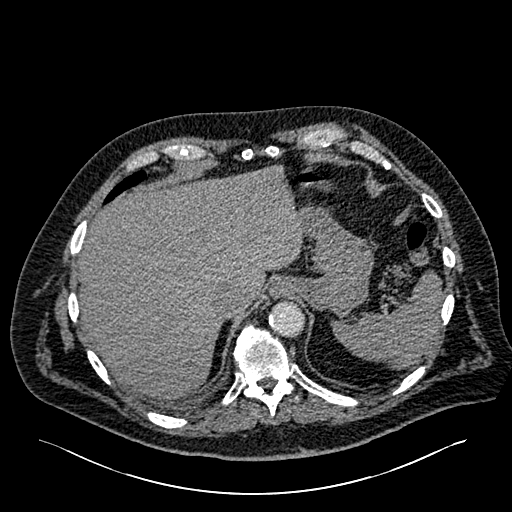
[im 40/262  lung]
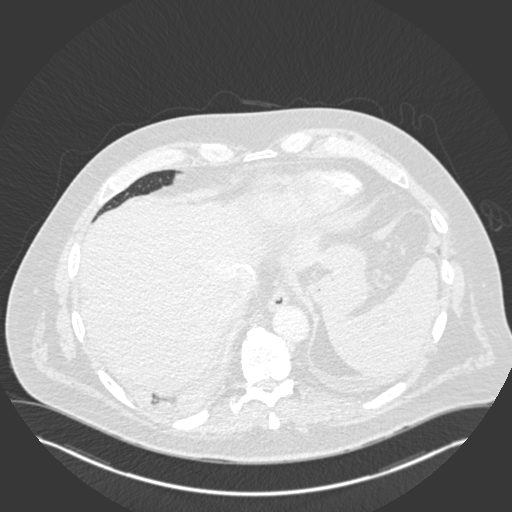
[im 53/262  mediastinal]
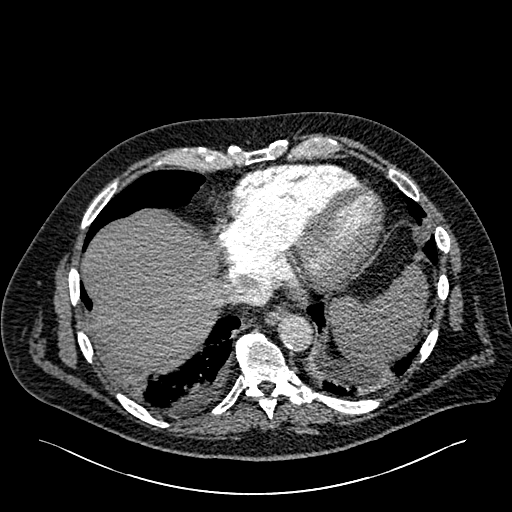
[im 79/262  lung]
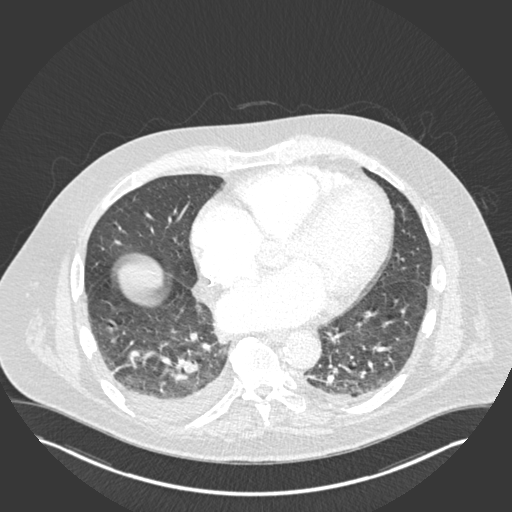
[im 92/262  mediastinal]
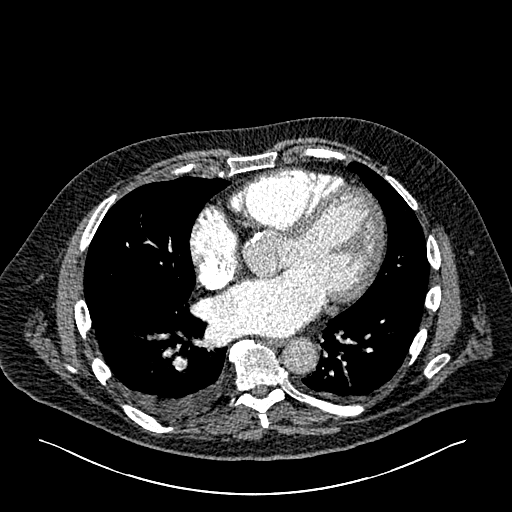
[im 105/262  lung]
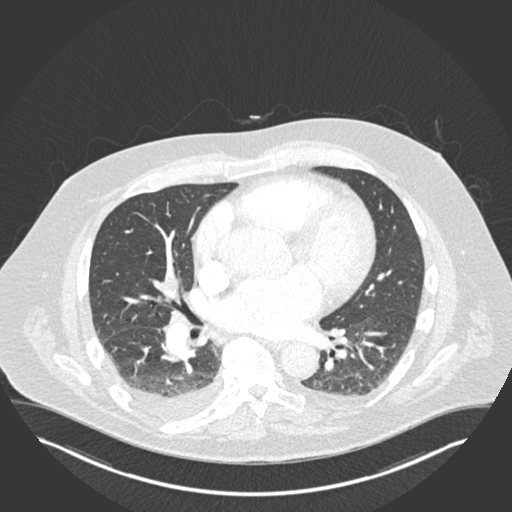
[im 118/262  mediastinal]
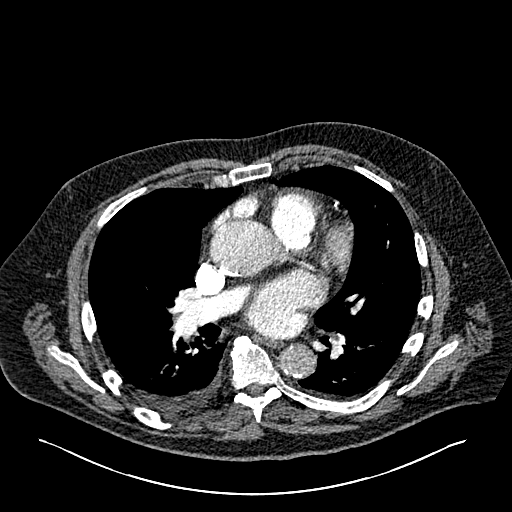
[im 131/262  lung]
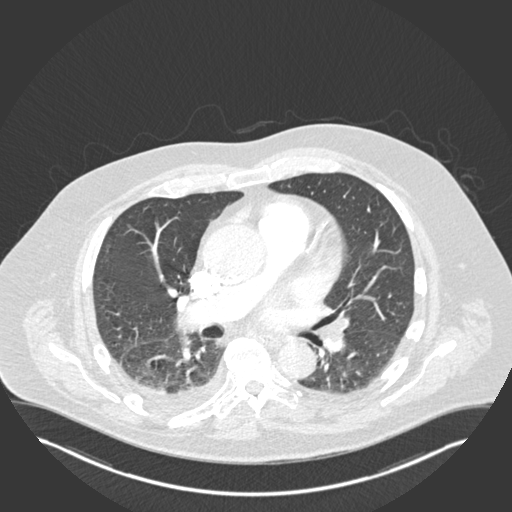
[im 144/262  mediastinal]
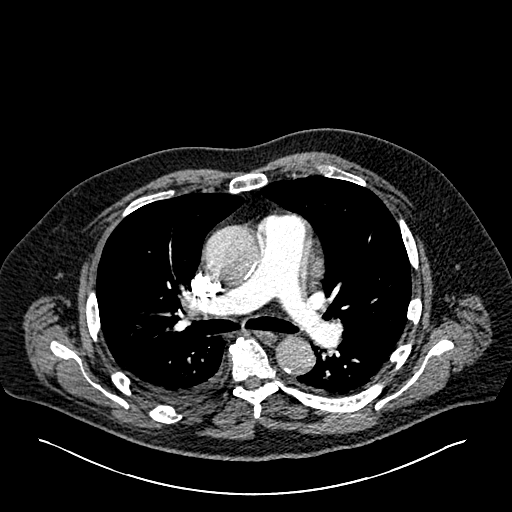
[im 157/262  lung]
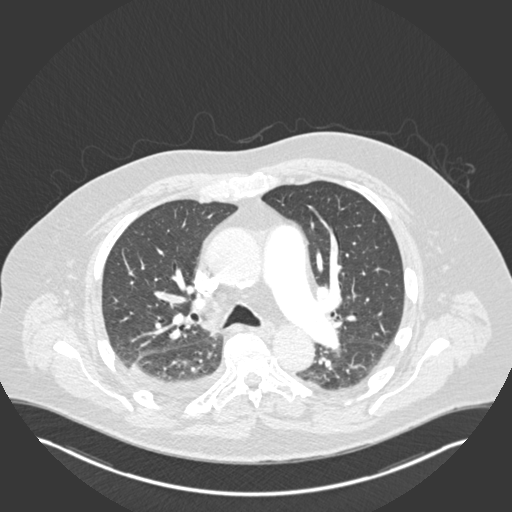
[im 170/262  mediastinal]
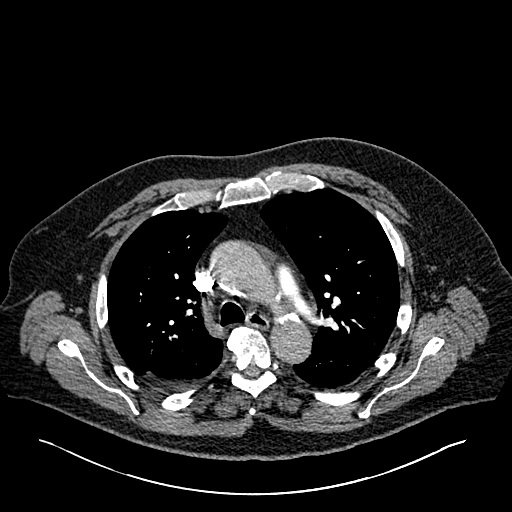
[im 183/262  lung]
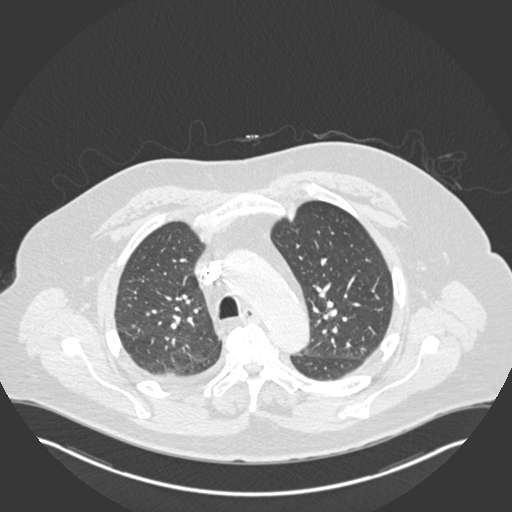
[im 209/262  mediastinal]
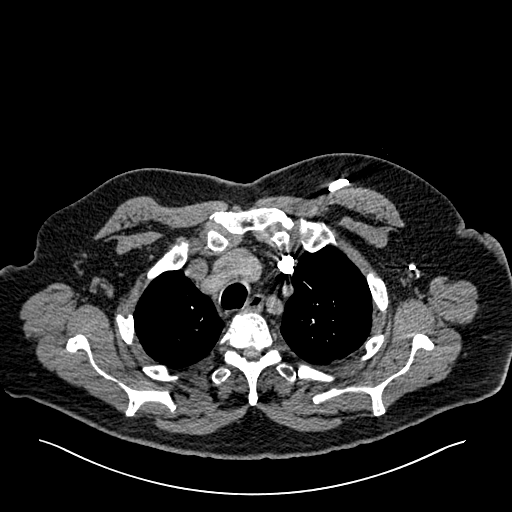
[im 222/262  lung]
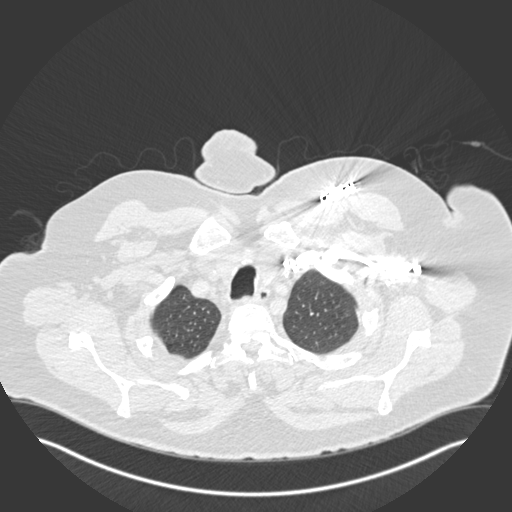
[im 235/262  mediastinal]
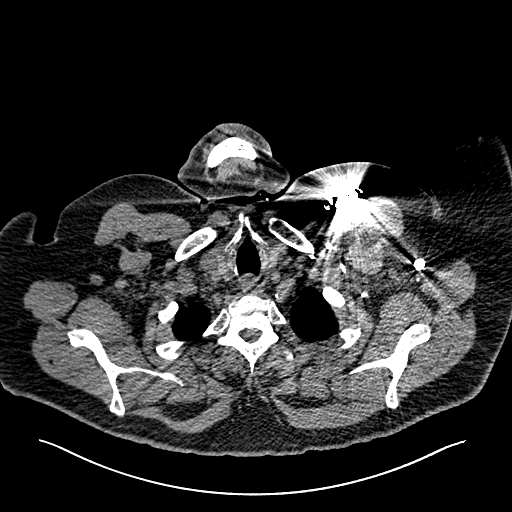
[im 248/262  lung]
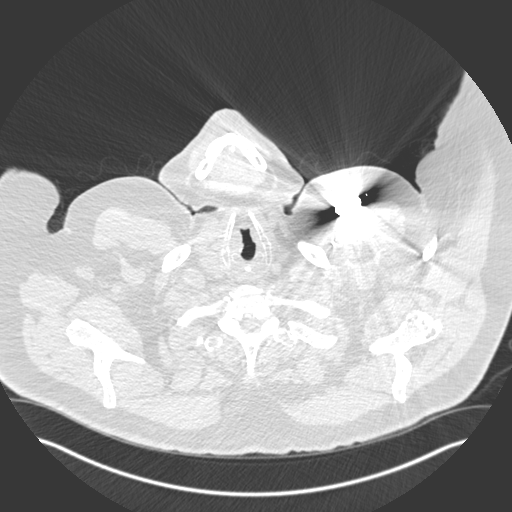

[17 of 36 positions shown; findings below may reference images not displayed]

FINDINGS: Cardiovascular: There are pulmonary emboli in multiple left and
right lower lobe pulmonary arterial branches. Pulmonary emboli are
somewhat more central in location on the left than on the right,
although there is no pulmonary embolus in either main pulmonary
artery. There is no evidence of right heart strain; right ventricle
to left ventricle diameter ratio is less than 0.9. Pacemaker lead is
attached to the right ventricle.

There is prominence of the ascending thoracic aorta measuring 4.9 x
4.9 cm in transverse diameter. No dissection is evident. There is
atherosclerotic calcification in the aorta as well as in the
proximal great vessels. There are foci of coronary artery
calcification. There is no pericardial effusion or pericardial
thickening.

The main pulmonary outflow tract is dilated measuring 3.7 cm, a
finding indicative of pulmonary arterial hypertension.

Mediastinum/Nodes: Thyroid appears normal. No appreciable thoracic
adenopathy. No esophageal lesions are evident.

Lungs/Pleura: There are small pleural effusions bilaterally,
slightly larger on the right than on the left. There is patchy
bibasilar atelectasis. There is no appreciable edema or
consolidation.

Upper Abdomen: There is atherosclerotic calcification in the upper
abdominal aorta. There is reflux of contrast into the inferior vena
cava and hepatic veins. Visualized upper abdominal structures
otherwise appear unremarkable.

Musculoskeletal: There is degenerative change in the thoracic spine
with diffuse idiopathic skeletal hyperostosis. No blastic or lytic
bone lesions.

Review of the MIP images confirms the above findings.
IMPRESSION: 1. Lower lobe pulmonary artery emboli bilaterally, slightly more on
the left than on the right. No more central pulmonary embolus. No
right heart strain evident.

2. Dilatation of the ascending thoracic aorta measuring 4.9 x
cm. Ascending thoracic aortic aneurysm. Recommend semi-annual
imaging followup by CTA or MRA and referral to cardiothoracic
surgery if not already obtained. This recommendation follows 2101
ACCF/AHA/AATS/ACR/ASA/SCA/JUMPER/BENEK/SIMBA/METZ Guidelines for the
Diagnosis and Management of Patients With Thoracic Aortic Disease.
Circulation. 2101; 121: [id number]. No evident dissection. There is
aortic atherosclerosis as well as foci of calcification in great
vessels and several coronary arteries.

3. Prominence of the main pulmonary outflow tract consistent with
pulmonary arterial hypertension.

4. Reflux of contrast into the inferior vena cava and hepatic veins,
likely indicative of increased right heart pressure.

5. Small pleural effusions bilaterally with bibasilar atelectatic
change. No frank airspace consolidation.

6.  No appreciable thoracic adenopathy.

7.  Pacemaker lead attached to right ventricle.

Critical Value/emergent results were called by telephone at the time
of interpretation on 11/07/2017 at [DATE] to Dr. HAZZEM MELK , who
verbally acknowledged these results.

Aortic aneurysm NOS (2PTL3-EA0.O).

Aortic Atherosclerosis (2PTL3-OJS.S).

## 2019-05-26 DIAGNOSIS — G8929 Other chronic pain: Secondary | ICD-10-CM | POA: Diagnosis not present

## 2019-05-26 DIAGNOSIS — G4733 Obstructive sleep apnea (adult) (pediatric): Secondary | ICD-10-CM | POA: Diagnosis not present

## 2019-05-26 DIAGNOSIS — Z950 Presence of cardiac pacemaker: Secondary | ICD-10-CM | POA: Diagnosis not present

## 2019-05-26 DIAGNOSIS — I872 Venous insufficiency (chronic) (peripheral): Secondary | ICD-10-CM | POA: Diagnosis not present

## 2019-05-26 DIAGNOSIS — I10 Essential (primary) hypertension: Secondary | ICD-10-CM | POA: Diagnosis not present

## 2019-05-26 DIAGNOSIS — G629 Polyneuropathy, unspecified: Secondary | ICD-10-CM | POA: Diagnosis not present

## 2019-05-26 DIAGNOSIS — I482 Chronic atrial fibrillation, unspecified: Secondary | ICD-10-CM | POA: Diagnosis not present

## 2019-06-23 ENCOUNTER — Other Ambulatory Visit: Payer: Self-pay | Admitting: Cardiovascular Disease

## 2019-06-25 ENCOUNTER — Telehealth: Payer: Self-pay | Admitting: Orthopedic Surgery

## 2019-06-25 NOTE — Telephone Encounter (Signed)
Patient left a voicemail message inquiring about a PA for the Uloric.  CB#[phone].  Thank you.

## 2019-06-26 NOTE — Telephone Encounter (Signed)
Prior authorization has been sent and awaiting on confirmation.

## 2019-06-30 ENCOUNTER — Other Ambulatory Visit (INDEPENDENT_AMBULATORY_CARE_PROVIDER_SITE_OTHER): Payer: Self-pay | Admitting: Physician Assistant

## 2019-06-30 ENCOUNTER — Telehealth: Payer: Self-pay | Admitting: Orthopedic Surgery

## 2019-06-30 NOTE — Telephone Encounter (Signed)
Patient called advised the Rx is not showing that it was [place]. Patient asked to call [phone] to get the approval for Uloric. The number to contact patient is [phone]

## 2019-07-01 ENCOUNTER — Telehealth: Payer: Self-pay

## 2019-07-01 NOTE — Telephone Encounter (Signed)
Called pt and informed pt that for an annual visit his appt needs to be an in office appt to get this done. Tried to explain this is how we make sure all is well with his heart. Pt stresses that he DOES NOT  Come to in office dr  Appts, he states that he is "not going to risk his life and die just to get and EKG" and wants to cancel his appointment. Appt cancelled, he will Cb when he decides to come into the office.

## 2019-07-01 NOTE — Telephone Encounter (Signed)
auth still pending was sent to cover my meds yesterday.

## 2019-07-03 ENCOUNTER — Telehealth: Payer: Self-pay | Admitting: Orthopedic Surgery

## 2019-07-03 ENCOUNTER — Other Ambulatory Visit: Payer: Self-pay | Admitting: Physician Assistant

## 2019-07-03 ENCOUNTER — Telehealth: Payer: Medicare Other | Admitting: Cardiovascular Disease

## 2019-07-03 MED ORDER — ULORIC 40 MG PO TABS: 40.0000 mg | ORAL_TABLET | Freq: Every day | ORAL | 3 refills | Status: DC

## 2019-07-03 NOTE — Telephone Encounter (Signed)
Pt called in requesting prior approval for his medication Febuxostat 40 MG, pharmacy is [facility].   [phone]

## 2019-07-03 NOTE — Telephone Encounter (Signed)
Bevely Palmer please advise. Thank you.

## 2019-07-03 NOTE — Telephone Encounter (Signed)
Called patient to let him rx was called in

## 2019-07-07 ENCOUNTER — Other Ambulatory Visit: Payer: Self-pay

## 2019-07-07 ENCOUNTER — Telehealth: Payer: Self-pay | Admitting: Orthopedic Surgery

## 2019-07-07 MED ORDER — FEBUXOSTAT 40 MG PO TABS: 40.0000 mg | ORAL_TABLET | Freq: Every day | ORAL | 3 refills | Status: DC

## 2019-07-07 NOTE — Telephone Encounter (Signed)
Patient was called and informed that Rx was sent to pharmcy

## 2019-07-07 NOTE — Telephone Encounter (Signed)
Patient called in requesting refill. Patient states he needs generic brand of Uloric which is Febuxostat. Patient states insurance will not pay for Uloric. Patient is requesting medication be filled with [facility]. Phone number to pharmacy is [phone]. Patient is also requesting a call back from Dr. Sharol Given nurse before prescription is called in. Patient phone number is [phone].

## 2019-07-08 ENCOUNTER — Telehealth: Payer: Self-pay | Admitting: Physician Assistant

## 2019-07-08 NOTE — Telephone Encounter (Signed)
Hinton Dyer with [facility] called in regards to a prescription uloric 40MG  sent in by [facility], she just needs to know if it is okay to use generic on this medication?   [phone] Reference#: [id number]

## 2019-07-08 NOTE — Telephone Encounter (Signed)
Generic is fine 

## 2019-07-08 NOTE — Telephone Encounter (Signed)
Cameron Sharp I sent in patient for generic brand, Is this okay for him? Thank you

## 2019-07-15 ENCOUNTER — Telehealth: Payer: Self-pay

## 2019-07-15 NOTE — Telephone Encounter (Signed)
Received call from insurance company requesting prior auth for uloric 40 mg states that the pt requires PA for qty limit over ride and for the medication its self. Advised to call [phone] or fax [phone]. States that the pt would have to use allopurinol that its the preferred medication. The pt has only tried colchicine by chart record and does not have a hx fo liver disease or kidney disease just AKI.

## 2019-07-15 NOTE — Telephone Encounter (Signed)
New prior auth entered in cover my meds. This is pending.

## 2019-07-21 ENCOUNTER — Telehealth: Payer: Self-pay | Admitting: Orthopedic Surgery

## 2019-07-21 ENCOUNTER — Other Ambulatory Visit: Payer: Self-pay | Admitting: Orthopedic Surgery

## 2019-07-21 ENCOUNTER — Other Ambulatory Visit: Payer: Self-pay

## 2019-07-21 MED ORDER — ALLOPURINOL 100 MG PO TABS: 100.0000 mg | ORAL_TABLET | Freq: Two times a day (BID) | ORAL | 3 refills | Status: AC

## 2019-07-21 MED ORDER — ALLOPURINOL 100 MG PO TABS: 100.0000 mg | ORAL_TABLET | Freq: Two times a day (BID) | ORAL | 3 refills | Status: DC

## 2019-07-21 NOTE — Telephone Encounter (Signed)
Prior auth for uloric has been denied. Insurance will only cover allopurinol and so the pt will need an rx for this. That is the preferred medication and he has not had a failure or intolerance documented in his chart and so they will not approve at this time. Please send in rx and let me know when this has been done so I can notify the pt.

## 2019-07-21 NOTE — Telephone Encounter (Signed)
I called and sw pt to advise insurance denied uloric. Pt voiced understanding and he was in agreement to trying allopurinol he wanted [facility] and said that he wanted the three month supply. He would call and make an appt for follow up in 4 weeks to recheck uric acid level and to also monitor kidney function.

## 2019-07-21 NOTE — Telephone Encounter (Signed)
[facility]

## 2019-07-21 NOTE — Telephone Encounter (Signed)
Patient called. His request for a refill was denied by his pharmacy and they are suggesting an alternate medication. He needs to speak with someone here to discuss the matter.   Call back number: [phone]

## 2019-07-22 ENCOUNTER — Other Ambulatory Visit: Payer: Self-pay

## 2019-07-22 MED ORDER — VALSARTAN-HYDROCHLOROTHIAZIDE 160-12.5 MG PO TABS: 1.0000 | ORAL_TABLET | Freq: Every day | ORAL | 2 refills | Status: AC

## 2019-07-22 NOTE — Telephone Encounter (Signed)
Rx(s) sent to pharmacy electronically.  

## 2019-07-28 DIAGNOSIS — R82998 Other abnormal findings in urine: Secondary | ICD-10-CM | POA: Diagnosis not present

## 2019-08-04 DIAGNOSIS — Z950 Presence of cardiac pacemaker: Secondary | ICD-10-CM | POA: Diagnosis not present

## 2019-08-04 DIAGNOSIS — E785 Hyperlipidemia, unspecified: Secondary | ICD-10-CM | POA: Diagnosis not present

## 2019-08-04 DIAGNOSIS — Z1339 Encounter for screening examination for other mental health and behavioral disorders: Secondary | ICD-10-CM | POA: Diagnosis not present

## 2019-08-04 DIAGNOSIS — I482 Chronic atrial fibrillation, unspecified: Secondary | ICD-10-CM | POA: Diagnosis not present

## 2019-08-04 DIAGNOSIS — M545 Low back pain: Secondary | ICD-10-CM | POA: Diagnosis not present

## 2019-08-04 DIAGNOSIS — G8929 Other chronic pain: Secondary | ICD-10-CM | POA: Diagnosis not present

## 2019-08-04 DIAGNOSIS — M109 Gout, unspecified: Secondary | ICD-10-CM | POA: Diagnosis not present

## 2019-08-04 DIAGNOSIS — G629 Polyneuropathy, unspecified: Secondary | ICD-10-CM | POA: Diagnosis not present

## 2019-08-04 DIAGNOSIS — G4733 Obstructive sleep apnea (adult) (pediatric): Secondary | ICD-10-CM | POA: Diagnosis not present

## 2019-08-04 DIAGNOSIS — I10 Essential (primary) hypertension: Secondary | ICD-10-CM | POA: Diagnosis not present

## 2019-08-04 DIAGNOSIS — I872 Venous insufficiency (chronic) (peripheral): Secondary | ICD-10-CM | POA: Diagnosis not present

## 2019-08-04 DIAGNOSIS — Z0000 Encounter for general adult medical examination without abnormal findings: Secondary | ICD-10-CM | POA: Diagnosis not present

## 2019-08-04 DIAGNOSIS — Z1331 Encounter for screening for depression: Secondary | ICD-10-CM | POA: Diagnosis not present

## 2019-08-05 ENCOUNTER — Other Ambulatory Visit: Payer: Self-pay

## 2019-08-05 MED ORDER — APIXABAN 5 MG PO TABS: 5.0000 mg | ORAL_TABLET | Freq: Two times a day (BID) | ORAL | 0 refills | Status: DC

## 2019-08-07 DIAGNOSIS — Z23 Encounter for immunization: Secondary | ICD-10-CM | POA: Diagnosis not present

## 2019-08-09 ENCOUNTER — Other Ambulatory Visit: Payer: Self-pay | Admitting: Cardiovascular Disease

## 2019-08-10 ENCOUNTER — Other Ambulatory Visit: Payer: Self-pay

## 2019-08-10 MED ORDER — APIXABAN 5 MG PO TABS: 5.0000 mg | ORAL_TABLET | Freq: Two times a day (BID) | ORAL | 1 refills | Status: AC

## 2019-08-27 ENCOUNTER — Other Ambulatory Visit: Payer: Self-pay

## 2019-08-27 ENCOUNTER — Ambulatory Visit (INDEPENDENT_AMBULATORY_CARE_PROVIDER_SITE_OTHER): Payer: Medicare Other | Admitting: Orthopedic Surgery

## 2019-08-27 ENCOUNTER — Encounter: Payer: Self-pay | Admitting: Orthopedic Surgery

## 2019-08-27 VITALS — Ht 75.0 in | Wt 300.0 lb

## 2019-08-27 DIAGNOSIS — M1A0710 Idiopathic chronic gout, right ankle and foot, without tophus (tophi): Secondary | ICD-10-CM

## 2019-08-27 NOTE — Progress Notes (Signed)
Office Visit Note   Patient: Cameron Sharp           Date of Birth: [birth year]           MRN: [id number] Visit Date: 08/27/2019              Requested by: [name], [address] [place],  [place] [address] PCP: [name], MD  Chief Complaint  Patient presents with  . Left Foot - Follow-up  . Right Foot - Follow-up      HPI: The patient presents today for follow-up on his uric acid level.  He has been on allopurinol.  He was recently switched from [place] for insurance reasons.  This is a follow-up uric acid.  You want to ensure that he is still doing well as well as checking his liver functions he is otherwise doing well he is chronic venous stasis disease but is doing well with socks and has no complaints  Assessment & Plan: Visit Diagnoses:  1. Idiopathic chronic gout of right foot without tophus     Plan: He will continue with his allopurinol we will contact him with the results of his test follow-up in 6 months  Follow-Up Instructions: No follow-ups on file.   Ortho Exam  Patient is alert, oriented, no adenopathy, well-dressed, normal affect, normal respiratory effort. Compression stockings are in place swelling is well controlled no open areas or ulcers  Imaging: No results found. No images are attached to the encounter.  Labs: Lab Results  Component Value Date   LABURIC 3.9 (L) 07/02/2018   REPTSTATUS 05/28/2018 FINAL 05/23/2018   CULT  05/23/2018    NO GROWTH 5 DAYS Performed at [facility], [place] [address], [place], [place] [address]    LABORGA ESCHERICHIA COLI 05/19/2018     Lab Results  Component Value Date   ALBUMIN 3.5 05/19/2018   ALBUMIN 3.2 (L) 11/08/2017   ALBUMIN 4.2 10/30/2016   LABURIC 3.9 (L) 07/02/2018    No results found for: MG No results found for: VD25OH  No results found for: PREALBUMIN CBC EXTENDED Latest Ref Rng & Units 05/25/2018 05/23/2018 05/22/2018  WBC 4.0 - 10.5 K/uL 7.3 7.3 8.4  RBC 4.22 -  5.81 MIL/uL 4.05(L) 3.93(L) 3.98(L)  HGB 13.0 - 17.0 g/dL 12.6(L) 12.3(L) 12.6(L)  HCT 39.0 - 52.0 % 39.3 37.9(L) 38.1(L)  PLT 150 - 400 K/uL 203 135(L) 116(L)  NEUTROABS 1.7 - 7.7 K/uL - - -  LYMPHSABS 0.7 - 4.0 K/uL - - -     Body mass index is 37.5 kg/m.  Orders:  Orders Placed This Encounter  Procedures  . Uric acid  . Hepatic function panel   No orders of the defined types were placed in this encounter.    Procedures: No procedures performed  Clinical Data: No additional findings.  ROS:  All other systems negative, except as noted in the HPI. Review of Systems  Objective: Vital Signs: Ht 6\' 3"  (1.905 m)   Wt 300 lb (136.1 kg)   BMI 37.50 kg/m   Specialty Comments:  No specialty comments available.  PMFS History: Patient Active Problem List   Diagnosis Date Noted  . Sepsis ([place]) 05/20/2018  . Severe sepsis ([place]) 05/20/2018  . Acute cystitis without hematuria   . AKI (acute kidney injury) ([place])   . Hemoptysis 11/07/2017  . Paget disease of bone   . Atrial fibrillation, chronic ([place])   . Paget disease, extra mammary 05/30/2017  .  Varicose veins of leg with pain 06/07/2015  . Venous insufficiency of right leg 05/07/2015  . Morbid obesity ([place]) 11/13/2014  . Degenerative disc disease 11/29/2013  . Cellulitis of left leg 07/24/2013  . Pacemaker -[place] 2010 05/01/2013  . Hyperlipidemia 05/01/2013  . OSA on CPAP 05/01/2013  . Essential hypertension 05/01/2013  . Severe obesity (BMI 35.0-35.9 with comorbidity) ([place]) 05/01/2013  . Diarrhea 05/01/2013  . Permanent atrial fibrillation ([place]) 10/10/2012   Past Medical History:  Diagnosis Date  . Arthritis    back and neck  . Atrial fibrillation ([place]) 10/10/2012  . Chronic back pain   . Gout   . HTN (hypertension) 05/01/2013  . Hyperlipidemia   . Hypertension   . OSA on CPAP    wears CPAP nightly  . Pacemaker   . Pacemaker -[place] 2010 05/01/2013  .  Paget disease, extramammary    left groin  . Permanent atrial fibrillation ([facility])     Family History  Problem Relation Age of Onset  . Stomach cancer Father   . Lung cancer Maternal Grandfather   . Breast cancer Paternal Grandmother   . Prostate cancer Paternal Grandfather     Past Surgical History:  Procedure Laterality Date  . carpel tunnel surgery Right 2012  . CHOLECYSTECTOMY    . COLONOSCOPY    . CYST EXCISION Right 05/08/2017   Procedure: REEXCISION OF EXTRA MAMARY PAGETS DISEASE OF RIGHT GROIN;  Surgeon: [name], DO;  Location: [facility];  Service: Plastics;  Laterality: Right;  . EXCISION MASS LOWER EXTREMETIES Left 09/26/2017   Procedure: Excision of left groin extra mammary Paget's disease;  Surgeon: [name], DO;  Location: [facility];  Service: Plastics;  Laterality: Left;  . EXCISION MASS LOWER EXTREMETIES Left 10/22/2017   Procedure: RE-EXCISION OF LEFT GROIN EXTRA MAMMARY PAGET'S DISEASE;  Surgeon: [name], DO;  Location: [facility];  Service: Plastics;  Laterality: Left;  . GALLBLADDER SURGERY  12/2006  . LESION EXCISION WITH COMPLEX REPAIR Bilateral 07/31/2017   Procedure: BIOPSY OF BILATERAL GROIN  CHANGING SKIN LESIONS;  Surgeon: [name], DO;  Location: [facility];  Service: Plastics;  Laterality: Bilateral;  . LIPOMA EXCISION N/A 11/01/2016   Procedure: EXCISION OF SEBACEOUS CYST ON BACK;  Surgeon: [name], DO;  Location: [facility];  Service: Plastics;  Laterality: N/A;  . MASS EXCISION N/A 11/01/2016   Procedure: EXCISION OF RIGHT GROIN SKIN LESION;  Surgeon: [name], DO;  Location: [facility];  Service: Plastics;  Laterality: N/A;  . MASS EXCISION Right 11/15/2016   Procedure: EXCISION POSITIVE MARGIN RIGHT GROIN;  Surgeon: [name], DO;  Location: [facility];  Service: Plastics;   Laterality: Right;  . MASS EXCISION Right 04/04/2017   Procedure: REEXCISION OF EXTRA MAMARY PAGETS SKIN DISEASE OF RIGHT GROIN;  Surgeon: [name], DO;  Location: [facility];  Service: Plastics;  Laterality: Right;  . NM MYOCAR PERF WALL MOTION  05/22/07   no significant ischemia  . PERMANENT PACEMAKER INSERTION  01/09/2009   St[employer]Jude  . TONSILLECTOMY     age 51  . US ECHOCARDIOGRAPHY  11/24/07   mild LVH,LA & RA mod to severely dilated,mild mitral annular ca+,mild TR,mild Pulmonary hypertensio,AOV mod. sclerotic,mild AI w/root dilatation and ca+.   Social History   Occupational History  . Not on file  Tobacco Use  .  Smoking status: Former Smoker    Packs/day: 3.00    Years: 13.00    Pack years: 39.00    Quit date: 07/23/1976    Years since quitting: 43.1  . Smokeless tobacco: Never Used  Substance and Sexual Activity  . Alcohol use: Yes    Alcohol/week: 0.0 standard drinks    Comment: 2oz scotch daily  . Drug use: No  . Sexual activity: Not on file

## 2019-08-28 LAB — HEPATIC FUNCTION PANEL
AG Ratio: 2 (calc) (ref 1.0–2.5)
ALT: 8 U/L — ABNORMAL LOW (ref 9–46)
AST: 17 U/L (ref 10–35)
Albumin: 4.5 g/dL (ref 3.6–5.1)
Alkaline phosphatase (APISO): 54 U/L (ref 35–144)
Bilirubin, Direct: 0.2 mg/dL (ref 0.0–0.2)
Globulin: 2.3 g/dL (calc) (ref 1.9–3.7)
Indirect Bilirubin: 0.5 mg/dL (calc) (ref 0.2–1.2)
Total Bilirubin: 0.7 mg/dL (ref 0.2–1.2)
Total Protein: 6.8 g/dL (ref 6.1–8.1)

## 2019-08-28 LAB — EXTRA LAV TOP TUBE

## 2019-08-28 LAB — URIC ACID: Uric Acid, Serum: 5.7 mg/dL (ref 4.0–8.0)

## 2019-08-29 DIAGNOSIS — Z23 Encounter for immunization: Secondary | ICD-10-CM | POA: Diagnosis not present

## 2019-09-10 ENCOUNTER — Telehealth: Payer: Self-pay | Admitting: Cardiovascular Disease

## 2019-09-10 DIAGNOSIS — I4821 Permanent atrial fibrillation: Secondary | ICD-10-CM

## 2019-09-10 NOTE — Telephone Encounter (Signed)
New Message   Patient is calling because he states that he is due an echocardiogram and he declined it because of covid. Patient is now ready to have the echo. Please advise.

## 2019-09-10 NOTE — Telephone Encounter (Signed)
Spoke with patient. Patient was ordered an echo for 6 months in October of 2019 for atril fibrillation. Due to Covid patient did not complete the study. New order for echocardiogram placed for patient. Patient weighs 270lbs. Information will be sent to scheduling. Will route to MD and primary RN so they are aware that patient is going to complete his study.

## 2019-09-18 NOTE — Telephone Encounter (Signed)
Echo scheduled for 10/09/19 AT 1:05

## 2019-10-09 ENCOUNTER — Ambulatory Visit (HOSPITAL_COMMUNITY): Payer: Medicare Other | Attending: Internal Medicine

## 2019-10-09 ENCOUNTER — Other Ambulatory Visit: Payer: Self-pay

## 2019-10-09 DIAGNOSIS — I4821 Permanent atrial fibrillation: Secondary | ICD-10-CM | POA: Diagnosis not present

## 2019-10-14 ENCOUNTER — Telehealth: Payer: Medicare Other | Admitting: Cardiovascular Disease

## 2019-10-15 ENCOUNTER — Telehealth: Payer: Self-pay

## 2019-10-15 ENCOUNTER — Encounter: Payer: Self-pay | Admitting: Physician Assistant

## 2019-10-15 ENCOUNTER — Telehealth (INDEPENDENT_AMBULATORY_CARE_PROVIDER_SITE_OTHER): Payer: Medicare Other | Admitting: Physician Assistant

## 2019-10-15 VITALS — BP 114/62 | HR 87 | Temp 97.4°F | Ht 74.0 in | Wt 275.0 lb

## 2019-10-15 DIAGNOSIS — G4733 Obstructive sleep apnea (adult) (pediatric): Secondary | ICD-10-CM

## 2019-10-15 DIAGNOSIS — Z9989 Dependence on other enabling machines and devices: Secondary | ICD-10-CM

## 2019-10-15 DIAGNOSIS — Z950 Presence of cardiac pacemaker: Secondary | ICD-10-CM

## 2019-10-15 DIAGNOSIS — I4821 Permanent atrial fibrillation: Secondary | ICD-10-CM | POA: Diagnosis not present

## 2019-10-15 DIAGNOSIS — E785 Hyperlipidemia, unspecified: Secondary | ICD-10-CM

## 2019-10-15 DIAGNOSIS — I10 Essential (primary) hypertension: Secondary | ICD-10-CM

## 2019-10-15 DIAGNOSIS — I350 Nonrheumatic aortic (valve) stenosis: Secondary | ICD-10-CM

## 2019-10-15 NOTE — Progress Notes (Signed)
Virtual Visit via Telephone Note   This visit type was conducted due to national recommendations for restrictions regarding the COVID-19 Pandemic (e.g. social distancing) in an effort to limit this patient's exposure and mitigate transmission in our community.  Due to his co-morbid illnesses, this patient is at least at moderate risk for complications without adequate follow up.  This format is felt to be most appropriate for this patient at this time.  The patient did not have access to video technology/had technical difficulties with video requiring transitioning to audio format only (telephone).  All issues noted in this document were discussed and addressed.  No physical exam could be performed with this format.  Please refer to the patient's chart for his  consent to telehealth for [facility].   The patient was identified using 2 identifiers.  Date:  10/15/2019   ID:  [name], DOB Aug 04, 1935, MRN [id number]  Patient Location: Home Provider Location: Home  PCP:  Sharen Hint, MD  Cardiologist:  Shelva Majestic, MD Electrophysiologist:  Sanda Klein, MD   Evaluation Performed:  Follow-Up Visit  Chief Complaint:  followup  History of Present Illness:    Cameron Sharp is a 84 y.o. male with past medical history of HTN, HLD, OSA on CPAP, permanent atrial fibrillation with slow ventricular response s/p St Jude pacemaker.  Previous echocardiogram obtained on 10/11/2014 showed EF 55 to 60%, mild AI, moderate LAE.  Last sleep study obtained in June 2019 showed moderate obstructive sleep apnea with nocturnal hypopnea causing oxygen desaturation.  Recent echocardiogram obtained on 10/09/2019 showed EF 50%, severe biatrial enlargement, mild MR, moderate AS, mild to moderate AI.  Patient presents today for virtual visit.  He has been doing well and has been able to exercise at home despite back issue.  He denies any shortness of breath or chest discomfort.  He has been  compliant with CPAP therapy on a nightly basis.  There has been no significant issue recently.  He did move to [facility].  He obtained his second Pfizer vaccine shot about 6 weeks ago.  Overall, he is doing quite well from cardiology perspective.  Patient does wish to establish with a more local cardiologist near [facility] and wished to hear recommendations from Dr. Sallyanne Kuster.  I have sent Dr. Sallyanne Kuster staff message for recommendation.  The patient does not have symptoms concerning for COVID-19 infection (fever, chills, cough, or new shortness of breath).    Past Medical History:  Diagnosis Date  . Arthritis    back and neck  . Atrial fibrillation ([place]) 10/10/2012  . Chronic back pain   . Gout   . HTN (hypertension) 05/01/2013  . Hyperlipidemia   . Hypertension   . OSA on CPAP    wears CPAP nightly  . Pacemaker   . Pacemaker -[place] 2010 05/01/2013  . Paget disease, extramammary    left groin  . Permanent atrial fibrillation [facility])    Past Surgical History:  Procedure Laterality Date  . carpel tunnel surgery Right 2012  . CHOLECYSTECTOMY    . COLONOSCOPY    . CYST EXCISION Right 05/08/2017   Procedure: REEXCISION OF EXTRA MAMARY PAGETS DISEASE OF RIGHT GROIN;  Surgeon: [name], DO;  Location: [facility];  Service: Plastics;  Laterality: Right;  . EXCISION MASS LOWER EXTREMETIES Left 09/26/2017   Procedure: Excision of left groin extra mammary Paget's disease;  Surgeon: [name], DO;  Location: [facility];  Service: Clinical cytogeneticist;  Laterality: Left;  . EXCISION MASS LOWER EXTREMETIES Left 10/22/2017   Procedure: RE-EXCISION OF LEFT GROIN EXTRA MAMMARY PAGET'S DISEASE;  Surgeon: [name], DO;  Location: [facility];  Service: Plastics;  Laterality: Left;  . GALLBLADDER SURGERY  12/2006  . LESION EXCISION WITH COMPLEX REPAIR Bilateral 07/31/2017   Procedure: BIOPSY OF  BILATERAL GROIN  CHANGING SKIN LESIONS;  Surgeon: [name], DO;  Location: [facility];  Service: Plastics;  Laterality: Bilateral;  . LIPOMA EXCISION N/A 11/01/2016   Procedure: EXCISION OF SEBACEOUS CYST ON BACK;  Surgeon: [name], DO;  Location: [facility];  Service: Plastics;  Laterality: N/A;  . MASS EXCISION N/A 11/01/2016   Procedure: EXCISION OF RIGHT GROIN SKIN LESION;  Surgeon: [name], DO;  Location: [facility];  Service: Plastics;  Laterality: N/A;  . MASS EXCISION Right 11/15/2016   Procedure: EXCISION POSITIVE MARGIN RIGHT GROIN;  Surgeon: [name], DO;  Location: [facility];  Service: Plastics;  Laterality: Right;  . MASS EXCISION Right 04/04/2017   Procedure: REEXCISION OF EXTRA MAMARY PAGETS SKIN DISEASE OF RIGHT GROIN;  Surgeon: [name], DO;  Location: [facility];  Service: Plastics;  Laterality: Right;  . NM MYOCAR PERF WALL MOTION  05/22/07   no significant ischemia  . PERMANENT PACEMAKER INSERTION  01/09/2009   [employer]Jude  . TONSILLECTOMY     age 39  . US ECHOCARDIOGRAPHY  11/24/07   mild LVH,LA & RA mod to severely dilated,mild mitral annular ca+,mild TR,mild Pulmonary hypertensio,AOV mod. sclerotic,mild AI w/root dilatation and ca+.     Current Meds  Medication Sig  . allopurinol (ZYLOPRIM) 100 MG tablet Take 1 tablet (100 mg total) by mouth 2 (two) times daily.  Marland Kitchen apixaban (ELIQUIS) 5 MG TABS tablet Take 1 tablet (5 mg total) by mouth 2 (two) times daily.  . baclofen (LIORESAL) 10 MG tablet Take 10 mg by mouth 2 (two) times daily.   Marland Kitchen ezetimibe (ZETIA) 10 MG tablet Take 10 mg by mouth daily.  Marland Kitchen gabapentin (NEURONTIN) 300 MG capsule Take 300-600 mg by mouth See admin instructions. Take 600 mg by mouth in the morning and 300 mg at 4 PM daily  . metoprolol tartrate (LOPRESSOR) 25 MG tablet Take 1 tablet (25 mg total) by mouth 2 (two) times daily.  (Patient taking differently: Take 12.5 mg by mouth daily. )  . NON FORMULARY CPAP: At bedtime  . rosuvastatin (CRESTOR) 5 MG tablet Take 5 mg by mouth. Take 1 tablet on MWF.  . valsartan-hydrochlorothiazide (DIOVAN HCT) 160-12.5 MG tablet Take 1 tablet by mouth daily.     Allergies:   Patient has no known allergies.   Social History   Tobacco Use  . Smoking status: Former Smoker    Packs/day: 3.00    Years: 13.00    Pack years: 39.00    Quit date: 07/23/1976    Years since quitting: 43.2  . Smokeless tobacco: Never Used  Substance Use Topics  . Alcohol use: Yes    Alcohol/week: 0.0 standard drinks    Comment: 2oz scotch daily  . Drug use: No     Family Hx: The patient's family history includes Breast cancer in his paternal grandmother; Lung cancer in his maternal grandfather; Prostate cancer in his paternal grandfather; Stomach cancer in his father.  ROS:   Please see the history of present illness.  All other systems reviewed and are negative.   Prior CV studies:   The following studies were reviewed today:  Echo 10/09/2019 1. Left ventricular ejection fraction, by estimation, is 50%%. The left ventricle has mildly decreased function. The left ventricle has no regional wall motion abnormalities. The left ventricular internal cavity size was severely dilated. Left ventricular diastolic parameters are indeterminate.  2. Right ventricular systolic function is low normal. The right  ventricular size is normal. There is mildly elevated pulmonary artery systolic pressure.  3. Left atrial size was severely dilated.  4. Right atrial size was severely dilated.  5. The mitral valve is abnormal. Mild mitral valve regurgitation.  6. AV is thickened, calcified. 2 D images sugg moderately stenotic. Peak and mean gradients through the valve are 26 and 14 mm HG respectivel LVOT/AV VTI ratio is 0.36 consistent withmoderate AS. AVA (VTI) is 1.5 cm2 . Aortic valve regurgitation is  mildto moderate.   Labs/Other Tests and Data Reviewed:    EKG:  An ECG dated 03/14/2019 was personally reviewed today and demonstrated:  Atrial fibrillation with paced rhythm  Recent Labs: 08/27/2019: ALT 8   Recent Lipid Panel Lab Results  Component Value Date/Time   CHOL 173 10/25/2014 08:38 AM   TRIG 129 10/25/2014 08:38 AM   HDL 65 10/25/2014 08:38 AM   CHOLHDL 2.7 10/25/2014 08:38 AM   LDLCALC 82 10/25/2014 08:38 AM    Wt Readings from Last 3 Encounters:  10/15/19 275 lb (124.7 kg)  08/27/19 300 lb (136.1 kg)  03/16/19 300 lb (136.1 kg)     Objective:    Vital Signs:  BP 114/62   Pulse 87   Temp (!) 97.4 F (36.3 C)   Ht 6\' 2"  (1.88 m)   Wt 275 lb (124.7 kg)   BMI 35.31 kg/m    VITAL SIGNS:  reviewed  ASSESSMENT & PLAN:    1. Permanent atrial fibrillation: On Eliquis.  History of slow ventricular response.  He is on metoprolol more so for blood pressure reasons rather than rate control.  He has no cardiac awareness of atrial fibrillation.  Recent echocardiogram showed a severely enlarged left and right atrium consistent with permanent atrial fibrillation.  According to the patient, he was initially diagnosed with atrial fibrillation over 20 years ago.  2. S/p St Jude pacemaker: Due for device interrogation either in July or August of this year.  Patient wished to establish with a more local cardiologist after he moved to [facility], I have sent a message to Dr. Sallyanne Kuster for recommendation.  3. Obstructive sleep apnea on CPAP: Compliant with CPAP therapy.  4. Hypertension: Blood pressure well controlled on current therapy.  He is on metoprolol more so for blood pressure reasons rather than rate control.  On valsartan-hydrochlorothiazide  5. Hyperlipidemia: Continue Crestor  6. Moderate aortic stenosis: Seen on recent echocardiogram.  This is also associated with mild to moderate AI and mild MR.  Consider repeat echocardiogram in 1 year.   COVID-19  Education: The signs and symptoms of COVID-19 were discussed with the patient and how to seek care for testing (follow up with PCP or arrange E-visit).  The importance of social distancing was discussed today.  Time:   Today, I have spent 14 minutes with the patient with telehealth technology discussing the above problems.     Medication Adjustments/Labs and Tests Ordered: Current medicines are reviewed at length with the patient today.  Concerns regarding medicines are outlined above.   Tests  Ordered: No orders of the defined types were placed in this encounter.   Medication Changes: No orders of the defined types were placed in this encounter.   Follow Up: I have sent a staff message to Dr. Sallyanne Kuster for recommendation as patient wish to establish a with a cardiologist near [facility] due to recent move.  Hilbert Corrigan, [place]  10/15/2019 10:49 AM    [place]

## 2019-10-15 NOTE — Telephone Encounter (Signed)
  Patient Consent for Virtual Visit         Cameron Sharp has provided verbal consent on 10/15/2019 for a virtual visit (video or telephone).   CONSENT FOR VIRTUAL VISIT FOR:  Cameron Sharp  By participating in this virtual visit I agree to the following:  I hereby voluntarily request, consent and authorize [facility] and its employed or contracted physicians, physician assistants, nurse practitioners or other licensed health care professionals (the Practitioner), to provide me with telemedicine health care services (the "Services") as deemed necessary by the treating Practitioner. I acknowledge and consent to receive the Services by the Practitioner via telemedicine. I understand that the telemedicine visit will involve communicating with the Practitioner through live audiovisual communication technology and the disclosure of certain medical information by electronic transmission. I acknowledge that I have been given the opportunity to request an in-person assessment or other available alternative prior to the telemedicine visit and am voluntarily participating in the telemedicine visit.  I understand that I have the right to withhold or withdraw my consent to the use of telemedicine in the course of my care at any time, without affecting my right to future care or treatment, and that the Practitioner or I may terminate the telemedicine visit at any time. I understand that I have the right to inspect all information obtained and/or recorded in the course of the telemedicine visit and may receive copies of available information for a reasonable fee.  I understand that some of the potential risks of receiving the Services via telemedicine include:  Marland Kitchen Delay or interruption in medical evaluation due to technological equipment failure or disruption; . Information transmitted may not be sufficient (e.g. poor resolution of images) to allow for appropriate medical decision making by the Practitioner;  and/or  . In rare instances, security protocols could fail, causing a breach of personal health information.  Furthermore, I acknowledge that it is my responsibility to provide information about my medical history, conditions and care that is complete and accurate to the best of my ability. I acknowledge that Practitioner's advice, recommendations, and/or decision may be based on factors not within their control, such as incomplete or inaccurate data provided by me or distortions of diagnostic images or specimens that may result from electronic transmissions. I understand that the practice of medicine is not an exact science and that Practitioner makes no warranties or guarantees regarding treatment outcomes. I acknowledge that a copy of this consent can be made available to me via my patient portal ([facility]), or I can request a printed copy by calling the office of [facility].    I understand that my insurance will be billed for this visit.   I have read or had this consent read to me. . I understand the contents of this consent, which adequately explains the benefits and risks of the Services being provided via telemedicine.  . I have been provided ample opportunity to ask questions regarding this consent and the Services and have had my questions answered to my satisfaction. . I give my informed consent for the services to be provided through the use of telemedicine in my medical care

## 2019-10-15 NOTE — Patient Instructions (Addendum)
Medication Instructions:  Your physician recommends that you continue on your current medications as directed. Please refer to the Current Medication list given to you today.  *If you need a refill on your cardiac medications before your next appointment, please call your pharmacy*  Lab Work: NONE ordered at this time of appointment   If you have labs (blood work) drawn today and your tests are completely normal, you will receive your results only by: Marland Kitchen MyChart Message (if you have MyChart) OR . A paper copy in the mail If you have any lab test that is abnormal or we need to change your treatment, we will call you to review the results.  Testing/Procedures: NONE ordered at this time of appointment   Follow-Up: At [facility], you and your health needs are our priority.  As part of our continuing mission to provide you with exceptional heart care, we have created designated Provider Care Teams.  These Care Teams include your primary Cardiologist (physician) and Advanced Practice Providers (APPs -  Physician Assistants and Nurse Practitioners) who all work together to provide you with the care you need, when you need it.  We recommend signing up for the patient portal called "MyChart".  Sign up information is provided on this After Visit Summary.  MyChart is used to connect with patients for Virtual Visits (Telemedicine).  Patients are able to view lab/test results, encounter notes, upcoming appointments, etc.  Non-urgent messages can be sent to your provider as well.   To learn more about what you can do with MyChart, go to [email or web address].    Your next appointment:   Follow up not needed here at our practice due to patient moved and will establish care with Cardiologist in [place], [place]  The format for your next appointment:   Follow up not needed here at our practice due to patient moved and will establish care with Cardiologist in [place], [place]  Provider:   Follow up not needed here  at our practice due to patient moved and will establish care with Cardiologist in [place], [place]  Other Instructions

## 2019-10-16 ENCOUNTER — Telehealth: Payer: Self-pay

## 2019-10-16 NOTE — Telephone Encounter (Signed)
Called patient to discuss AVS instructions gave Hao Meng's recommendations and patient voiced understanding. AVS summary mailed to patient.    

## 2019-11-11 IMAGING — DX DG CHEST 2V
2 series · 2 of 2 positions shown · non-contrast
Comparison: 10/28/2017

CLINICAL DATA: Weakness and fever.

EXAM:
CHEST - 2 VIEW

[chest lat]
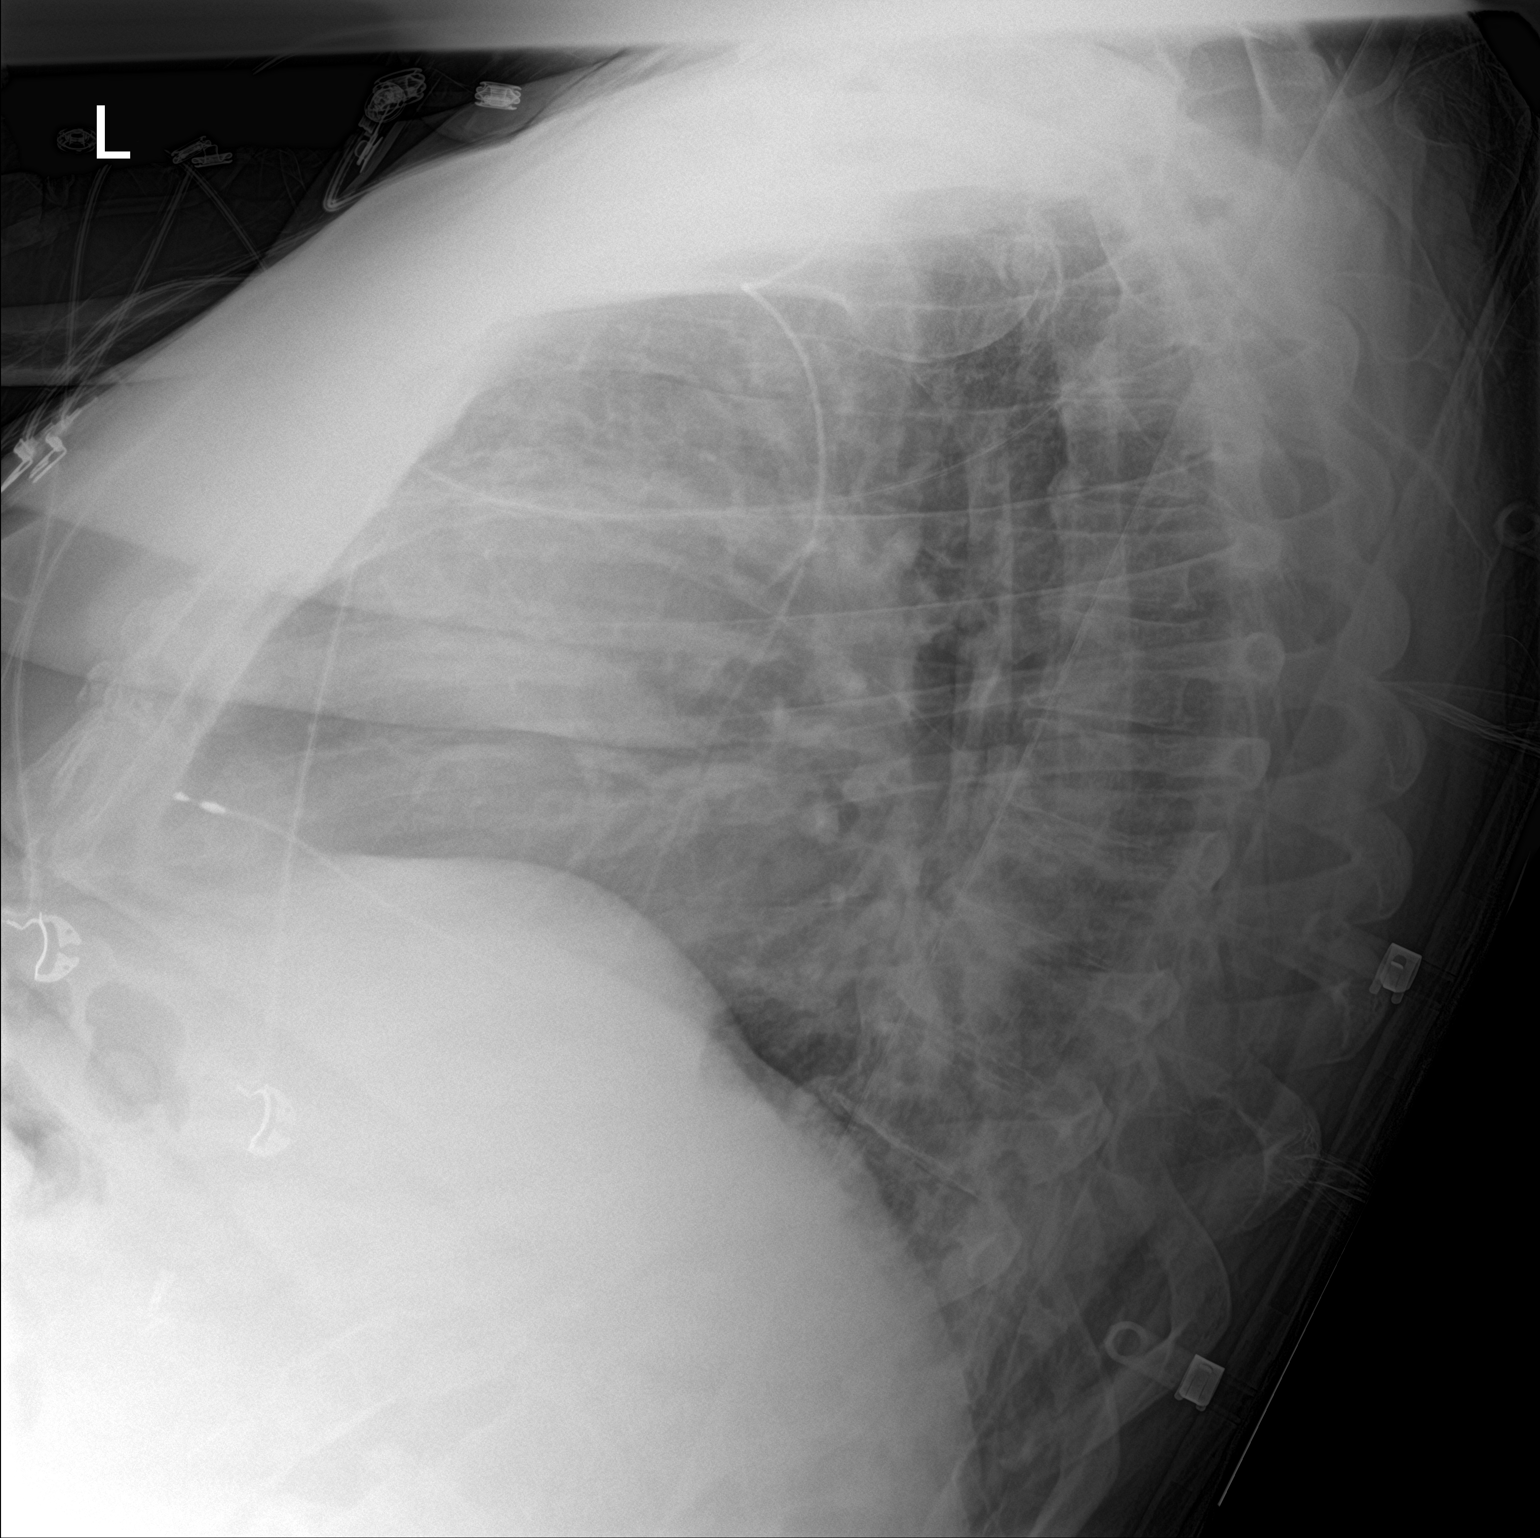

[chest ap]
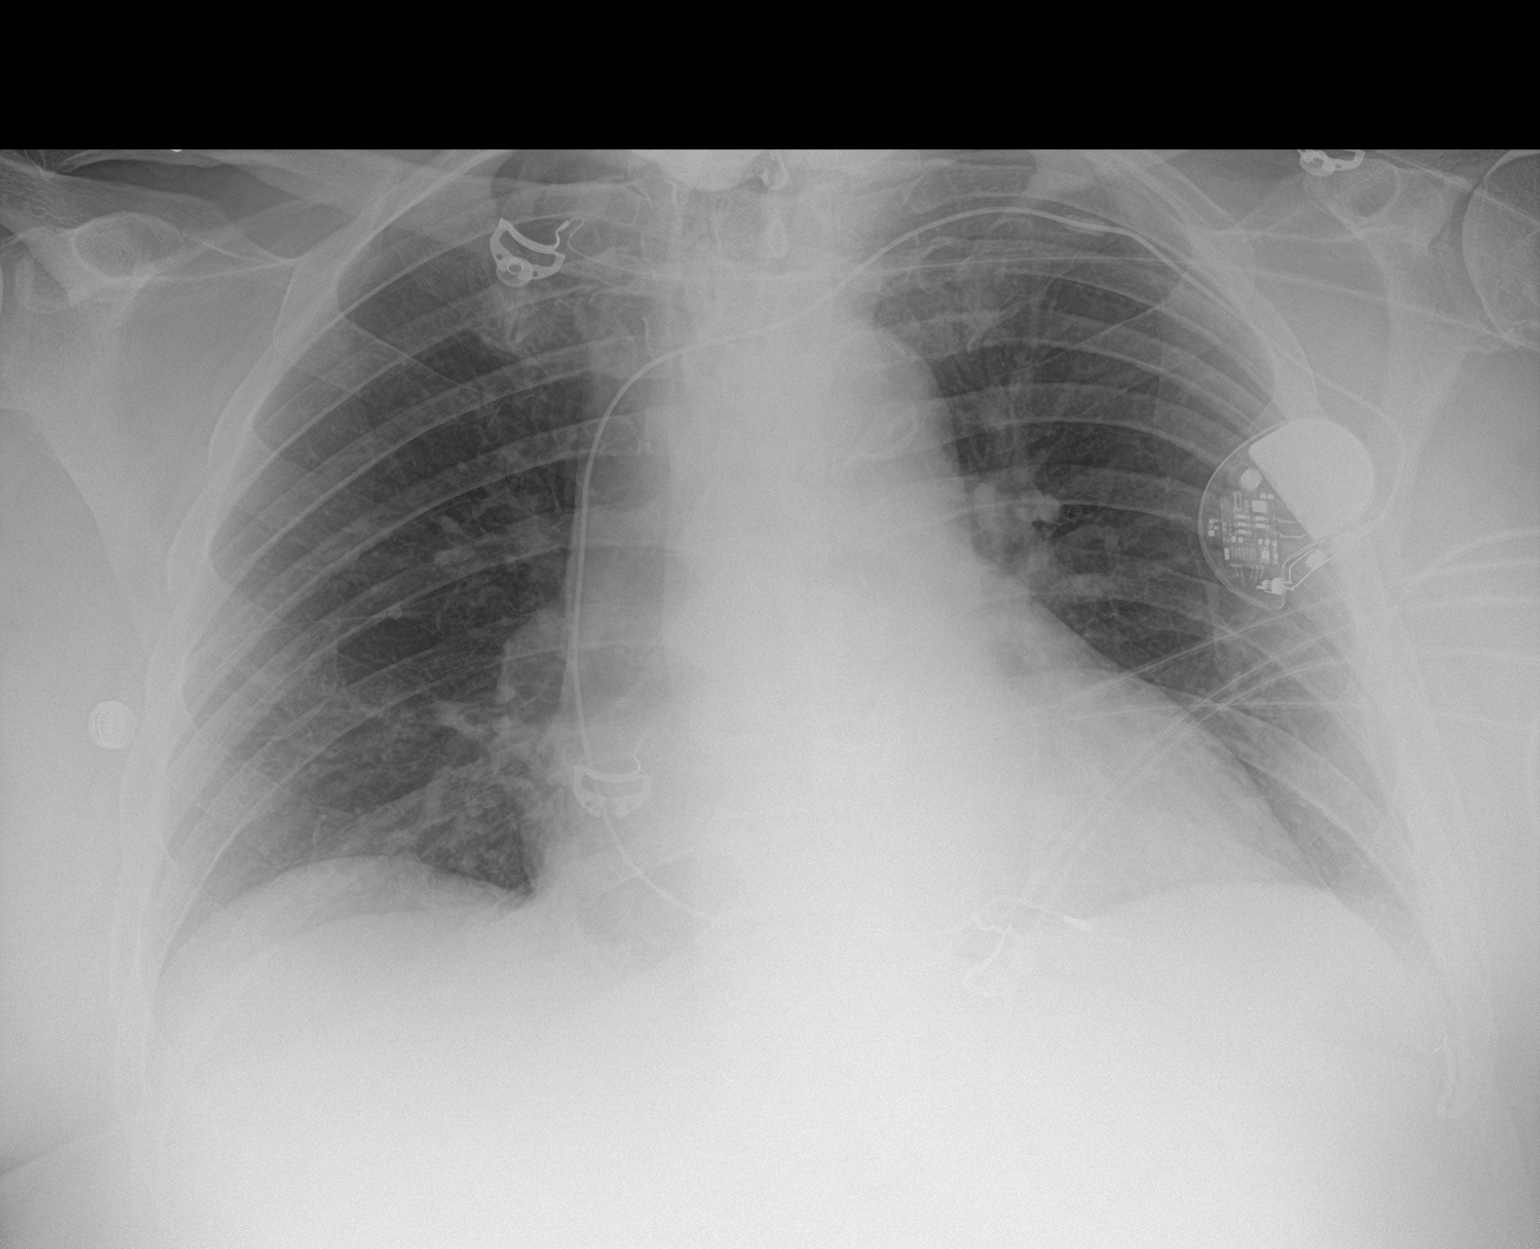

[2 of 2 positions shown; findings below may reference images not displayed]

FINDINGS: Left-sided pacemaker unchanged. Lungs are adequately inflated
without consolidation or effusion. Mild stable cardiomegaly.
Remainder of the exam is unchanged.
IMPRESSION: No acute findings.

Mild stable cardiomegaly.

## 2020-02-01 DIAGNOSIS — G8929 Other chronic pain: Secondary | ICD-10-CM | POA: Diagnosis not present

## 2020-02-01 DIAGNOSIS — Z7901 Long term (current) use of anticoagulants: Secondary | ICD-10-CM | POA: Diagnosis not present

## 2020-02-01 DIAGNOSIS — I10 Essential (primary) hypertension: Secondary | ICD-10-CM | POA: Diagnosis not present

## 2020-02-01 DIAGNOSIS — G4733 Obstructive sleep apnea (adult) (pediatric): Secondary | ICD-10-CM | POA: Diagnosis not present

## 2020-02-01 DIAGNOSIS — I482 Chronic atrial fibrillation, unspecified: Secondary | ICD-10-CM | POA: Diagnosis not present

## 2020-02-01 DIAGNOSIS — E785 Hyperlipidemia, unspecified: Secondary | ICD-10-CM | POA: Diagnosis not present

## 2020-02-01 DIAGNOSIS — G629 Polyneuropathy, unspecified: Secondary | ICD-10-CM | POA: Diagnosis not present

## 2020-02-25 ENCOUNTER — Ambulatory Visit: Payer: Medicare Other | Admitting: Orthopedic Surgery

## 2020-03-14 ENCOUNTER — Ambulatory Visit: Payer: Medicare Other | Admitting: Orthopedic Surgery

## 2020-03-25 DIAGNOSIS — G5791 Unspecified mononeuropathy of right lower limb: Secondary | ICD-10-CM | POA: Diagnosis not present

## 2020-03-25 DIAGNOSIS — M21371 Foot drop, right foot: Secondary | ICD-10-CM | POA: Diagnosis not present

## 2020-03-25 DIAGNOSIS — M21372 Foot drop, left foot: Secondary | ICD-10-CM | POA: Diagnosis not present

## 2020-03-25 DIAGNOSIS — R609 Edema, unspecified: Secondary | ICD-10-CM | POA: Diagnosis not present

## 2020-03-25 DIAGNOSIS — M79672 Pain in left foot: Secondary | ICD-10-CM | POA: Diagnosis not present

## 2020-03-25 DIAGNOSIS — M19072 Primary osteoarthritis, left ankle and foot: Secondary | ICD-10-CM | POA: Diagnosis not present

## 2020-03-25 DIAGNOSIS — G5792 Unspecified mononeuropathy of left lower limb: Secondary | ICD-10-CM | POA: Diagnosis not present

## 2020-03-25 DIAGNOSIS — M79671 Pain in right foot: Secondary | ICD-10-CM | POA: Diagnosis not present

## 2020-03-25 DIAGNOSIS — M19071 Primary osteoarthritis, right ankle and foot: Secondary | ICD-10-CM | POA: Diagnosis not present

## 2020-04-25 DIAGNOSIS — Z23 Encounter for immunization: Secondary | ICD-10-CM | POA: Diagnosis not present

## 2020-07-03 ENCOUNTER — Other Ambulatory Visit: Payer: Self-pay | Admitting: Cardiovascular Disease

## 2022-07-30 ENCOUNTER — Telehealth: Payer: Self-pay | Admitting: Orthopedic Surgery

## 2022-07-30 NOTE — Telephone Encounter (Signed)
Patient called in stating was unable to order his compression socks online like usual and he now needs a Rx for 2 layer compression socks size 2X  compression level unknown they have them at [facility] they stated he needs a Rx from his Dr. Please advise

## 2022-08-01 ENCOUNTER — Telehealth: Payer: Self-pay | Admitting: Orthopedic Surgery

## 2022-08-01 NOTE — Telephone Encounter (Signed)
Done

## 2022-08-01 NOTE — Telephone Encounter (Signed)
Called pt at number provided below. No answer, rang several times and then prompted to enter "your remote access code". Will try to reach him later.

## 2022-08-01 NOTE — Telephone Encounter (Signed)
Pt wife called back and left message stating that pt is all set. He doesn't need [place] to send in Rx for him.

## 2022-08-01 NOTE — Telephone Encounter (Signed)
noted 

## 2022-08-01 NOTE — Telephone Encounter (Signed)
Patient's wife called advised patient has solved the problem and do not need a call back.
